# Patient Record
Sex: Female | Born: 1975 | Race: Black or African American | Hispanic: No | Marital: Single | Smoking: Former smoker
Health system: Southern US, Community
[De-identification: ages and names within clinical notes are randomized; demographics above are authoritative.]

## PROBLEM LIST (undated history)

## (undated) DIAGNOSIS — J40 Bronchitis, not specified as acute or chronic: Secondary | ICD-10-CM

## (undated) DIAGNOSIS — E119 Type 2 diabetes mellitus without complications: Secondary | ICD-10-CM

## (undated) DIAGNOSIS — F329 Major depressive disorder, single episode, unspecified: Secondary | ICD-10-CM

## (undated) DIAGNOSIS — L0291 Cutaneous abscess, unspecified: Secondary | ICD-10-CM

## (undated) DIAGNOSIS — R519 Headache, unspecified: Secondary | ICD-10-CM

## (undated) DIAGNOSIS — M549 Dorsalgia, unspecified: Secondary | ICD-10-CM

## (undated) DIAGNOSIS — I10 Essential (primary) hypertension: Secondary | ICD-10-CM

## (undated) DIAGNOSIS — J189 Pneumonia, unspecified organism: Secondary | ICD-10-CM

## (undated) DIAGNOSIS — K219 Gastro-esophageal reflux disease without esophagitis: Secondary | ICD-10-CM

## (undated) DIAGNOSIS — R51 Headache: Secondary | ICD-10-CM

## (undated) DIAGNOSIS — F419 Anxiety disorder, unspecified: Secondary | ICD-10-CM

## (undated) DIAGNOSIS — E785 Hyperlipidemia, unspecified: Secondary | ICD-10-CM

## (undated) DIAGNOSIS — F32A Depression, unspecified: Secondary | ICD-10-CM

## (undated) DIAGNOSIS — E049 Nontoxic goiter, unspecified: Secondary | ICD-10-CM

## (undated) DIAGNOSIS — I739 Peripheral vascular disease, unspecified: Secondary | ICD-10-CM

## (undated) HISTORY — DX: Hyperlipidemia, unspecified: E78.5

---

## 2014-11-04 ENCOUNTER — Inpatient Hospital Stay (HOSPITAL_COMMUNITY)
Admission: EM | Admit: 2014-11-04 | Discharge: 2014-11-07 | DRG: 872 | Disposition: A | Discharge status: Home / Self Care | Payer: Medicaid Other | Attending: Family Medicine | Admitting: Family Medicine

## 2014-11-04 ENCOUNTER — Encounter (HOSPITAL_COMMUNITY): Payer: Self-pay | Admitting: *Deleted

## 2014-11-04 DIAGNOSIS — L03315 Cellulitis of perineum: Secondary | ICD-10-CM | POA: Diagnosis present

## 2014-11-04 DIAGNOSIS — Z8249 Family history of ischemic heart disease and other diseases of the circulatory system: Secondary | ICD-10-CM

## 2014-11-04 DIAGNOSIS — F17210 Nicotine dependence, cigarettes, uncomplicated: Secondary | ICD-10-CM | POA: Diagnosis present

## 2014-11-04 DIAGNOSIS — L0291 Cutaneous abscess, unspecified: Secondary | ICD-10-CM | POA: Diagnosis present

## 2014-11-04 DIAGNOSIS — E1165 Type 2 diabetes mellitus with hyperglycemia: Secondary | ICD-10-CM | POA: Diagnosis present

## 2014-11-04 DIAGNOSIS — A419 Sepsis, unspecified organism: Principal | ICD-10-CM | POA: Diagnosis present

## 2014-11-04 DIAGNOSIS — A599 Trichomoniasis, unspecified: Secondary | ICD-10-CM | POA: Diagnosis present

## 2014-11-04 DIAGNOSIS — E785 Hyperlipidemia, unspecified: Secondary | ICD-10-CM | POA: Diagnosis present

## 2014-11-04 DIAGNOSIS — I10 Essential (primary) hypertension: Secondary | ICD-10-CM | POA: Diagnosis present

## 2014-11-04 DIAGNOSIS — L02215 Cutaneous abscess of perineum: Secondary | ICD-10-CM | POA: Diagnosis present

## 2014-11-04 DIAGNOSIS — Z9114 Patient's other noncompliance with medication regimen: Secondary | ICD-10-CM | POA: Diagnosis present

## 2014-11-04 DIAGNOSIS — L0390 Cellulitis, unspecified: Secondary | ICD-10-CM

## 2014-11-04 DIAGNOSIS — E119 Type 2 diabetes mellitus without complications: Secondary | ICD-10-CM

## 2014-11-04 HISTORY — DX: Type 2 diabetes mellitus without complications: E11.9

## 2014-11-04 HISTORY — DX: Dorsalgia, unspecified: M54.9

## 2014-11-04 HISTORY — DX: Cutaneous abscess, unspecified: L02.91

## 2014-11-04 LAB — CBG MONITORING, ED: GLUCOSE-CAPILLARY: 379 mg/dL — AB (ref 65–99)

## 2014-11-04 MED ORDER — SODIUM CHLORIDE 0.9 % IV SOLN
2000.0000 mg | Freq: Once | INTRAVENOUS | Status: AC
  Administered 2014-11-05: 2000 mg via INTRAVENOUS
  Filled 2014-11-04: qty 2000

## 2014-11-04 MED ORDER — VANCOMYCIN HCL IN DEXTROSE 1-5 GM/200ML-% IV SOLN: 1000.0000 mg | Freq: Once | INTRAVENOUS | Status: DC

## 2014-11-04 MED ORDER — ACETAMINOPHEN 500 MG PO TABS
1000.0000 mg | ORAL_TABLET | Freq: Once | ORAL | Status: AC
  Administered 2014-11-05: 1000 mg via ORAL
  Filled 2014-11-04: qty 2

## 2014-11-04 MED ORDER — SODIUM CHLORIDE 0.9 % IV BOLUS (SEPSIS)
1000.0000 mL | INTRAVENOUS | Status: AC
  Administered 2014-11-05 (×4): 1000 mL via INTRAVENOUS

## 2014-11-04 MED ORDER — PIPERACILLIN-TAZOBACTAM 3.375 G IVPB 30 MIN
3.3750 g | Freq: Once | INTRAVENOUS | Status: AC
Start: 2014-11-04 — End: 2014-11-05
  Administered 2014-11-05: 3.375 g via INTRAVENOUS
  Filled 2014-11-04: qty 50

## 2014-11-04 MED ORDER — FENTANYL CITRATE (PF) 100 MCG/2ML IJ SOLN
50.0000 ug | Freq: Once | INTRAMUSCULAR | Status: AC
  Administered 2014-11-05: 50 ug via INTRAVENOUS
  Filled 2014-11-04: qty 2

## 2014-11-04 NOTE — ED Provider Notes (Signed)
TIME SEEN: 11:36 PM   CHIEF COMPLAINT: Fever, Abscess  HPI: Debra [name] is a 39 y.o. female with a PMHx of abscesses and NIDDM who presents to the Emergency Department complaining of fever and a worsening, painful abscess on the inside of her right buttock onset 1 week ago. She reports associated loss of appetite and a mild headache. Pt is currently on her period. She has had her previous abscess on the left buttock, which was drained at the time. She has just moved to the area and does not have a PCP.    ROS: See HPI Constitutional: fever  Eyes: no drainage  ENT: no runny nose   Cardiovascular:  no chest pain  Resp: no SOB  GI: no vomiting GU: no dysuria Integumentary: no rash  Allergy: no hives  Musculoskeletal: no leg swelling  Neurological: no slurred speech ROS otherwise negative  PAST MEDICAL HISTORY/PAST SURGICAL HISTORY:  Past Medical History  Diagnosis Date  . Diabetes mellitus without complication   . Back pain   . Abscess     MEDICATIONS:  Prior to Admission medications   Medication Sig Start Date End Date Taking? Authorizing Provider  albuterol (PROVENTIL HFA;VENTOLIN HFA) 108 (90 BASE) MCG/ACT inhaler Inhale 1-2 puffs into the lungs every 6 (six) hours as needed for wheezing or shortness of breath.   Yes Historical Provider, MD  lisinopril (PRINIVIL,ZESTRIL) 10 MG tablet Take 10 mg by mouth daily.   Yes Historical Provider, MD  metFORMIN (GLUCOPHAGE) 500 MG tablet Take 1,000 mg by mouth 2 (two) times daily with a meal.   Yes Historical Provider, MD  oxyCODONE-acetaminophen (PERCOCET) 10-325 MG per tablet Take 0.5-1 tablets by mouth every 4 (four) hours as needed for pain.   Yes Historical Provider, MD  rosuvastatin (CRESTOR) 20 MG tablet Take 20 mg by mouth daily.   Yes Historical Provider, MD    ALLERGIES:  No Known Allergies  SOCIAL HISTORY:  Social History  Substance Use Topics  . Smoking status: Current Every Day Smoker  . Smokeless tobacco: Never  Used  . Alcohol Use: No    FAMILY HISTORY: No family history on file.  EXAM: BP 135/64 mmHg  Pulse 99  Temp(Src) 99.7 F (37.6 C) (Oral)  Resp 18  Ht 6' (1.829 m)  Wt 263 lb 3.2 oz (119.387 kg)  BMI 35.69 kg/m2  SpO2 100%  LMP 11/03/2014 CONSTITUTIONAL: Alert and oriented and responds appropriately to questions. Well-appearing; well-nourished, obese HEAD: Normocephalic EYES: Conjunctivae clear, PERRL ENT: normal nose; no rhinorrhea; moist mucous membranes; pharynx without lesions noted NECK: Supple, no meningismus, no LAD  CARD: you are tachycardic; S1 and S2 appreciated; no murmurs, no clicks, no rubs, no gallops RESP: Normal chest excursion without splinting or tachypnea; breath sounds clear and equal bilaterally; no wheezes, no rhonchi, no rales, no hypoxia or respiratory distress, speaking full sentences ABD/GI: Normal bowel sounds; non-distended; soft, non-tender, no rebound, no guarding, no peritoneal signs GU:  Patient has a 7 x 5 cm area of induration, erythema and warmth noted to the right perineum with cellulitis that extends up the right buttock, she has a fluctuant area central to this with foul-smelling purulent drainage, abscess and cellulitis do not appear to extend inside the vagina or rectum, she has small amount of vaginal bleeding, and the cervical os with a small amount of yellow vaginal discharge, no adnexal tenderness or fullness, no cervical motion tenderness BACK:  The back appears normal and is non-tender to palpation, there is no CVA  tenderness EXT: Normal ROM in all joints; non-tender to palpation; no edema; normal capillary refill; no cyanosis, no calf tenderness or swelling    SKIN: Normal color for age and race; warm NEURO: Moves all extremities equally, sensation to light touch intact diffusely, cranial nerves II through XII intact PSYCH: The patient's mood and manner are appropriate. Grooming and personal hygiene are appropriate.  MEDICAL DECISION  MAKING: patient here with large abscess and cellulitis to the perineal area. No subcutaneous air. She is tachycardic and febrile. Rectal temp is 100.1. She is also hyperglycemic. Given patient meets sepsis criteria we'll start broad-spectrum antibiotics and IV fluids. Labs pending.  ED PROGRESS: patient's labs show leukocytosis with left shift. Blood glucose is 392 with normal bicarbonate 9. Lactate elevated at 3.08. Urine is positive for trichomonas. Patient has been given 2 g of Flagyl. Her i-STAT hCG is slightly positive. We'll obtain a serum quantitative hCG. Patient states she was last sexually active in June. She has had normal menstrual cycles over the past several months and feels that her vaginal bleeding is due to a normal menstrual cycle now.    Patient's beta hCG is slightly positive at 10. Because of this we will cancel patient CT scan. Discussed with patient that if she is to a pregnant she is likely miscarrying giving her very low hCG since her last sexual encounter was 3 months ago. At this time I do not feel she needs an emergent transvaginal ultrasound. Have sent swabs for gonorrhea and chlamydia given she is positive for trichomonas.  Doubt that she has necrotizing fasciitis. I have opened the area of drainage of the abscess with a scalpel and explored and opened this area further. Packing has been placed. Her heart rate and lactate have improved. Her pain is well controlled. Blood glucose is also improving with IV hydration and subcutaneous insulin. Discussed with hospitalist who agrees on admission. Given her vital signs have improved will admit to medical bed.     EKG Interpretation  Date/Time:  Sunday November 05 2014 00:05:27 EDT Ventricular Rate:  109 PR Interval:  139 QRS Duration: 82 QT Interval:  311 QTC Calculation: 419 R Axis:   88 Text Interpretation:  Sinus tachycardia Confirmed by WARD,  DO, KRISTEN ([id number]) on 11/05/2014 1:01:20 AM        CRITICAL  CARE Performed by: Raelyn Number   Total critical care time: 45 minutes  Critical care time was exclusive of separately billable procedures and treating other patients.  Critical care was necessary to treat or prevent imminent or life-threatening deterioration.  Critical care was time spent personally by me on the following activities: development of treatment plan with patient and/or surrogate as well as nursing, discussions with consultants, evaluation of patient's response to treatment, examination of patient, obtaining history from patient or surrogate, ordering and performing treatments and interventions, ordering and review of laboratory studies, ordering and review of radiographic studies, pulse oximetry and re-evaluation of patient's condition.     INCISION AND DRAINAGE Performed by: Raelyn Number Consent: Verbal consent obtained. Risks and benefits: risks, benefits and alternatives were discussed Type: abscess  Body area: right perineum  Anesthesia: local infiltration  Incision was made with a scalpel.  Local anesthetic: lidocaine 2% with epinephrine  Anesthetic total: 10 ml  Complexity: complex Blunt dissection to break up loculations  Drainage: purulent  Drainage amount: small  Packing material: 1/4 in iodoform gauze  Patient tolerance: Patient tolerated the procedure well with no immediate complications.  I personally performed the services described in this documentation, which was scribed in my presence. The recorded information has been reviewed and is accurate.   Layla Maw Ward, DO 11/05/14 [phone]

## 2014-11-04 NOTE — ED Notes (Signed)
Patient presents with fever and states she has an abcess on right right buttock.  Patient has a "sick" smell to her as she entered the room

## 2014-11-05 ENCOUNTER — Encounter (HOSPITAL_COMMUNITY): Payer: Self-pay | Admitting: *Deleted

## 2014-11-05 DIAGNOSIS — L0291 Cutaneous abscess, unspecified: Secondary | ICD-10-CM | POA: Diagnosis present

## 2014-11-05 DIAGNOSIS — R509 Fever, unspecified: Secondary | ICD-10-CM | POA: Diagnosis present

## 2014-11-05 DIAGNOSIS — L0390 Cellulitis, unspecified: Secondary | ICD-10-CM | POA: Diagnosis not present

## 2014-11-05 DIAGNOSIS — L02215 Cutaneous abscess of perineum: Secondary | ICD-10-CM | POA: Diagnosis present

## 2014-11-05 DIAGNOSIS — A599 Trichomoniasis, unspecified: Secondary | ICD-10-CM | POA: Diagnosis present

## 2014-11-05 DIAGNOSIS — A419 Sepsis, unspecified organism: Secondary | ICD-10-CM | POA: Diagnosis not present

## 2014-11-05 DIAGNOSIS — Z8249 Family history of ischemic heart disease and other diseases of the circulatory system: Secondary | ICD-10-CM | POA: Diagnosis not present

## 2014-11-05 DIAGNOSIS — L03315 Cellulitis of perineum: Secondary | ICD-10-CM | POA: Diagnosis present

## 2014-11-05 DIAGNOSIS — F17210 Nicotine dependence, cigarettes, uncomplicated: Secondary | ICD-10-CM | POA: Diagnosis present

## 2014-11-05 DIAGNOSIS — Z9114 Patient's other noncompliance with medication regimen: Secondary | ICD-10-CM | POA: Diagnosis present

## 2014-11-05 DIAGNOSIS — E119 Type 2 diabetes mellitus without complications: Secondary | ICD-10-CM

## 2014-11-05 DIAGNOSIS — E1165 Type 2 diabetes mellitus with hyperglycemia: Secondary | ICD-10-CM | POA: Diagnosis present

## 2014-11-05 DIAGNOSIS — I10 Essential (primary) hypertension: Secondary | ICD-10-CM | POA: Diagnosis present

## 2014-11-05 DIAGNOSIS — E785 Hyperlipidemia, unspecified: Secondary | ICD-10-CM | POA: Diagnosis present

## 2014-11-05 LAB — GLUCOSE, CAPILLARY
GLUCOSE-CAPILLARY: 346 mg/dL — AB (ref 65–99)
GLUCOSE-CAPILLARY: 235 mg/dL — AB (ref 65–99)
Glucose-Capillary: 246 mg/dL — ABNORMAL HIGH (ref 65–99)
Glucose-Capillary: 244 mg/dL — ABNORMAL HIGH (ref 65–99)

## 2014-11-05 LAB — CBC WITH DIFFERENTIAL/PLATELET
BASOS ABS: 0 10*3/uL (ref 0.0–0.1)
BASOS PCT: 0 % (ref 0–1)
EOS PCT: 0 % (ref 0–5)
Eosinophils Absolute: 0 10*3/uL (ref 0.0–0.7)
HCT: 36.9 % (ref 36.0–46.0)
Hemoglobin: 13 g/dL (ref 12.0–15.0)
LYMPHS PCT: 10 % — AB (ref 12–46)
Lymphs Abs: 1.7 10*3/uL (ref 0.7–4.0)
MCH: 31.7 pg (ref 26.0–34.0)
MCHC: 35.2 g/dL (ref 30.0–36.0)
MCV: 90 fL (ref 78.0–100.0)
MONO ABS: 0.9 10*3/uL (ref 0.1–1.0)
Monocytes Relative: 5 % (ref 3–12)
NEUTROS ABS: 13.6 10*3/uL — AB (ref 1.7–7.7)
Neutrophils Relative %: 85 % — ABNORMAL HIGH (ref 43–77)
PLATELETS: 350 10*3/uL (ref 150–400)
RBC: 4.1 MIL/uL (ref 3.87–5.11)
RDW: 12.2 % (ref 11.5–15.5)
WBC: 16.3 10*3/uL — AB (ref 4.0–10.5)

## 2014-11-05 LAB — WET PREP, GENITAL
CLUE CELLS WET PREP: NONE SEEN
YEAST WET PREP: NONE SEEN

## 2014-11-05 LAB — URINE MICROSCOPIC-ADD ON

## 2014-11-05 LAB — COMPREHENSIVE METABOLIC PANEL
ALBUMIN: 2.8 g/dL — AB (ref 3.5–5.0)
ALK PHOS: 121 U/L (ref 38–126)
ALT: 16 U/L (ref 14–54)
ANION GAP: 10 (ref 5–15)
AST: 16 U/L (ref 15–41)
BILIRUBIN TOTAL: 0.7 mg/dL (ref 0.3–1.2)
BUN: 8 mg/dL (ref 6–20)
CALCIUM: 9.3 mg/dL (ref 8.9–10.3)
CO2: 25 mmol/L (ref 22–32)
Chloride: 94 mmol/L — ABNORMAL LOW (ref 101–111)
Creatinine, Ser: 0.74 mg/dL (ref 0.44–1.00)
GFR calc Af Amer: >60 mL/min (ref >60)
GLUCOSE: 392 mg/dL — AB (ref 65–99)
POTASSIUM: 3.3 mmol/L — AB (ref 3.5–5.1)
Sodium: 129 mmol/L — ABNORMAL LOW (ref 135–145)
TOTAL PROTEIN: 7.1 g/dL (ref 6.5–8.1)

## 2014-11-05 LAB — I-STAT BETA HCG BLOOD, ED (MC, WL, AP ONLY): HCG, QUANTITATIVE: 25.7 m[IU]/mL — AB (ref <5)

## 2014-11-05 LAB — URINALYSIS, ROUTINE W REFLEX MICROSCOPIC
BILIRUBIN URINE: NEGATIVE
Glucose, UA: >1000 mg/dL — AB
KETONES UR: 40 mg/dL — AB
NITRITE: NEGATIVE
PROTEIN: NEGATIVE mg/dL
Specific Gravity, Urine: 1.041 — ABNORMAL HIGH (ref 1.005–1.030)
UROBILINOGEN UA: 1 mg/dL (ref 0.0–1.0)
pH: 5.5 (ref 5.0–8.0)

## 2014-11-05 LAB — I-STAT CG4 LACTIC ACID, ED
LACTIC ACID, VENOUS: 1.25 mmol/L (ref 0.5–2.0)
LACTIC ACID, VENOUS: 3.08 mmol/L — AB (ref 0.5–2.0)

## 2014-11-05 LAB — HCG, QUANTITATIVE, PREGNANCY: hCG, Beta Chain, Quant, S: 10 m[IU]/mL — ABNORMAL HIGH (ref <5)

## 2014-11-05 LAB — CBG MONITORING, ED: Glucose-Capillary: 269 mg/dL — ABNORMAL HIGH (ref 65–99)

## 2014-11-05 MED ORDER — VANCOMYCIN HCL IN DEXTROSE 1-5 GM/200ML-% IV SOLN
1000.0000 mg | Freq: Once | INTRAVENOUS | Status: AC
  Administered 2014-11-05: 1000 mg via INTRAVENOUS
  Filled 2014-11-05: qty 200

## 2014-11-05 MED ORDER — ENOXAPARIN SODIUM 40 MG/0.4ML ~~LOC~~ SOLN: 40.0000 mg | SUBCUTANEOUS | Status: DC

## 2014-11-05 MED ORDER — POTASSIUM CHLORIDE CRYS ER 20 MEQ PO TBCR
40.0000 meq | EXTENDED_RELEASE_TABLET | Freq: Once | ORAL | Status: AC
  Administered 2014-11-05: 40 meq via ORAL
  Filled 2014-11-05: qty 2

## 2014-11-05 MED ORDER — LIDOCAINE-EPINEPHRINE (PF) 2 %-1:200000 IJ SOLN
10.0000 mL | Freq: Once | INTRAMUSCULAR | Status: AC
  Administered 2014-11-05: 10 mL via INTRADERMAL
  Filled 2014-11-05: qty 20

## 2014-11-05 MED ORDER — ROSUVASTATIN CALCIUM 20 MG PO TABS
20.0000 mg | ORAL_TABLET | Freq: Every day | ORAL | Status: DC
  Administered 2014-11-05 – 2014-11-07 (×3): 20 mg via ORAL
  Filled 2014-11-05 (×4): qty 1

## 2014-11-05 MED ORDER — MORPHINE SULFATE (PF) 2 MG/ML IV SOLN
1.0000 mg | INTRAVENOUS | Status: DC | PRN
  Administered 2014-11-05 – 2014-11-07 (×6): 2 mg via INTRAVENOUS
  Filled 2014-11-05 (×6): qty 1

## 2014-11-05 MED ORDER — SODIUM CHLORIDE 0.9 % IV SOLN
INTRAVENOUS | Status: DC
  Administered 2014-11-05 – 2014-11-07 (×7): via INTRAVENOUS

## 2014-11-05 MED ORDER — ENSURE ENLIVE PO LIQD
237.0000 mL | Freq: Two times a day (BID) | ORAL | Status: DC
  Administered 2014-11-05 – 2014-11-06 (×2): 237 mL via ORAL

## 2014-11-05 MED ORDER — INSULIN ASPART 100 UNIT/ML ~~LOC~~ SOLN
0.0000 [IU] | Freq: Three times a day (TID) | SUBCUTANEOUS | Status: DC
  Administered 2014-11-05 (×2): 3 [IU] via SUBCUTANEOUS
  Administered 2014-11-05 – 2014-11-06 (×2): 9 [IU] via SUBCUTANEOUS
  Administered 2014-11-06: 5 [IU] via SUBCUTANEOUS
  Administered 2014-11-06: 3 [IU] via SUBCUTANEOUS
  Administered 2014-11-07: 5 [IU] via SUBCUTANEOUS
  Administered 2014-11-07: 3 [IU] via SUBCUTANEOUS

## 2014-11-05 MED ORDER — POTASSIUM CHLORIDE CRYS ER 20 MEQ PO TBCR
20.0000 meq | EXTENDED_RELEASE_TABLET | Freq: Once | ORAL | Status: AC
Start: 2014-11-05 — End: 2014-11-05
  Administered 2014-11-05: 20 meq via ORAL
  Filled 2014-11-05: qty 1

## 2014-11-05 MED ORDER — ALBUTEROL SULFATE (2.5 MG/3ML) 0.083% IN NEBU: 2.5000 mg | INHALATION_SOLUTION | Freq: Four times a day (QID) | RESPIRATORY_TRACT | Status: DC | PRN

## 2014-11-05 MED ORDER — ALBUTEROL SULFATE HFA 108 (90 BASE) MCG/ACT IN AERS: 1.0000 | INHALATION_SPRAY | Freq: Four times a day (QID) | RESPIRATORY_TRACT | Status: DC | PRN

## 2014-11-05 MED ORDER — MORPHINE SULFATE (PF) 4 MG/ML IV SOLN
4.0000 mg | Freq: Once | INTRAVENOUS | Status: AC
  Administered 2014-11-05: 4 mg via INTRAVENOUS
  Filled 2014-11-05: qty 1

## 2014-11-05 MED ORDER — METRONIDAZOLE 500 MG PO TABS
2000.0000 mg | ORAL_TABLET | Freq: Once | ORAL | Status: AC
  Administered 2014-11-05: 2000 mg via ORAL
  Filled 2014-11-05: qty 4

## 2014-11-05 MED ORDER — ENOXAPARIN SODIUM 60 MG/0.6ML ~~LOC~~ SOLN
0.5000 mg/kg | SUBCUTANEOUS | Status: DC
  Administered 2014-11-05 – 2014-11-07 (×3): 60 mg via SUBCUTANEOUS
  Filled 2014-11-05 (×3): qty 0.6

## 2014-11-05 MED ORDER — PIPERACILLIN-TAZOBACTAM 3.375 G IVPB
3.3750 g | Freq: Three times a day (TID) | INTRAVENOUS | Status: DC
  Administered 2014-11-05 – 2014-11-06 (×5): 3.375 g via INTRAVENOUS
  Filled 2014-11-05 (×6): qty 50

## 2014-11-05 MED ORDER — VANCOMYCIN HCL IN DEXTROSE 1-5 GM/200ML-% IV SOLN
1000.0000 mg | Freq: Three times a day (TID) | INTRAVENOUS | Status: DC
  Administered 2014-11-05 – 2014-11-06 (×3): 1000 mg via INTRAVENOUS
  Filled 2014-11-05 (×4): qty 200

## 2014-11-05 NOTE — H&P (Signed)
History and Physical  Debra [name] UJW:[id number] DOB: 08-15-1975 DOA: 11/04/2014  PCP: No primary care provider on file.   Chief Complaint:   History of Present Illness:  Patient is a 39 year old female with history of HTN and DM who came with cc of fever and pain/drianage in her abscess. This has been going on for a week but she has been pushing through and tolerating it to be able to take her daughter to school. She said there was pus and/or blood draining from the abscess. She was not sure as it coincided with her period. She has fever and chills. She had constipation but denied diarrhea. She had mild abdominal pain/cramping. She had had the abscess frequently with drainage eery 3=6 months.    Review of Systems:  CONSTITUTIONAL:  No night sweats.  No fatigue, malaise, lethargy.  fever or chills. Eyes:  No visual changes.  No eye pain.  No eye discharge.   ENT:    No epistaxis.  No sinus pain.  No sore throat.  No ear pain.  No congestion. RESPIRATORY:  No cough.  No wheeze.  No hemoptysis.  No shortness of breath. CARDIOVASCULAR:  No chest pains.  No palpitations. GASTROINTESTINAL:  No abdominal pain.  No nausea or vomiting.  No diarrhea . constipation.  No hematemesis.  No hematochezia.  No melena. GENITOURINARY:  No urgency.  No frequency.  No dysuria.  No hematuria.  No obstructive symptoms.  No discharge.  No pain.  No significant abnormal bleeding. MUSCULOSKELETAL:  No musculoskeletal pain.  No joint swelling.  No arthritis. NEUROLOGICAL:  No confusion.  No weakness. No headache. No seizure. PSYCHIATRIC:  No depression. No anxiety. No suicidal ideation. SKIN:  No rashes.  No lesions.  wounds. ENDOCRINE:  No unexplained weight loss.  No polydipsia.  No polyuria.  No polyphagia. HEMATOLOGIC:  No anemia.  No purpura.  No petechiae.  No bleeding.  ALLERGIC AND IMMUNOLOGIC:  No pruritus.  No swelling Other:  Past Medical and Surgical History:   Past Medical History    Diagnosis Date  . Diabetes mellitus without complication   . Back pain   . Abscess    History reviewed. No pertinent past surgical history.  Social History:   reports that she has been smoking.  She has never used smokeless tobacco. She reports that she does not drink alcohol or use illicit drugs.   No Known Allergies  FH: HTN  Prior to Admission medications   Medication Sig Start Date End Date Taking? Authorizing Provider  albuterol (PROVENTIL HFA;VENTOLIN HFA) 108 (90 BASE) MCG/ACT inhaler Inhale 1-2 puffs into the lungs every 6 (six) hours as needed for wheezing or shortness of breath.   Yes Historical Provider, MD  lisinopril (PRINIVIL,ZESTRIL) 10 MG tablet Take 10 mg by mouth daily.   Yes Historical Provider, MD  metFORMIN (GLUCOPHAGE) 500 MG tablet Take 1,000 mg by mouth 2 (two) times daily with a meal.   Yes Historical Provider, MD  oxyCODONE-acetaminophen (PERCOCET) 10-325 MG per tablet Take 0.5-1 tablets by mouth every 4 (four) hours as needed for pain.   Yes Historical Provider, MD  rosuvastatin (CRESTOR) 20 MG tablet Take 20 mg by mouth daily.   Yes Historical Provider, MD    Physical Exam: BP 132/67 mmHg  Pulse 102  Temp(Src) 99.7 F (37.6 C) (Oral)  Resp 25  Ht 6' (1.829 m)  Wt 119.387 kg (263 lb 3.2 oz)  BMI 35.69 kg/m2  SpO2 97%  LMP 11/03/2014  GENERAL : mild acute distress.  HEAD: normocephalic. EYES: PERRL, EOMI. Fundi normal, vision is grossly intact. NOSE: No nasal discharge. THROAT: Oral cavity and pharynx normal. No inflammation, swelling, exudate, or lesions.  NECK: Neck supple. CARDIAC: Normal S1 and S2. No S3, S4 or murmurs. Rhythm is regular. There is no peripheral edema, cyanosis or pallor. LUNGS: Clear to auscultation and percussion without rales, rhonchi, wheezing or diminished breath sounds. ABDOMEN: Positive bowel sounds. Soft, nondistended, nontender. No guarding or rebound. No masses. NEUROLOGICAL: The mental examination revealed the  patient was oriented to person, place, and time.CN II-XII intact. Strength and sensation symmetric and intact throughout. Reflexes 2+ throughout. Cerebellar testing normal. SKIN: right perineal abscess s/p drainage by ED physician  PSYCHIATRIC:  The patient was able to demonstrate good judgement and reason, without hallucinations, abnormal affect or abnormal behaviors during the examination. Patient is not suicidal.          Labs on Admission:  Reviewed.   Radiological Exams on Admission: No results found.    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

## 2014-11-05 NOTE — Progress Notes (Signed)
ANTIBIOTIC CONSULT NOTE - INITIAL  Pharmacy Consult for Vancomycin/Zosyn  Indication: Wound infection  No Known Allergies  Patient Measurements: Height: 6' (182.9 cm) Weight: 263 lb 3.2 oz (119.387 kg) IBW/kg (Calculated) : 73.1  Vital Signs: Temp: 99.7 F (37.6 C) (09/03 2137) Temp Source: Oral (09/03 2137) BP: 111/55 mmHg (09/04 0200) Pulse Rate: 99 (09/04 0200)  Labs:  Recent Labs  11/05/14 0045  WBC 16.3*  HGB 13.0  PLT 350  CREATININE 0.74   Estimated Creatinine Clearance: 137.9 mL/min (by C-G formula based on Cr of 0.74).  Medical History: Past Medical History  Diagnosis Date  . Diabetes mellitus without complication   . Back pain   . Abscess     Assessment: 39 y/o F with right buttock abscess, pt has hx of abscesses, WBC elevated, renal function good, other meds/labs reviewed.   Goal of Therapy:  Vancomycin trough level 15-20 mcg/ml  Plan:  -Vancomycin 1000 mg IV q8h -Zosyn 3.375G IV q8h to be infused over 4 hours -Trend WBC, temp, renal function  -Drug levels as indicated   Abran Duke 11/05/2014,2:12 AM

## 2014-11-05 NOTE — Progress Notes (Signed)
Patient seen and evaluated earlier the same by my associate. Please refer to H&P for details regarding assessment and plan.  Patient seen and evaluated Gen.: Patient in no acute distress, alert and awake Cardiovascular: S1 and S2 within normal limits, no rubs Pulmonary: No increased work of breathing, equal chest rise, no wheezes Peritoneum: Patient has abscess which has been packed. No purulent discharge noted  Will reassess patient next am. Continue broad spectrum antibiotics at this juncture. F/u with blood cultures  Basil Blakesley, Pamala Hurry

## 2014-11-05 NOTE — ED Notes (Signed)
CBG- 379 

## 2014-11-05 NOTE — Progress Notes (Signed)
Called pharmacy to push back the time for her Vancomycin because the zosyn will not be finished until 1918

## 2014-11-05 NOTE — Progress Notes (Signed)
Utilization Review Completed.Jarae Nemmers T9/06/2014  

## 2014-11-06 LAB — URINE CULTURE: Culture: 5000

## 2014-11-06 LAB — GLUCOSE, CAPILLARY
Glucose-Capillary: 266 mg/dL — ABNORMAL HIGH (ref 65–99)
Glucose-Capillary: 239 mg/dL — ABNORMAL HIGH (ref 65–99)
Glucose-Capillary: 371 mg/dL — ABNORMAL HIGH (ref 65–99)
Glucose-Capillary: 227 mg/dL — ABNORMAL HIGH (ref 65–99)

## 2014-11-06 MED ORDER — OXYCODONE HCL ER 15 MG PO T12A
15.0000 mg | EXTENDED_RELEASE_TABLET | Freq: Two times a day (BID) | ORAL | Status: DC
  Administered 2014-11-06 – 2014-11-07 (×2): 15 mg via ORAL
  Filled 2014-11-06 (×2): qty 1

## 2014-11-06 MED ORDER — ENSURE ENLIVE PO LIQD
237.0000 mL | Freq: Every day | ORAL | Status: DC
  Administered 2014-11-07: 237 mL via ORAL

## 2014-11-06 MED ORDER — SULFAMETHOXAZOLE-TRIMETHOPRIM 800-160 MG PO TABS
2.0000 | ORAL_TABLET | Freq: Two times a day (BID) | ORAL | Status: DC
  Administered 2014-11-06 – 2014-11-07 (×2): 2 via ORAL
  Filled 2014-11-06 (×2): qty 2

## 2014-11-06 MED ORDER — GLUCERNA SHAKE PO LIQD
237.0000 mL | Freq: Every day | ORAL | Status: DC
  Administered 2014-11-07: 237 mL via ORAL

## 2014-11-06 MED ORDER — SULFAMETHOXAZOLE-TRIMETHOPRIM 800-160 MG PO TABS: 1.0000 | ORAL_TABLET | Freq: Two times a day (BID) | ORAL | Status: DC

## 2014-11-06 NOTE — Progress Notes (Signed)
Inpatient Diabetes Program Recommendations  AACE/ADA: New Consensus Statement on Inpatient Glycemic Control (2013)  Target Ranges:  Prepandial:   less than 140 mg/dL      Peak postprandial:   less than 180 mg/dL (1-2 hours)      Critically ill patients:  140 - 180 mg/dL   GLYCEMIC CONTROL REVIEW - Consult  Diabetes history: DM2 Outpatient Diabetes medications: metformin 1000 mg bid Current orders for Inpatient glycemic control: Novolog sensitive tidwc  39 year old female with history of HTN and DM who came with cc of fever and pain/drianage in her abscess. CBGs running 200-300s. Awaiting HgbA1C results.  Results for Debra [name], Debra [name] (MRN [id number]) as of 11/06/2014 13:11  Ref. Range 11/05/2014 12:14 11/05/2014 16:58 11/05/2014 23:10 11/06/2014 07:39 11/06/2014 11:26  Glucose-Capillary Latest Ref Range: 65-99 mg/dL 086 (H) 578 (H) 469 (H) 266 (H) 239 (H)    Inpatient Diabetes Program Recommendations Insulin - Basal: Consider addition of Lantus 20 units QHS Correction (SSI): Increase Novolog to resistant tidwc and hs HgbA1C: Pending  Note: May want to reconsider addition of supplements d/t increased po intake and weight hx. No deficiencies noted.  Will continue to follow. Thank you. Ailene Ards, RD, LDN, CDE Inpatient Diabetes Coordinator [phone]

## 2014-11-06 NOTE — Progress Notes (Signed)
[facility] PROGRESS NOTE  Debra [name] NFA:[id number] DOB: May 01, 1975 DOA: 11/04/2014 PCP: No primary care provider on file.  Assessment/Plan: Principal Problem:   Abscess and cellulitis - s/p I and D - Was on Vanc and Zosyn initially - Will transition to Bactrim DS at higher dose given weight  Active Problems:   Diabetes - SSI    Sepsis - Resolving on antibiotics - given improvement in condition and resolution of sepsis physiology will transition to oral antibiotics  Code Status: full Family Communication: No family at bedside Disposition Plan: Pending improvement in condition.   Consultants:  none  Procedures:  I and D  Antibiotics:  Bactrim DS  HPI/Subjective: Pt has no new complaints. Feels better  Objective: Filed Vitals:   11/06/14 1345  BP: 118/62  Pulse: 90  Temp: 98.7 F (37.1 C)  Resp: 18    Intake/Output Summary (Last 24 hours) at 11/06/14 1641 Last data filed at 11/06/14 1500  Gross per 24 hour  Intake 4263.67 ml  Output   1450 ml  Net 2813.67 ml   Filed Weights   11/04/14 2142 11/05/14 0355  Weight: 119.387 kg (263 lb 3.2 oz) 121.11 kg (267 lb)    Exam:   General:  Pt in nad, alert and awake  Cardiovascular: rrr, no mrg  Respiratory: cta bl, no wheezes  Abdomen: soft, nt, nd  Musculoskeletal: no cyanosis or clubbing   Data Reviewed: Basic Metabolic Panel:  Recent Labs Lab 11/05/14 0045  NA 129*  K 3.3*  CL 94*  CO2 25  GLUCOSE 392*  BUN 8  CREATININE 0.74  CALCIUM 9.3   Liver Function Tests:  Recent Labs Lab 11/05/14 0045  AST 16  ALT 16  ALKPHOS 121  BILITOT 0.7  PROT 7.1  ALBUMIN 2.8*   No results for input(s): LIPASE, AMYLASE in the last 168 hours. No results for input(s): AMMONIA in the last 168 hours. CBC:  Recent Labs Lab 11/05/14 0045  WBC 16.3*  NEUTROABS 13.6*  HGB 13.0  HCT 36.9  MCV 90.0  PLT 350   Cardiac Enzymes: No results for input(s): CKTOTAL, CKMB, CKMBINDEX,  TROPONINI in the last 168 hours. BNP (last 3 results) No results for input(s): BNP in the last 8760 hours.  ProBNP (last 3 results) No results for input(s): PROBNP in the last 8760 hours.  CBG:  Recent Labs Lab 11/05/14 1214 11/05/14 1658 11/05/14 2310 11/06/14 0739 11/06/14 1126  GLUCAP 346* 244* 235* 266* 239*    Recent Results (from the past 240 hour(s))  Blood Culture (routine x 2)     Status: None (Preliminary result)   Collection Time: 11/04/14 11:55 PM  Result Value Ref Range Status   Specimen Description BLOOD RIGHT ARM  Final   Special Requests BOTTLES DRAWN AEROBIC AND ANAEROBIC 10CC   Final   Culture NO GROWTH 1 DAY  Final   Report Status PENDING  Incomplete  Blood Culture (routine x 2)     Status: None (Preliminary result)   Collection Time: 11/05/14 12:01 AM  Result Value Ref Range Status   Specimen Description BLOOD LEFT ARM  Final   Special Requests BOTTLES DRAWN AEROBIC AND ANAEROBIC 10CC  Final   Culture NO GROWTH 1 DAY  Final   Report Status PENDING  Incomplete  Urine culture     Status: None   Collection Time: 11/05/14 12:36 AM  Result Value Ref Range Status   Specimen Description URINE, CLEAN CATCH  Final   Special Requests NONE  Final   Culture 5,000 COLONIES/mL INSIGNIFICANT GROWTH  Final   Report Status 11/06/2014 FINAL  Final  Culture, routine-abscess     Status: None (Preliminary result)   Collection Time: 11/05/14 12:39 AM  Result Value Ref Range Status   Specimen Description ABSCESS RIGHT BUTTOCKS  Final   Special Requests NONE  Final   Gram Stain   Final    NO WBC SEEN NO SQUAMOUS EPITHELIAL CELLS SEEN MODERATE GRAM POSITIVE COCCI IN PAIRS FEW GRAM NEGATIVE RODS Performed at [facility]    Culture   Final    NO GROWTH 1 DAY Performed at [facility]    Report Status PENDING  Incomplete  Wet prep, genital     Status: Abnormal   Collection Time: 11/05/14  3:12 AM  Result Value Ref Range Status   Yeast Wet Prep HPF  POC NONE SEEN NONE SEEN Final   Trich, Wet Prep FEW (A) NONE SEEN Final   Clue Cells Wet Prep HPF POC NONE SEEN NONE SEEN Final   WBC, Wet Prep HPF POC MANY (A) NONE SEEN Final     Studies: No results found.  Scheduled Meds: . enoxaparin (LOVENOX) injection  0.5 mg/kg Subcutaneous Q24H  . [START ON 11/07/2014] feeding supplement (ENSURE ENLIVE)  237 mL Oral Q1500  . [START ON 11/07/2014] feeding supplement (GLUCERNA SHAKE)  237 mL Oral Daily  . insulin aspart  0-9 Units Subcutaneous TID WC  . OxyCODONE  15 mg Oral Q12H  . rosuvastatin  20 mg Oral Daily  . sulfamethoxazole-trimethoprim  2 tablet Oral Q12H   Continuous Infusions: . sodium chloride 125 mL/hr at 11/06/14 1617     Time spent: > 35 minutes    Penny Pia  [facility] Pager [phone]. If 7PM-7AM, please contact night-coverage at [email or web address], password [facility] 11/06/2014, 4:41 PM  LOS: 1 day

## 2014-11-06 NOTE — Progress Notes (Signed)
Initial Nutrition Assessment  DOCUMENTATION CODES:   Obesity unspecified  INTERVENTION:   Provide Ensure Enlive po once daily, each supplement provides 350 kcal and 20 grams of protein.  Provide Glucerna Shake po once daily, each supplement provides 220 kcal and 10 grams of protein  Encourage adequate PO intake.   NUTRITION DIAGNOSIS:   Increased nutrient needs related to  (healing) as evidenced by estimated needs.  GOAL:   Patient will meet greater than or equal to 90% of their needs  MONITOR:   PO intake, Supplement acceptance, Weight trends, Labs, I & O's  REASON FOR ASSESSMENT:   Consult  (weight loss)  ASSESSMENT:   39 year old female with history of HTN and DM who came with cc of fever and pain/drianage in her abscess. She said there was pus and/or blood draining from the abscess. She was not sure as it coincided with her period. She has fever and chills. She had constipation but denied diarrhea. She had mild abdominal pain/cramping.  Pt reports appetite has been improving since admission. Meal completion has been varied from 25-100%, with intake of 100% his AM. PTA pt reports having a lack of appetite for 1 week. Pt reports she would try to consume at least 3 meals a day however has only been snacking on foods. Pt reports weight loss, however is associated with healthier diet changes. Pt reports weight used to be ~420 lbs prior to being diagnosed with DM. Pt currently has Ensure ordered and reports she would like to continue with them. RD to modify orders.   Pt with no observed significant fat or muscle mass loss.   Labs and medications reviewed.   Diet Order:   Carb Modifed  Skin:  Reviewed, no issues  Last BM:  9/4  Height:   Ht Readings from Last 1 Encounters:  11/05/14 6' (1.829 m)    Weight:   Wt Readings from Last 1 Encounters:  11/05/14 267 lb (121.11 kg)    Ideal Body Weight:  75 kg  BMI:  Body mass index is 36.2 kg/(m^2).  Estimated  Nutritional Needs:   Kcal:  2000-2300  Protein:  125-140 lbs  Fluid:  2-2.3 L/day  EDUCATION NEEDS:   No education needs identified at this time  Roslyn Smiling, MS, RD, LDN Pager # [phone] After hours/ weekend pager # [phone]

## 2014-11-06 NOTE — Care Management Note (Signed)
Case Management Note  Patient Details  Name: Debra [name] MRN: [id number] Date of Birth: 07-18-1975  Subjective/Objective:                    Action/Plan:  Patient has moved to [facility] from [place] , has Medicaid through [place] . Patient states she does have prescription coverage in [place] , however, she goes to [place] twice a year to see her doctor . Patient is trying to get her Medicaid changed to [employer] . Gave patient information on [facility] and [facility] . Same closed today , unable to call to arrange hospital follow up , will attempt tomorrow , patient has contact information also .  Expected Discharge Date:                  Expected Discharge Plan:  Home/Self Care  In-House Referral:     Discharge planning Services  CM Consult  Post Acute Care Choice:    Choice offered to:     DME Arranged:    DME Agency:     HH Arranged:    HH Agency:     Status of Service:  In process, will continue to follow  Medicare Important Message Given:    Date Medicare IM Given:    Medicare IM give by:    Date Additional Medicare IM Given:    Additional Medicare Important Message give by:     If discussed at Long Length of Stay Meetings, dates discussed:    Additional Comments:  Kingsley Plan, RN 11/06/2014, 2:55 PM

## 2014-11-07 LAB — HEMOGLOBIN A1C
Hgb A1c MFr Bld: 11.2 % — ABNORMAL HIGH (ref 4.8–5.6)
Mean Plasma Glucose: 275 mg/dL

## 2014-11-07 LAB — GLUCOSE, CAPILLARY
GLUCOSE-CAPILLARY: 222 mg/dL — AB (ref 65–99)
Glucose-Capillary: 262 mg/dL — ABNORMAL HIGH (ref 65–99)

## 2014-11-07 LAB — CBC
HCT: 30.3 % — ABNORMAL LOW (ref 36.0–46.0)
HEMOGLOBIN: 10.4 g/dL — AB (ref 12.0–15.0)
MCH: 31.1 pg (ref 26.0–34.0)
MCHC: 34.3 g/dL (ref 30.0–36.0)
MCV: 90.7 fL (ref 78.0–100.0)
Platelets: 315 10*3/uL (ref 150–400)
RBC: 3.34 MIL/uL — ABNORMAL LOW (ref 3.87–5.11)
RDW: 12.5 % (ref 11.5–15.5)
WBC: 5.7 10*3/uL (ref 4.0–10.5)

## 2014-11-07 LAB — BASIC METABOLIC PANEL
Anion gap: 7 (ref 5–15)
BUN: 6 mg/dL (ref 6–20)
CALCIUM: 8.1 mg/dL — AB (ref 8.9–10.3)
CHLORIDE: 101 mmol/L (ref 101–111)
CO2: 25 mmol/L (ref 22–32)
CREATININE: 0.46 mg/dL (ref 0.44–1.00)
GFR calc Af Amer: >60 mL/min (ref >60)
GFR calc non Af Amer: >60 mL/min (ref >60)
Glucose, Bld: 249 mg/dL — ABNORMAL HIGH (ref 65–99)
Potassium: 3.7 mmol/L (ref 3.5–5.1)
SODIUM: 133 mmol/L — AB (ref 135–145)

## 2014-11-07 LAB — GC/CHLAMYDIA PROBE AMP (~~LOC~~) NOT AT ARMC
Chlamydia: NEGATIVE
Neisseria Gonorrhea: NEGATIVE

## 2014-11-07 MED ORDER — INSULIN ASPART 100 UNIT/ML ~~LOC~~ SOLN: 0.0000 [IU] | Freq: Every day | SUBCUTANEOUS | Status: DC

## 2014-11-07 MED ORDER — OXYCODONE HCL 5 MG PO TABS
5.0000 mg | ORAL_TABLET | Freq: Four times a day (QID) | ORAL | Status: DC | PRN
  Administered 2014-11-07: 5 mg via ORAL
  Filled 2014-11-07: qty 1

## 2014-11-07 MED ORDER — GLIPIZIDE 5 MG PO TABS: 5.0000 mg | ORAL_TABLET | Freq: Every day | ORAL | Status: DC

## 2014-11-07 MED ORDER — OXYCODONE-ACETAMINOPHEN 10-325 MG PO TABS: 1.0000 | ORAL_TABLET | ORAL | Status: DC | PRN

## 2014-11-07 MED ORDER — INSULIN ASPART 100 UNIT/ML ~~LOC~~ SOLN: 0.0000 [IU] | Freq: Three times a day (TID) | SUBCUTANEOUS | Status: DC

## 2014-11-07 MED ORDER — INSULIN GLARGINE 100 UNIT/ML ~~LOC~~ SOLN
20.0000 [IU] | Freq: Every day | SUBCUTANEOUS | Status: DC
  Filled 2014-11-07: qty 0.2

## 2014-11-07 MED ORDER — SULFAMETHOXAZOLE-TRIMETHOPRIM 800-160 MG PO TABS: 2.0000 | ORAL_TABLET | Freq: Two times a day (BID) | ORAL | Status: DC

## 2014-11-07 NOTE — Progress Notes (Signed)
Discussed discharge summary with patient. Reviewed all medications with patient. Patient received Rx. Patient ready for discharge. 

## 2014-11-07 NOTE — Discharge Summary (Signed)
Physician Discharge Summary  Debra [name] ZOX:[id number] DOB: 1975-03-21 DOA: 11/04/2014  PCP: No primary care provider on file.  Admit date: 11/04/2014 Discharge date: 11/07/2014  Time spent: > 35  minutes  Recommendations for Outpatient Follow-up:  1. Monitor blood sugars 2. Patient prefers to try oral hypoglycemic agents and diabetic diet. Offered SQ insulin but she refused 3. Treated for Trichomonas this hospital admission. 4. Patient will need routine post I&D care of abscess  Discharge Diagnoses:  Principal Problem:   Abscess and cellulitis Active Problems:   Diabetes   Sepsis   Discharge Condition: stable  Diet recommendation: Diabetic  Filed Weights   11/04/14 2142 11/05/14 0355  Weight: 119.387 kg (263 lb 3.2 oz) 121.11 kg (267 lb)    History of present illness:  From original history of present illness:  39 year old female with history of HTN and DM who came with cc of fever and pain/drianage in her abscess. This has been going on for a week but she has been pushing through and tolerating it to be able to take her daughter to school  Hospital Course:  Abscess -Status post I&D and packing in the ED. Patient should follow-up with her primary care physician for continued care. Patient verbalizes understanding and agreement. And she reports sure to have follow-up appointment. - We'll continue high-dose Bactrim based on her weight for the next 4 days to complete an 8 day treatment regimen - Provide prescription for Percocet on discharge for pain management  Trichomonas -Treated with Flagyl in the ED  Diabetes mellitus -Patient refuses subcutaneous insulin for treatment of blood sugars. Will discharge on metformin and add glipizide. Discussed that she needed to adhere to a diabetic diet. - Again recommend that she follow-up with her primary care physician for further monitoring and adjustment  Procedures:  As listed above  Consultations:  None  Discharge  Exam: Filed Vitals:   11/07/14 0949  BP: 116/63  Pulse: 73  Temp: 98.4 F (36.9 C)  Resp: 21    General: Patient in no acute distress, alert and awake Cardiovascular: Regular rate and rhythm, no murmurs or rubs Respiratory: Clear to auscultation bilaterally, no wheezes Skin: Patient has no purulent discharge from abscess which was I&D. Packing in place  Discharge Instructions   Discharge Instructions    Call MD for:  redness, tenderness, or signs of infection (pain, swelling, redness, odor or green/yellow discharge around incision site)    Complete by:  As directed      Call MD for:  temperature >100.4    Complete by:  As directed      Diet - low sodium heart healthy    Complete by:  As directed      Discharge instructions    Complete by:  As directed   Please follow up with your primary care physician for further evaluation of your recent abscess.     Increase activity slowly    Complete by:  As directed           Current Discharge Medication List    START taking these medications   Details  glipiZIDE (GLUCOTROL) 5 MG tablet Take 1 tablet (5 mg total) by mouth daily before breakfast. Qty: 30 tablet, Refills: 0    sulfamethoxazole-trimethoprim (BACTRIM DS,SEPTRA DS) 800-160 MG per tablet Take 2 tablets by mouth every 12 (twelve) hours. Qty: 16 tablet, Refills: 0      CONTINUE these medications which have CHANGED   Details  oxyCODONE-acetaminophen (PERCOCET) 10-325 MG per  tablet Take 1 tablet by mouth every 4 (four) hours as needed for pain. Qty: 30 tablet, Refills: 0      CONTINUE these medications which have NOT CHANGED   Details  albuterol (PROVENTIL HFA;VENTOLIN HFA) 108 (90 BASE) MCG/ACT inhaler Inhale 1-2 puffs into the lungs every 6 (six) hours as needed for wheezing or shortness of breath.    metFORMIN (GLUCOPHAGE) 500 MG tablet Take 1,000 mg by mouth 2 (two) times daily with a meal.    rosuvastatin (CRESTOR) 20 MG tablet Take 20 mg by mouth daily.       STOP taking these medications     lisinopril (PRINIVIL,ZESTRIL) 10 MG tablet        No Known Allergies Follow-up Information    Go to [facility]    .   Why:  Appointment November 14, 2014 Tuesday at 2 :30 pm    Contact information:   [address] [place] [address] [phone]       The results of significant diagnostics from this hospitalization (including imaging, microbiology, ancillary and laboratory) are listed below for reference.    Significant Diagnostic Studies: No results found.  Microbiology: Recent Results (from the past 240 hour(s))  Blood Culture (routine x 2)     Status: None (Preliminary result)   Collection Time: 11/04/14 11:55 PM  Result Value Ref Range Status   Specimen Description BLOOD RIGHT ARM  Final   Special Requests BOTTLES DRAWN AEROBIC AND ANAEROBIC 10CC   Final   Culture NO GROWTH 2 DAYS  Final   Report Status PENDING  Incomplete  Blood Culture (routine x 2)     Status: None (Preliminary result)   Collection Time: 11/05/14 12:01 AM  Result Value Ref Range Status   Specimen Description BLOOD LEFT ARM  Final   Special Requests BOTTLES DRAWN AEROBIC AND ANAEROBIC 10CC  Final   Culture NO GROWTH 2 DAYS  Final   Report Status PENDING  Incomplete  Urine culture     Status: None   Collection Time: 11/05/14 12:36 AM  Result Value Ref Range Status   Specimen Description URINE, CLEAN CATCH  Final   Special Requests NONE  Final   Culture 5,000 COLONIES/mL INSIGNIFICANT GROWTH  Final   Report Status 11/06/2014 FINAL  Final  Culture, routine-abscess     Status: None (Preliminary result)   Collection Time: 11/05/14 12:39 AM  Result Value Ref Range Status   Specimen Description ABSCESS RIGHT BUTTOCKS  Final   Special Requests NONE  Final   Gram Stain   Final    NO WBC SEEN NO SQUAMOUS EPITHELIAL CELLS SEEN MODERATE GRAM POSITIVE COCCI IN PAIRS FEW GRAM NEGATIVE RODS Performed at [facility]    Culture   Final    Culture reincubated for better growth Performed at [facility]    Report Status PENDING  Incomplete  Wet prep, genital     Status: Abnormal   Collection Time: 11/05/14  3:12 AM  Result Value Ref Range Status   Yeast Wet Prep HPF POC NONE SEEN NONE SEEN Final   Trich, Wet Prep FEW (A) NONE SEEN Final   Clue Cells Wet Prep HPF POC NONE SEEN NONE SEEN Final   WBC, Wet Prep HPF POC MANY (A) NONE SEEN Final     Labs: Basic Metabolic Panel:  Recent Labs Lab 11/05/14 0045 11/07/14 0005  NA 129* 133*  K 3.3* 3.7  CL 94* 101  CO2 25 25  GLUCOSE 392* 249*  BUN 8 6  CREATININE 0.74 0.46  CALCIUM 9.3 8.1*   Liver Function Tests:  Recent Labs Lab 11/05/14 0045  AST 16  ALT 16  ALKPHOS 121  BILITOT 0.7  PROT 7.1  ALBUMIN 2.8*   No results for input(s): LIPASE, AMYLASE in the last 168 hours. No results for input(s): AMMONIA in the last 168 hours. CBC:  Recent Labs Lab 11/05/14 0045 11/07/14 0005  WBC 16.3* 5.7  NEUTROABS 13.6*  --   HGB 13.0 10.4*  HCT 36.9 30.3*  MCV 90.0 90.7  PLT 350 315   Cardiac Enzymes: No results for input(s): CKTOTAL, CKMB, CKMBINDEX, TROPONINI in the last 168 hours. BNP: BNP (last 3 results) No results for input(s): BNP in the last 8760 hours.  ProBNP (last 3 results) No results for input(s): PROBNP in the last 8760 hours.  CBG:  Recent Labs Lab 11/06/14 1126 11/06/14 1830 11/06/14 2144 11/07/14 0742 11/07/14 1142  GLUCAP 239* 371* 227* 222* 262*     Signed:  Penny Pia  [facility] 11/07/2014, 4:30 PM

## 2014-11-07 NOTE — Care Management (Signed)
Patient has follow up appointment at [facility] November 14, 2014 at 2 30 pm . Patient aware and information on discharge instructions.  Ronny Flurry RN BSN [phone]

## 2014-11-09 LAB — CULTURE, ROUTINE-ABSCESS: Gram Stain: NONE SEEN

## 2014-11-10 LAB — CULTURE, BLOOD (ROUTINE X 2)
CULTURE: NO GROWTH
Culture: NO GROWTH

## 2014-11-14 ENCOUNTER — Other Ambulatory Visit: Payer: Self-pay | Admitting: *Deleted

## 2014-11-14 ENCOUNTER — Encounter: Payer: Self-pay | Admitting: Family Medicine

## 2014-11-14 ENCOUNTER — Ambulatory Visit: Payer: Medicaid Other | Attending: Family Medicine | Admitting: Family Medicine

## 2014-11-14 VITALS — BP 130/86 | HR 82 | Temp 99.1°F | Ht 72.0 in | Wt 254.0 lb

## 2014-11-14 DIAGNOSIS — L0390 Cellulitis, unspecified: Secondary | ICD-10-CM

## 2014-11-14 DIAGNOSIS — E1165 Type 2 diabetes mellitus with hyperglycemia: Secondary | ICD-10-CM

## 2014-11-14 DIAGNOSIS — L0291 Cutaneous abscess, unspecified: Secondary | ICD-10-CM | POA: Diagnosis not present

## 2014-11-14 DIAGNOSIS — R739 Hyperglycemia, unspecified: Secondary | ICD-10-CM

## 2014-11-14 LAB — POCT URINALYSIS DIPSTICK
Bilirubin, UA: NEGATIVE
GLUCOSE UA: 500
Ketones, UA: NEGATIVE
LEUKOCYTES UA: NEGATIVE
Nitrite, UA: NEGATIVE
Protein, UA: NEGATIVE
SPEC GRAV UA: 1.015
UROBILINOGEN UA: 0.2
pH, UA: 6.5

## 2014-11-14 LAB — GLUCOSE, POCT (MANUAL RESULT ENTRY): POC Glucose: 350 mg/dl — AB (ref 70–99)

## 2014-11-14 MED ORDER — GLUCOSE BLOOD VI STRP: 1.0000 | ORAL_STRIP | Freq: Three times a day (TID) | Status: DC

## 2014-11-14 MED ORDER — ACETAMINOPHEN-CODEINE #3 300-30 MG PO TABS: 1.0000 | ORAL_TABLET | Freq: Three times a day (TID) | ORAL | Status: DC | PRN

## 2014-11-14 MED ORDER — GLIPIZIDE 10 MG PO TABS: 10.0000 mg | ORAL_TABLET | Freq: Two times a day (BID) | ORAL | Status: DC

## 2014-11-14 MED ORDER — LISINOPRIL 5 MG PO TABS: 5.0000 mg | ORAL_TABLET | Freq: Every day | ORAL | Status: DC

## 2014-11-14 MED ORDER — ACCU-CHEK AVIVA DEVI: Status: DC

## 2014-11-14 MED ORDER — ACCU-CHEK SOFTCLIX LANCET DEV MISC: 1.0000 | Freq: Three times a day (TID) | Status: DC

## 2014-11-14 NOTE — Progress Notes (Signed)
CC: follow up from Hospitalization  HPI: Debra [name] is a 39 y.o. female here today for a follow up visit.  Patient has past medical history of uncontrolled type 2 diabetes mellitus (A1c 11.2 from 11/2014)  and has been out of her medications for the last 3 months ever since she relocated to [place] from [place].  She was recently hospitalized at [facility] from 11/04/14 to 11/07/14 after she had presented with fever and drainage from a buttock abscess. She had an incision and drainage of the abscess, culture was positive for staph aureus and she was placed on Vancomycin and Zosyn and subsequently transitioned to Bactrim for 8 days as well as Percocet for pain. During the course of the hospitalization she was seen by the diabetes coordinator as well as a Museum/gallery exhibitions officer; she was also treated for Trichomonas.  Today she reports she has run out of her Percocet and is in pain. Continues to comply with her metformin and glipizide for her diabetes and still refuses to go on insulin for better control. CBG is 350 in the clinic. Patient has No headache, No chest pain, No abdominal pain - No Nausea, No new weakness tingling or numbness, No Cough - SOB.  Allergies  Allergen Reactions  . Ibuprofen     Upset stomach   Past Medical History  Diagnosis Date  . Diabetes mellitus without complication   . Back pain   . Abscess   . Hyperlipidemia    Current Outpatient Prescriptions on File Prior to Visit  Medication Sig Dispense Refill  . albuterol (PROVENTIL HFA;VENTOLIN HFA) 108 (90 BASE) MCG/ACT inhaler Inhale 1-2 puffs into the lungs every 6 (six) hours as needed for wheezing or shortness of breath.    Marland Kitchen glipiZIDE (GLUCOTROL) 5 MG tablet Take 1 tablet (5 mg total) by mouth daily before breakfast. 30 tablet 0  . metFORMIN (GLUCOPHAGE) 500 MG tablet Take 1,000 mg by mouth 2 (two) times daily with a meal.    . sulfamethoxazole-trimethoprim (BACTRIM DS,SEPTRA DS) 800-160 MG per  tablet Take 2 tablets by mouth every 12 (twelve) hours. 16 tablet 0  . oxyCODONE-acetaminophen (PERCOCET) 10-325 MG per tablet Take 1 tablet by mouth every 4 (four) hours as needed for pain. (Patient not taking: Reported on 11/14/2014) 30 tablet 0  . rosuvastatin (CRESTOR) 20 MG tablet Take 20 mg by mouth daily.     No current facility-administered medications on file prior to visit.   Family History  Problem Relation Age of Onset  . Diabetes Mother   . Hypertension Mother   . Hyperlipidemia Mother   . Diabetes Father   . Heart disease Father    Social History   Social History  . Marital Status: Single    Spouse Name: N/A  . Number of Children: N/A  . Years of Education: N/A   Occupational History  . Not on file.   Social History Main Topics  . Smoking status: Current Every Day Smoker -- 0.25 packs/day for 16 years    Types: Cigarettes  . Smokeless tobacco: Never Used  . Alcohol Use: No  . Drug Use: No  . Sexual Activity: Not Currently    Birth Control/ Protection: None   Other Topics Concern  . Not on file   Social History Narrative    Review of Systems: Constitutional: Negative for fever, chills, diaphoresis, activity change, appetite change and fatigue. HENT: Negative for ear pain, nosebleeds, congestion, facial swelling, rhinorrhea, neck pain, neck stiffness and ear discharge.  Eyes: Negative for pain, discharge, redness, itching and visual disturbance. Respiratory: Negative for cough, choking, chest tightness, shortness of breath, wheezing and stridor.  Cardiovascular: Negative for chest pain, palpitations and leg swelling. Gastrointestinal: Negative for abdominal distention. Genitourinary: Negative for dysuria, urgency, frequency, hematuria, flank pain, decreased urine volume, difficulty urinating and dyspareunia.  Musculoskeletal: Negative for back pain, joint swelling, arthralgias and gait problem. Neurological: Negative for dizziness, tremors, seizures,  syncope, facial asymmetry, speech difficulty, weakness, light-headedness, numbness and headaches.  Hematological: Negative for adenopathy. Does not bruise/bleed easily. Skin: see hpi Psychiatric/Behavioral: Negative for hallucinations, behavioral problems, confusion, dysphoric mood, decreased concentration and agitation.    Objective: Filed Vitals:   11/14/14 1429  BP: 130/86  Pulse: 82  Temp: 99.1 F (37.3 C)  Height: 6' (1.829 m)  Weight: 254 lb (115.214 kg)  SpO2: 98%       Physical Exam: Constitutional: Patient appears well-developed and well-nourished. No distress. Neck: Normal ROM. Neck supple. No JVD. No tracheal deviation. No thyromegaly. CVS: RRR, S1/S2 +, no murmurs, no gallops, no carotid bruit.  Pulmonary: Effort and breath sounds normal, no stridor, rhonchi, wheezes, rales.  Abdominal: Soft. BS +,  no distension, tenderness, rebound or guarding.  Musculoskeletal: Normal range of motion. No edema and no tenderness.  Lymphadenopathy: No lymphadenopathy noted, cervical, inguinal or axillary Genitourinary: Abscess on the inferior aspect of right buttock with empty carvity and no discharge noted.  Neuro: Alert. Normal reflexes, muscle tone coordination. No cranial nerve deficit. Skin: Skin is warm and dry. No rash noted. Not diaphoretic. No erythema. No pallor. Psychiatric: Normal mood and affect. Behavior, judgment, thought content normal.  Lab Results  Component Value Date   WBC 5.7 11/07/2014   HGB 10.4* 11/07/2014   HCT 30.3* 11/07/2014   MCV 90.7 11/07/2014   PLT 315 11/07/2014   Lab Results  Component Value Date   CREATININE 0.46 11/07/2014   BUN 6 11/07/2014   NA 133* 11/07/2014   K 3.7 11/07/2014   CL 101 11/07/2014   CO2 25 11/07/2014    Lab Results  Component Value Date   HGBA1C 11.2* 11/05/2014        Assessment and plan:   Type 2 Dm: Uncontrolled with A1c of 11.2, CBG is 350. Increased Glipizide to 10mg  bid. Placed on ACEI for  reno-protection Refuses initiation of insulin. Prescription written for testing supplies and I will review her blood sugar log with her next office visit. Advised on ADA diet, weight loss and lifestyle changes.  Abscess of right buttock: Packed with iodoform and gauze applied over it Will reassess at next visit  This note has been created with Education officer, environmental. Any transcriptional errors are unintentional.     Jaclyn Shaggy, MD. [facility] [phone] 11/14/2014, 2:44 PM

## 2014-11-14 NOTE — Progress Notes (Signed)
Patient is here for follow up after I&D for abscess on right buttock Patient states she is using sanitary napkins, changing it 4x a day  She states there is copious amounts of drainage on the pad, brownish yellow in color and has noticed an odor Pain today 8/10, last oxycodone taken this morning and is asking for something for pain Patient reports taking all medications as prescribed  CBG 350 today Patient took metformin this morning and ate last meal around 1030AM

## 2014-11-14 NOTE — Patient Instructions (Signed)
Diabetes Mellitus and Food It is important for you to manage your blood sugar (glucose) level. Your blood glucose level can be greatly affected by what you eat. Eating healthier foods in the appropriate amounts throughout the day at about the same time each day will help you control your blood glucose level. It can also help slow or prevent worsening of your diabetes mellitus. Healthy eating may even help you improve the level of your blood pressure and reach or maintain a healthy weight.  HOW CAN FOOD AFFECT ME? Carbohydrates Carbohydrates affect your blood glucose level more than any other type of food. Your dietitian will help you determine how many carbohydrates to eat at each meal and teach you how to count carbohydrates. Counting carbohydrates is important to keep your blood glucose at a healthy level, especially if you are using insulin or taking certain medicines for diabetes mellitus. Alcohol Alcohol can cause sudden decreases in blood glucose (hypoglycemia), especially if you use insulin or take certain medicines for diabetes mellitus. Hypoglycemia can be a life-threatening condition. Symptoms of hypoglycemia (sleepiness, dizziness, and disorientation) are similar to symptoms of having too much alcohol.  If your health care provider has given you approval to drink alcohol, do so in moderation and use the following guidelines:  Women should not have more than one drink per day, and men should not have more than two drinks per day. One drink is equal to:  12 oz of beer.  5 oz of wine.  1 oz of hard liquor.  Do not drink on an empty stomach.  Keep yourself hydrated. Have water, diet soda, or unsweetened iced tea.  Regular soda, juice, and other mixers might contain a lot of carbohydrates and should be counted. WHAT FOODS ARE NOT RECOMMENDED? As you make food choices, it is important to remember that all foods are not the same. Some foods have fewer nutrients per serving than other  foods, even though they might have the same number of calories or carbohydrates. It is difficult to get your body what it needs when you eat foods with fewer nutrients. Examples of foods that you should avoid that are high in calories and carbohydrates but low in nutrients include:  Trans fats (most processed foods list trans fats on the Nutrition Facts label).  Regular soda.  Juice.  Candy.  Sweets, such as cake, pie, doughnuts, and cookies.  Fried foods. WHAT FOODS CAN I EAT? Have nutrient-rich foods, which will nourish your body and keep you healthy. The food you should eat also will depend on several factors, including:  The calories you need.  The medicines you take.  Your weight.  Your blood glucose level.  Your blood pressure level.  Your cholesterol level. You also should eat a variety of foods, including:  Protein, such as meat, poultry, fish, tofu, nuts, and seeds (lean animal proteins are best).  Fruits.  Vegetables.  Dairy products, such as milk, cheese, and yogurt (low fat is best).  Breads, grains, pasta, cereal, rice, and beans.  Fats such as olive oil, trans fat-free margarine, canola oil, avocado, and olives. DOES EVERYONE WITH DIABETES MELLITUS HAVE THE SAME MEAL PLAN? Because every person with diabetes mellitus is different, there is not one meal plan that works for everyone. It is very important that you meet with a dietitian who will help you create a meal plan that is just right for you. Document Released: 11/14/2004 Document Revised: 02/22/2013 Document Reviewed: 01/14/2013 Exit[employer] Patient Information 2015 [employer]. This   information is not intended to replace advice given to you by your health care provider. Make sure you discuss any questions you have with your health care provider.  

## 2014-11-23 ENCOUNTER — Ambulatory Visit: Payer: Medicaid Other | Admitting: Family Medicine

## 2014-11-23 ENCOUNTER — Encounter: Payer: Medicaid Other | Admitting: Pharmacist

## 2015-05-24 ENCOUNTER — Encounter (HOSPITAL_COMMUNITY): Payer: Medicaid Other

## 2015-05-24 ENCOUNTER — Encounter (HOSPITAL_COMMUNITY): Payer: Self-pay | Admitting: Emergency Medicine

## 2015-05-24 ENCOUNTER — Emergency Department (HOSPITAL_COMMUNITY)
Admission: EM | Admit: 2015-05-24 | Discharge: 2015-05-24 | Disposition: A | Discharge status: Home / Self Care | Payer: Medicaid Other | Attending: Emergency Medicine | Admitting: Emergency Medicine

## 2015-05-24 ENCOUNTER — Other Ambulatory Visit: Payer: Self-pay

## 2015-05-24 DIAGNOSIS — M79671 Pain in right foot: Secondary | ICD-10-CM | POA: Diagnosis not present

## 2015-05-24 DIAGNOSIS — F17210 Nicotine dependence, cigarettes, uncomplicated: Secondary | ICD-10-CM | POA: Diagnosis not present

## 2015-05-24 DIAGNOSIS — E785 Hyperlipidemia, unspecified: Secondary | ICD-10-CM | POA: Insufficient documentation

## 2015-05-24 DIAGNOSIS — Z792 Long term (current) use of antibiotics: Secondary | ICD-10-CM | POA: Insufficient documentation

## 2015-05-24 DIAGNOSIS — Z79899 Other long term (current) drug therapy: Secondary | ICD-10-CM | POA: Insufficient documentation

## 2015-05-24 DIAGNOSIS — Z872 Personal history of diseases of the skin and subcutaneous tissue: Secondary | ICD-10-CM | POA: Insufficient documentation

## 2015-05-24 DIAGNOSIS — Z7984 Long term (current) use of oral hypoglycemic drugs: Secondary | ICD-10-CM | POA: Diagnosis not present

## 2015-05-24 DIAGNOSIS — R202 Paresthesia of skin: Secondary | ICD-10-CM | POA: Diagnosis not present

## 2015-05-24 DIAGNOSIS — E119 Type 2 diabetes mellitus without complications: Secondary | ICD-10-CM | POA: Diagnosis not present

## 2015-05-24 DIAGNOSIS — R631 Polydipsia: Secondary | ICD-10-CM | POA: Insufficient documentation

## 2015-05-24 DIAGNOSIS — R358 Other polyuria: Secondary | ICD-10-CM | POA: Insufficient documentation

## 2015-05-24 LAB — I-STAT CHEM 8, ED
BUN: 8 mg/dL (ref 6–20)
CALCIUM ION: 1.16 mmol/L (ref 1.12–1.23)
CHLORIDE: 99 mmol/L — AB (ref 101–111)
CREATININE: 0.6 mg/dL (ref 0.44–1.00)
GLUCOSE: 203 mg/dL — AB (ref 65–99)
HCT: 41 % (ref 36.0–46.0)
Hemoglobin: 13.9 g/dL (ref 12.0–15.0)
POTASSIUM: 3.8 mmol/L (ref 3.5–5.1)
Sodium: 137 mmol/L (ref 135–145)
TCO2: 25 mmol/L (ref 0–100)

## 2015-05-24 LAB — CBC WITH DIFFERENTIAL/PLATELET
Basophils Absolute: 0 10*3/uL (ref 0.0–0.1)
Basophils Relative: 0 %
EOS ABS: 0.1 10*3/uL (ref 0.0–0.7)
EOS PCT: 1 %
HCT: 39.8 % (ref 36.0–46.0)
Hemoglobin: 13.6 g/dL (ref 12.0–15.0)
LYMPHS ABS: 3.4 10*3/uL (ref 0.7–4.0)
LYMPHS PCT: 43 %
MCH: 30.8 pg (ref 26.0–34.0)
MCHC: 34.2 g/dL (ref 30.0–36.0)
MCV: 90 fL (ref 78.0–100.0)
MONOS PCT: 6 %
Monocytes Absolute: 0.5 10*3/uL (ref 0.1–1.0)
Neutro Abs: 4 10*3/uL (ref 1.7–7.7)
Neutrophils Relative %: 50 %
Platelets: 377 10*3/uL (ref 150–400)
RBC: 4.42 MIL/uL (ref 3.87–5.11)
RDW: 12.5 % (ref 11.5–15.5)
WBC: 8 10*3/uL (ref 4.0–10.5)

## 2015-05-24 LAB — CBG MONITORING, ED: GLUCOSE-CAPILLARY: 314 mg/dL — AB (ref 65–99)

## 2015-05-24 MED ORDER — HYDROCODONE-ACETAMINOPHEN 5-325 MG PO TABS
2.0000 | ORAL_TABLET | Freq: Once | ORAL | Status: AC
Start: 2015-05-24 — End: 2015-05-24
  Administered 2015-05-24: 2 via ORAL
  Filled 2015-05-24: qty 2

## 2015-05-24 MED ORDER — SODIUM CHLORIDE 0.9 % IV BOLUS (SEPSIS)
1000.0000 mL | Freq: Once | INTRAVENOUS | Status: AC
  Administered 2015-05-24: 1000 mL via INTRAVENOUS

## 2015-05-24 NOTE — Consult Note (Signed)
[facility]  Patient name: Debra AgarStephanie [name] MRN: [id number] DOB: 02/12/1976 Sex: female  REASON FOR CONSULT: Both feet cold and right foot pain. Consult is from the emergency department.  HPI: Debra [name] is a 40 y.o. female, who has been [place] where it was snowy and cold. She noted that both feet were cold for the last week. Of note, she is somewhat of a poor historian. On further questioning she does describe some pain in her right foot but no symptoms on the left side. I do not get any classic history of claudication although she states that her entire right lower extremity hurt sometimes when she walks but also hurts when she lays on her side. She also describes some pain in her foot at night although this is relieved at times by lying on her side but also at times by hanging her foot down. This could possibly be consistent with rest pain. She denies any history of nonhealing ulcers.  Her risk factors for peripheral vascular disease include diabetes, hypertension, family history of premature cardiovascular disease, and a history of tobacco use. She denies any history of hypercholesterolemia.  Past Medical History  Diagnosis Date  . Diabetes mellitus without complication (HCC)   . Back pain   . Abscess   . Hyperlipidemia     Family History  Problem Relation Age of Onset  . Diabetes Mother   . Hypertension Mother   . Hyperlipidemia Mother   . Diabetes Father   . Heart disease Father   Her father had coronary disease in his 7850s.   SOCIAL HISTORY: She is single. She has 2 children. She smokes a third of a pack per day of cigarettes and has been smoking for 20 years. Social History   Social History  . Marital Status: Single    Spouse Name: N/A  . Number of Children: N/A  . Years of Education: N/A   Occupational History  . Not on file.   Social History Main Topics  . Smoking status: Current Every Day Smoker -- 0.25 packs/day for  16 years    Types: Cigarettes  . Smokeless tobacco: Never Used  . Alcohol Use: No  . Drug Use: No  . Sexual Activity: Not Currently    Birth Control/ Protection: None   Other Topics Concern  . Not on file   Social History Narrative    Allergies  Allergen Reactions  . Ibuprofen     Upset stomach    No current facility-administered medications for this encounter.   Current Outpatient Prescriptions  Medication Sig Dispense Refill  . aspirin 325 MG tablet Take 325 mg by mouth daily.    Marland Kitchen. atorvastatin (LIPITOR) 20 MG tablet Take 20 mg by mouth daily.    . Blood Glucose Monitoring Suppl (ACCU-CHEK AVIVA) device Use as instructed 1 each 0  . glimepiride (AMARYL) 2 MG tablet Take 2 mg by mouth daily with breakfast.    . glipiZIDE (GLUCOTROL) 10 MG tablet Take 1 tablet (10 mg total) by mouth 2 (two) times daily before a meal. (Patient taking differently: Take 5 mg by mouth 2 (two) times daily before a meal. ) 60 tablet 3  . glucose blood (ACCU-CHEK AVIVA) test strip 1 each by Other route 3 (three) times daily. 100 each 5  . Lancet Devices (ACCU-CHEK SOFTCLIX) lancets 1 each by Other route 3 (three) times daily. 1 each 0  . lisinopril (PRINIVIL,ZESTRIL) 5 MG tablet Take 1 tablet (5 mg  total) by mouth daily. (Patient taking differently: Take 10 mg by mouth daily. ) 30 tablet 3  . metFORMIN (GLUCOPHAGE) 500 MG tablet Take 1,000 mg by mouth 2 (two) times daily with a meal.    . acetaminophen-codeine (TYLENOL #3) 300-30 MG per tablet Take 1 tablet by mouth every 8 (eight) hours as needed for moderate pain. (Patient not taking: Reported on 05/24/2015) 30 tablet 0  . oxyCODONE-acetaminophen (PERCOCET) 10-325 MG per tablet Take 1 tablet by mouth every 4 (four) hours as needed for pain. (Patient not taking: Reported on 11/14/2014) 30 tablet 0  . sulfamethoxazole-trimethoprim (BACTRIM DS,SEPTRA DS) 800-160 MG per tablet Take 2 tablets by mouth every 12 (twelve) hours. (Patient not taking: Reported on  05/24/2015) 16 tablet 0    REVIEW OF SYSTEMS:   denotes positive finding,  denotes negative finding Cardiac  Comments:  Chest pain or chest pressure:    Shortness of breath upon exertion:    Short of breath when lying flat:    Irregular heart rhythm:        Vascular    Pain in calf, thigh, or hip brought on by ambulation: X The symptoms she describes are not classic for claudication.  Pain in feet at night that wakes you up from your sleep:     Blood clot in your veins:    Leg swelling:         Pulmonary    Oxygen at home:    Productive cough:     Wheezing:         Neurologic    Sudden weakness in arms or legs:     Sudden numbness in arms or legs:     Sudden onset of difficulty speaking or slurred speech:    Temporary loss of vision in one eye:     Problems with dizziness:  X       Gastrointestinal    Blood in stool:     Vomited blood:         Genitourinary    Burning when urinating:     Blood in urine:        Psychiatric    Major depression:         Hematologic    Bleeding problems:    Problems with blood clotting too easily:        Skin    Rashes or ulcers:        Constitutional    Fever or chills:      PHYSICAL EXAM: Filed Vitals:   05/24/15 1215 05/24/15 1230 05/24/15 1245 05/24/15 1300  BP: 109/73 119/72 102/39 110/59  Pulse: 73 72 70 71  Temp:      Resp:      Height:      Weight:      SpO2: 99% 100% 99% 99%    GENERAL: The patient is a well-nourished female, in no acute distress. The vital signs are documented above. CARDIAC: There is a regular rate and rhythm.  VASCULAR: I do not detect carotid bruits. On the right side, she has a palpable femoral pulse which is normal. I cannot palpate a popliteal pulse or pedal pulses. She does have a monophasic right peroneal and anterior tibial signal with Doppler. I cannot obtain a posterior tibial or dorsalis pedis signal on the right. On the left side, she has a normal femoral pulse. I cannot  palpate a popliteal pulse although she does have a palpable dorsalis pedis pulse. She has biphasic signals in  the left foot and the dorsalis pedis and posterior tibial positions. Both feet appear adequately perfused. PULMONARY: There is good air exchange bilaterally without wheezing or rales. ABDOMEN: Soft and non-tender with normal pitched bowel sounds. I cannot palpate an abdominal aortic aneurysm although her abdomen is difficult to assess because of her obesity. MUSCULOSKELETAL: There are no major deformities or cyanosis. NEUROLOGIC: No focal weakness or paresthesias are detected. SKIN: There are no ulcers or rashes noted. PSYCHIATRIC: The patient has a normal affect.  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 She has diabetes and continues to smoke. Given that she is having some pain in the right foot I have recommended that we schedule an arteriogram early next week to further assess this. I have reviewed with the patient the indications for arteriography. In addition, I have reviewed the potential complications of arteriography including but not limited to: Bleeding, arterial injury, arterial thrombosis, dye action, renal insufficiency, or other unpredictable medical problems. I have explained to the patient that if we find disease amenable to angioplasty we could potentially address this at the same time. I have discussed the potential complications of angioplasty and stenting, including but not limited to: Bleeding, arterial thrombosis, arterial injury, dissection, or the need for surgical intervention. I do not think there is any evidence of an acute arterial occlusion. I have discussed with her the importance of tobacco cessation. She will also begin an aspirin daily.  My office will call to arrange for an arteriogram Monday of next  week. I'll make further recommendations pending these results.  Waverly Ferrari [facility] [place]: [phone]

## 2015-05-24 NOTE — Discharge Instructions (Signed)
Today we saw you for your right foot pain. After evaluation, we believe you may have decreased blood flow to your foot due to your Diabetes and high cholesterol. Please make an appointment with Dr. Cari Carawayhris Dickson who is a vascular doctor and can look at the arteries in your legs.  Please start taking your Aspirin once a day, every day.  Additionally, your blood sugar was in the 300s today. Please talk to your family doctor about adjusting your medicines so your blood sugar is better controlled. Uncontrolled blood sugar will lead to more problems with your legs and can affect your heart as well. Please also talk to your family doctor for recommendations on proper diet for Diabetics.

## 2015-05-24 NOTE — ED Provider Notes (Signed)
The patient is a 40 year old female, she is a known diabetic for the last 5 years, she has had several years of treatment, she takes her medications regularly but endorses not eating a strict diabetic diet. She reports that over the last week she has had increasing pain in her right foot, this seems to be gradually worsening and is associated with a cold feeling of the foot and tingling. Strangely the patient reports that this is better when she ambulates. She denies chest pain shortness of breath or any other symptoms.  On exam the patient does have increased pain with palpation over the dorsum of the foot, pain with manipulating the right great toe, her capillary refill time is slowed however she does have approximately 3 seconds of capillary refill. The right foot is cool to touch compared to the left foot.  The left foot has normal pulses at the dorsalis pedis and posterior tibial arteries. I am unable to obtain pulses by Doppler of the right foot. At either location. Heart and lung exams are normal, the patient does not appear in distress. We'll obtain labs,   Labs reassuring other than CBG - I d/w Dr. Edilia Boickson who will see pt in consultation  Dr. Edilia Boickson has seen pt and has arranged the arteriogram and f/u in office, pt appears stable.  Medical screening examination/treatment/procedure(s) were conducted as a shared visit with non-physician practitioner(s) and myself.  I personally evaluated the patient during the encounter.  Clinical Impression:   Final diagnoses:  Right foot pain         Eber HongBrian Shanequa Whitenight, MD 05/26/15 [phone]

## 2015-05-24 NOTE — ED Provider Notes (Signed)
CSN: [id number]     Arrival date & time 05/24/15  1122 History   First MD Initiated Contact with Patient 05/24/15 1127     Chief Complaint  Patient presents with  . Foot Pain    Right   40 year old female presents with atraumatic right foot pain. PMHx significant for uncontrolled DM (BG normally in the 300 per pt), HTN, HLD. She states she was out of town last week when she suddenly began to experience the feeling of her both feet being cold. She states she soaked them in warm water which warmed them temporarily but the cold feeling came back. 1 week ago her right foot started to become painful. She reports the feeling of a sore tightness with tingling that extends from the anterior ankle and radiates to the great toe. She reports associated decreased ROM. Denies fever, chest pain, SOB, calf pain, difficulty ambulating, left foot pain.  The history is provided by the patient.    Past Medical History  Diagnosis Date  . Diabetes mellitus without complication (HCC)   . Back pain   . Abscess   . Hyperlipidemia    History reviewed. No pertinent past surgical history. Family History  Problem Relation Age of Onset  . Diabetes Mother   . Hypertension Mother   . Hyperlipidemia Mother   . Diabetes Father   . Heart disease Father    Social History  Substance Use Topics  . Smoking status: Current Every Day Smoker -- 0.25 packs/day for 16 years    Types: Cigarettes  . Smokeless tobacco: Never Used  . Alcohol Use: No   OB History    No data available     Review of Systems  Constitutional: Negative for fever.  Respiratory: Negative for shortness of breath.   Cardiovascular: Negative for chest pain.  Gastrointestinal: Negative for abdominal pain.  Endocrine: Positive for polydipsia and polyuria.  Genitourinary: Negative for dysuria.  Musculoskeletal: Positive for arthralgias. Negative for joint swelling and gait problem.  Skin: Negative for rash.  Neurological: Negative for  light-headedness.       Paresthesias      Allergies  Ibuprofen  Home Medications   Prior to Admission medications   Medication Sig Start Date End Date Taking? Authorizing Provider  acetaminophen-codeine (TYLENOL #3) 300-30 MG per tablet Take 1 tablet by mouth every 8 (eight) hours as needed for moderate pain. 11/14/14   Jaclyn Shaggy, MD  albuterol (PROVENTIL HFA;VENTOLIN HFA) 108 (90 BASE) MCG/ACT inhaler Inhale 1-2 puffs into the lungs every 6 (six) hours as needed for wheezing or shortness of breath.    Historical Provider, MD  atorvastatin (LIPITOR) 20 MG tablet Take 20 mg by mouth daily.    Historical Provider, MD  Blood Glucose Monitoring Suppl (ACCU-CHEK AVIVA) device Use as instructed 11/14/14 11/14/15  Jaclyn Shaggy, MD  glipiZIDE (GLUCOTROL) 10 MG tablet Take 1 tablet (10 mg total) by mouth 2 (two) times daily before a meal. 11/14/14   Jaclyn Shaggy, MD  glucose blood (ACCU-CHEK AVIVA) test strip 1 each by Other route 3 (three) times daily. 11/14/14   Jaclyn Shaggy, MD  Lancet Devices [facility]) lancets 1 each by Other route 3 (three) times daily. 11/14/14   Jaclyn Shaggy, MD  lisinopril (PRINIVIL,ZESTRIL) 5 MG tablet Take 1 tablet (5 mg total) by mouth daily. 11/14/14   Jaclyn Shaggy, MD  metFORMIN (GLUCOPHAGE) 500 MG tablet Take 1,000 mg by mouth 2 (two) times daily with a meal.    Historical Provider,  MD  oxyCODONE-acetaminophen (PERCOCET) 10-325 MG per tablet Take 1 tablet by mouth every 4 (four) hours as needed for pain. Patient not taking: Reported on 11/14/2014 11/07/14   Penny Piarlando Vega, MD  rosuvastatin (CRESTOR) 20 MG tablet Take 20 mg by mouth daily.    Historical Provider, MD  sulfamethoxazole-trimethoprim (BACTRIM DS,SEPTRA DS) 800-160 MG per tablet Take 2 tablets by mouth every 12 (twelve) hours. 11/07/14   Penny Piarlando Vega, MD   BP 129/65 mmHg  Pulse 82  Temp(Src) 97.7 F (36.5 C)  Resp 17  Ht 5\' 10"  (1.778 m)  Wt 120.203 kg  BMI 38.02 kg/m2  SpO2 100%  LMP  05/03/2015 Physical Exam  Constitutional: She is oriented to person, place, and time. She appears well-developed and well-nourished. No distress.  HENT:  Head: Normocephalic and atraumatic.  Eyes: Pupils are equal, round, and reactive to light.  Cardiovascular: Normal rate and regular rhythm.  Exam reveals no gallop and no friction rub.   No murmur heard. Pulses:      Dorsalis pedis pulses are 0 on the right side, and 2+ on the left side.       Posterior tibial pulses are 0 on the right side, and 2+ on the left side.  Unable to palpate DP, PT pulses on the right side. Doppler revealed pulses on the left side but unable to locate pulses on the right side  Pulmonary/Chest: Effort normal and breath sounds normal. No respiratory distress. She has no wheezes. She has no rales.  Musculoskeletal:  Right ankle and foot: No rashes, open wounds, erythema. Ankle, dorsum of foot, and 1st metatarsal is tender to palpation. Medial, lateral and 2-5th metatarsals are non-tender. Cap refill is 3 sec. FROM. Pain with dorsiflexion and inversion of ankle. FROM of all toes. Toes feel cool to touch when compared to L foot.   Neurological: She is alert and oriented to person, place, and time.  Skin: Skin is warm and dry.  Psychiatric: She has a normal mood and affect.    ED Course  Procedures (including critical care time) Labs Review Labs Reviewed  CBG MONITORING, ED - Abnormal; Notable for the following:    Glucose-Capillary 314 (*)    All other components within normal limits  I-STAT CHEM 8, ED - Abnormal; Notable for the following:    Chloride 99 (*)    Glucose, Bld 203 (*)    All other components within normal limits  CBC WITH DIFFERENTIAL/PLATELET     MDM   Final diagnoses:  Right foot pain   Pt is a 40 year old female with insidious onset of right foot pain and poikilothermia. Although she has undetectable distal pulses in the right foot and pain, there are no signs of acute arterial  occlusion. There is delayed perfusion with intact ROM and no signs of an ischemic extremity. Due to her risk factors for PAD, Vascular was consulted who recommended outpatient f/u on Monday. Also recommended ASA 325mg  daily, pt informed.  Also of note was her POC BG of 350+. 1L NS given. Pt admittedly does not follow an ADA diet. Advised to f/u with PCP for possible med adjustment. Unlikely her foot pain is due to neuropathy due to it being unilateral and having signs of decreased blood flow to her right foot.  Patient informed of clinical course, understand medical decision-making process, and agrees with plan.  Bethel BornKelly Marie Anaeli Cornwall, PA-C 05/24/15 1435  Eber HongBrian Miller, MD 05/26/15 [phone]

## 2015-05-24 NOTE — ED Notes (Signed)
EMS - Patient coming from home with c/o of ongoing x 1 week with worsening right foot pain.  Patient is tender to the touch on the right foot with swelling and mild bruising.  Patient describes the pain as "razors and bumble bees playing with her toes".  Pain is 10/10.  Patient took Aspirin for the pain with no relief.

## 2015-05-24 NOTE — ED Notes (Signed)
Patient alert and oriented at discharge.  Patient verbalized understanding of discharge instructions.   

## 2015-05-28 ENCOUNTER — Other Ambulatory Visit: Payer: Self-pay | Admitting: *Deleted

## 2015-05-28 ENCOUNTER — Ambulatory Visit (HOSPITAL_COMMUNITY)
Admission: RE | Admit: 2015-05-28 | Discharge: 2015-05-28 | Disposition: A | Discharge status: Home / Self Care | Payer: Medicaid Other | Source: Ambulatory Visit | Attending: Vascular Surgery | Admitting: Vascular Surgery

## 2015-05-28 ENCOUNTER — Encounter (HOSPITAL_COMMUNITY)
Admission: RE | Disposition: A | Discharge status: Home / Self Care | Payer: Self-pay | Source: Ambulatory Visit | Attending: Vascular Surgery

## 2015-05-28 ENCOUNTER — Encounter (HOSPITAL_COMMUNITY): Payer: Self-pay | Admitting: Vascular Surgery

## 2015-05-28 DIAGNOSIS — I70201 Unspecified atherosclerosis of native arteries of extremities, right leg: Secondary | ICD-10-CM | POA: Diagnosis present

## 2015-05-28 DIAGNOSIS — E1151 Type 2 diabetes mellitus with diabetic peripheral angiopathy without gangrene: Secondary | ICD-10-CM | POA: Insufficient documentation

## 2015-05-28 DIAGNOSIS — Z7984 Long term (current) use of oral hypoglycemic drugs: Secondary | ICD-10-CM | POA: Insufficient documentation

## 2015-05-28 DIAGNOSIS — M79671 Pain in right foot: Secondary | ICD-10-CM | POA: Insufficient documentation

## 2015-05-28 DIAGNOSIS — Z9862 Peripheral vascular angioplasty status: Secondary | ICD-10-CM

## 2015-05-28 DIAGNOSIS — I10 Essential (primary) hypertension: Secondary | ICD-10-CM | POA: Insufficient documentation

## 2015-05-28 DIAGNOSIS — F17210 Nicotine dependence, cigarettes, uncomplicated: Secondary | ICD-10-CM | POA: Diagnosis not present

## 2015-05-28 DIAGNOSIS — E785 Hyperlipidemia, unspecified: Secondary | ICD-10-CM | POA: Diagnosis not present

## 2015-05-28 DIAGNOSIS — Z7982 Long term (current) use of aspirin: Secondary | ICD-10-CM | POA: Insufficient documentation

## 2015-05-28 DIAGNOSIS — I739 Peripheral vascular disease, unspecified: Secondary | ICD-10-CM

## 2015-05-28 DIAGNOSIS — I70209 Unspecified atherosclerosis of native arteries of extremities, unspecified extremity: Secondary | ICD-10-CM

## 2015-05-28 DIAGNOSIS — Z8249 Family history of ischemic heart disease and other diseases of the circulatory system: Secondary | ICD-10-CM | POA: Insufficient documentation

## 2015-05-28 HISTORY — PX: PERIPHERAL VASCULAR CATHETERIZATION: SHX172C

## 2015-05-28 LAB — POCT I-STAT, CHEM 8
BUN: 9 mg/dL (ref 6–20)
Calcium, Ion: 1.15 mmol/L (ref 1.12–1.23)
Chloride: 101 mmol/L (ref 101–111)
Creatinine, Ser: 0.5 mg/dL (ref 0.44–1.00)
Glucose, Bld: 263 mg/dL — ABNORMAL HIGH (ref 65–99)
HEMATOCRIT: 44 % (ref 36.0–46.0)
HEMOGLOBIN: 15 g/dL (ref 12.0–15.0)
POTASSIUM: 4 mmol/L (ref 3.5–5.1)
SODIUM: 137 mmol/L (ref 135–145)
TCO2: 25 mmol/L (ref 0–100)

## 2015-05-28 LAB — GLUCOSE, CAPILLARY: GLUCOSE-CAPILLARY: 197 mg/dL — AB (ref 65–99)

## 2015-05-28 LAB — PREGNANCY, URINE: Preg Test, Ur: NEGATIVE

## 2015-05-28 SURGERY — ABDOMINAL AORTOGRAM
Anesthesia: LOCAL

## 2015-05-28 MED ORDER — FENTANYL CITRATE (PF) 100 MCG/2ML IJ SOLN
INTRAMUSCULAR | Status: AC
  Filled 2015-05-28: qty 2

## 2015-05-28 MED ORDER — LIDOCAINE HCL (PF) 1 % IJ SOLN
INTRAMUSCULAR | Status: DC | PRN
  Administered 2015-05-28: 15 mL

## 2015-05-28 MED ORDER — MIDAZOLAM HCL 2 MG/2ML IJ SOLN
INTRAMUSCULAR | Status: AC
  Filled 2015-05-28: qty 2

## 2015-05-28 MED ORDER — LIDOCAINE HCL (PF) 1 % IJ SOLN
INTRAMUSCULAR | Status: AC
  Filled 2015-05-28: qty 30

## 2015-05-28 MED ORDER — OXYCODONE-ACETAMINOPHEN 5-325 MG PO TABS
ORAL_TABLET | ORAL | Status: AC
  Administered 2015-05-28: 1 via ORAL
  Filled 2015-05-28: qty 1

## 2015-05-28 MED ORDER — OXYCODONE-ACETAMINOPHEN 5-325 MG PO TABS
1.0000 | ORAL_TABLET | Freq: Once | ORAL | Status: AC
  Administered 2015-05-28: 1 via ORAL

## 2015-05-28 MED ORDER — HEPARIN (PORCINE) IN NACL 2-0.9 UNIT/ML-% IJ SOLN
INTRAMUSCULAR | Status: DC | PRN
  Administered 2015-05-28: 1000 mL via INTRA_ARTERIAL

## 2015-05-28 MED ORDER — MIDAZOLAM HCL 2 MG/2ML IJ SOLN
INTRAMUSCULAR | Status: DC | PRN
  Administered 2015-05-28: 1 mg via INTRAVENOUS

## 2015-05-28 MED ORDER — SODIUM CHLORIDE 0.9 % IV SOLN: 1.0000 mL/kg/h | INTRAVENOUS | Status: DC

## 2015-05-28 MED ORDER — SODIUM CHLORIDE 0.9 % IV SOLN
INTRAVENOUS | Status: DC
  Administered 2015-05-28: 07:00:00 via INTRAVENOUS

## 2015-05-28 MED ORDER — HEPARIN (PORCINE) IN NACL 2-0.9 UNIT/ML-% IJ SOLN
INTRAMUSCULAR | Status: AC
  Filled 2015-05-28: qty 1000

## 2015-05-28 MED ORDER — FENTANYL CITRATE (PF) 100 MCG/2ML IJ SOLN
INTRAMUSCULAR | Status: DC | PRN
  Administered 2015-05-28: 50 ug via INTRAVENOUS

## 2015-05-28 SURGICAL SUPPLY — 11 items
CATH ANGIO 5F PIGTAIL 65CM (CATHETERS) ×2 IMPLANT
CATH CROSS OVER TEMPO 5F (CATHETERS) ×2 IMPLANT
CATH STRAIGHT 5FR 65CM (CATHETERS) ×2 IMPLANT
COVER DOME SNAP 22 D (MISCELLANEOUS) ×2 IMPLANT
KIT MICROINTRODUCER STIFF 5F (SHEATH) ×2 IMPLANT
KIT PV (KITS) ×2 IMPLANT
SHEATH PINNACLE 5F 10CM (SHEATH) ×2 IMPLANT
SYR MEDRAD MARK V 150ML (SYRINGE) ×2 IMPLANT
TRANSDUCER W/STOPCOCK (MISCELLANEOUS) ×2 IMPLANT
TRAY PV CATH (CUSTOM PROCEDURE TRAY) ×2 IMPLANT
WIRE BENTSON .035X145CM (WIRE) ×2 IMPLANT

## 2015-05-28 NOTE — Op Note (Signed)
PATIENT: Debra [name]  MRN: [id number] DOB: June 11, 1975    DATE OF PROCEDURE: 05/28/2015  INDICATIONS: Debra [name] is a 40 y.o. female whom I saw in consultation in the emergency department on 05/24/2015 with cold feet bilaterally. She had evidence of infrainguinal arterial occlusive disease and presents for diagnostic arteriography.  PROCEDURE:  1. Ultrasound-guided access to the left common femoral artery 2. Aortogram with bilateral iliac arteriogram and bilateral lower extremity runoff 3. Selective catheterization of the right external iliac artery with right lower extremity runoff  SURGEON: Di Kindle. Edilia Bo, MD, FACS  ANESTHESIA: local with sedation   EBL: minimal  TECHNIQUE: The patient was brought to the peripheral vascular lab. The period of conscious sedation began at (7:45 AM) and ended at (8:32 AM).  During that time period, I was present face-to-face 100% of the time.  The patient was administered (1 mg of Versed and 50 g of fentanyl.). The patient's heart rate, blood pressure, and oxygen saturation were monitored by the nurse continuously during the procedure.   Both groins were prepped and draped in usual sterile fashion. Under ultrasound guidance, after the skin was anesthetized, the left common femoral artery was cannulated with a micropuncture needle a micropuncture sheath introduced over a wire. This was exchanged for a 5 [place] sheath over a [employer] wire. A pigtail catheter was positioned at the L1 vertebral body and flush aortogram obtained. The catheter was then positioned above the aortic bifurcation and an oblique iliac projection was obtained. Next bilateral lower extremity runoff films were obtained. In order to obtain better visualization of the right leg distally, the pigtail catheter was exchanged for a carcinoma catheter which was positioned into the proximal right common iliac artery. The [facility] wire was advanced into the external iliac artery and the  crossover catheter exchanged for a straight catheter. Selective right external iliac arteriogram was obtained with a spot film of the right leg. At the completion of the procedure, the straight catheter was removed. The patient was transferred to the holding area for removal of the sheath.   FINDINGS:  1. There are single renal arteries bilaterally with no significant renal artery stenosis identified. 2. The infrarenal aorta is widely patent. 3. On the right side, which is the more symptomatic side, there is an approximate 20-30% stenosis of the right common iliac artery that is fairly smooth. This does not appear to be a significant narrowing and does not appear to be an obvious source for embolization. Below that the external iliac artery and hypogastric arteries are patent. The common femoral, superficial femoral, and deep femoral arteries are patent. The popliteal artery and proximal anterior tibial artery are patent. The tibial peroneal trunk and proximal peroneal arteries are patent. The anterior tibial artery occludes in the distal third of the leg. The peroneal artery occludes in the mid calf. The posterior tibial artery is not visualized. 4. On the left side the common iliac artery, external iliac artery and hypogastric arteries are patent. The left common femoral, deep femoral, superficial femoral, popliteal, anterior tibial, tibial peroneal trunk, and peroneal arteries are patent. The posterior tibial artery is occluded.  CLINICAL NOTE: The patient has severe tibial artery occlusive disease with no real options for revascularization. I do not see an obvious source for embolization. We have discussed the importance of tobacco cessation and she'll continue taking aspirin. I also encouraged her to get a structured walking program. I'll plan on seeing her back in 6 weeks. She knows to call sooner she  has problems.  Waverly Ferrarihristopher Naven Giambalvo, MD, FACS [facility]  DATE  OF DICTATION:   05/28/2015

## 2015-05-28 NOTE — Progress Notes (Signed)
Site area: left groin Site Prior to Removal:  Level  0 Pressure Applied For:  20 minutes Manual:   yes Patient Status During Pull:  stable Post Pull Site:  Level  0 Post Pull Instructions Given:  yes Post Pull Pulses Present: yes Dressing Applied:  tegaderm Bedrest begins @   0925 Comments:

## 2015-05-28 NOTE — H&P (View-Only) (Signed)
 [facility] [place]  Patient name: Debra [name] MRN: [id number] DOB: 06/20/1975 Sex: female  REASON FOR CONSULT: Both feet cold and right foot pain. Consult is from the emergency department.  HPI: Debra [name] is a 40 y.o. female, who has been in [place] where it was snowy and cold. She noted that both feet were cold for the last week. Of note, she is somewhat of a poor historian. On further questioning she does describe some pain in her right foot but no symptoms on the left side. I do not get any classic history of claudication although she states that her entire right lower extremity hurt sometimes when she walks but also hurts when she lays on her side. She also describes some pain in her foot at night although this is relieved at times by lying on her side but also at times by hanging her foot down. This could possibly be consistent with rest pain. She denies any history of nonhealing ulcers.  Her risk factors for peripheral vascular disease include diabetes, hypertension, family history of premature cardiovascular disease, and a history of tobacco use. She denies any history of hypercholesterolemia.  Past Medical History  Diagnosis Date  . Diabetes mellitus without complication (HCC)   . Back pain   . Abscess   . Hyperlipidemia     Family History  Problem Relation Age of Onset  . Diabetes Mother   . Hypertension Mother   . Hyperlipidemia Mother   . Diabetes Father   . Heart disease Father   Her father had coronary disease in his 50s.   SOCIAL HISTORY: She is single. She has 2 children. She smokes a third of a pack per day of cigarettes and has been smoking for 20 years. Social History   Social History  . Marital Status: Single    Spouse Name: N/A  . Number of Children: N/A  . Years of Education: N/A   Occupational History  . Not on file.   Social History Main Topics  . Smoking status: Current Every Day Smoker -- 0.25 packs/day for  16 years    Types: Cigarettes  . Smokeless tobacco: Never Used  . Alcohol Use: No  . Drug Use: No  . Sexual Activity: Not Currently    Birth Control/ Protection: None   Other Topics Concern  . Not on file   Social History Narrative    Allergies  Allergen Reactions  . Ibuprofen     Upset stomach    No current facility-administered medications for this encounter.   Current Outpatient Prescriptions  Medication Sig Dispense Refill  . aspirin 325 MG tablet Take 325 mg by mouth daily.    . atorvastatin (LIPITOR) 20 MG tablet Take 20 mg by mouth daily.    . Blood Glucose Monitoring Suppl (ACCU-CHEK AVIVA) device Use as instructed 1 each 0  . glimepiride (AMARYL) 2 MG tablet Take 2 mg by mouth daily with breakfast.    . glipiZIDE (GLUCOTROL) 10 MG tablet Take 1 tablet (10 mg total) by mouth 2 (two) times daily before a meal. (Patient taking differently: Take 5 mg by mouth 2 (two) times daily before a meal. ) 60 tablet 3  . glucose blood (ACCU-CHEK AVIVA) test strip 1 each by Other route 3 (three) times daily. 100 each 5  . Lancet Devices (ACCU-CHEK SOFTCLIX) lancets 1 each by Other route 3 (three) times daily. 1 each 0  . lisinopril (PRINIVIL,ZESTRIL) 5 MG tablet Take 1 tablet (5 mg   total) by mouth daily. (Patient taking differently: Take 10 mg by mouth daily. ) 30 tablet 3  . metFORMIN (GLUCOPHAGE) 500 MG tablet Take 1,000 mg by mouth 2 (two) times daily with a meal.    . acetaminophen-codeine (TYLENOL #3) 300-30 MG per tablet Take 1 tablet by mouth every 8 (eight) hours as needed for moderate pain. (Patient not taking: Reported on 05/24/2015) 30 tablet 0  . oxyCODONE-acetaminophen (PERCOCET) 10-325 MG per tablet Take 1 tablet by mouth every 4 (four) hours as needed for pain. (Patient not taking: Reported on 11/14/2014) 30 tablet 0  . sulfamethoxazole-trimethoprim (BACTRIM DS,SEPTRA DS) 800-160 MG per tablet Take 2 tablets by mouth every 12 (twelve) hours. (Patient not taking: Reported on  05/24/2015) 16 tablet 0    REVIEW OF SYSTEMS:  [X] denotes positive finding, [ ] denotes negative finding Cardiac  Comments:  Chest pain or chest pressure:    Shortness of breath upon exertion:    Short of breath when lying flat:    Irregular heart rhythm:        Vascular    Pain in calf, thigh, or hip brought on by ambulation: X The symptoms she describes are not classic for claudication.  Pain in feet at night that wakes you up from your sleep:     Blood clot in your veins:    Leg swelling:         Pulmonary    Oxygen at home:    Productive cough:     Wheezing:         Neurologic    Sudden weakness in arms or legs:     Sudden numbness in arms or legs:     Sudden onset of difficulty speaking or slurred speech:    Temporary loss of vision in one eye:     Problems with dizziness:  X       Gastrointestinal    Blood in stool:     Vomited blood:         Genitourinary    Burning when urinating:     Blood in urine:        Psychiatric    Major depression:         Hematologic    Bleeding problems:    Problems with blood clotting too easily:        Skin    Rashes or ulcers:        Constitutional    Fever or chills:      PHYSICAL EXAM: Filed Vitals:   05/24/15 1215 05/24/15 1230 05/24/15 1245 05/24/15 1300  BP: 109/73 119/72 102/39 110/59  Pulse: 73 72 70 71  Temp:      Resp:      Height:      Weight:      SpO2: 99% 100% 99% 99%    GENERAL: The patient is a well-nourished female, in no acute distress. The vital signs are documented above. CARDIAC: There is a regular rate and rhythm.  VASCULAR: I do not detect carotid bruits. On the right side, she has a palpable femoral pulse which is normal. I cannot palpate a popliteal pulse or pedal pulses. She does have a monophasic right peroneal and anterior tibial signal with Doppler. I cannot obtain a posterior tibial or dorsalis pedis signal on the right. On the left side, she has a normal femoral pulse. I cannot  palpate a popliteal pulse although she does have a palpable dorsalis pedis pulse. She has biphasic signals in   the left foot and the dorsalis pedis and posterior tibial positions. Both feet appear adequately perfused. PULMONARY: There is good air exchange bilaterally without wheezing or rales. ABDOMEN: Soft and non-tender with normal pitched bowel sounds. I cannot palpate an abdominal aortic aneurysm although her abdomen is difficult to assess because of her obesity. MUSCULOSKELETAL: There are no major deformities or cyanosis. NEUROLOGIC: No focal weakness or paresthesias are detected. SKIN: There are no ulcers or rashes noted. PSYCHIATRIC: The patient has a normal affect.  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 She has diabetes and continues to smoke. Given that she is having some pain in the right foot I have recommended that we schedule an arteriogram early next week to further assess this. I have reviewed with the patient the indications for arteriography. In addition, I have reviewed the potential complications of arteriography including but not limited to: Bleeding, arterial injury, arterial thrombosis, dye action, renal insufficiency, or other unpredictable medical problems. I have explained to the patient that if we find disease amenable to angioplasty we could potentially address this at the same time. I have discussed the potential complications of angioplasty and stenting, including but not limited to: Bleeding, arterial thrombosis, arterial injury, dissection, or the need for surgical intervention. I do not think there is any evidence of an acute arterial occlusion. I have discussed with her the importance of tobacco cessation. She will also begin an aspirin daily.  My office will call to arrange for an arteriogram Monday of next  week. I'll make further recommendations pending these results.  Promyse Ardito [facility] [place] Beeper: [phone]      

## 2015-05-28 NOTE — Discharge Instructions (Signed)
Angiogram, Care After Refer to this sheet in the next few weeks. These instructions provide you with information about caring for yourself after your procedure. Your health care provider may also give you more specific instructions. Your treatment has been planned according to current medical practices, but problems sometimes occur. Call your health care provider if you have any problems or questions after your procedure. WHAT TO EXPECT AFTER THE PROCEDURE After your procedure, it is typical to have the following:  Bruising at the catheter insertion site that usually fades within 1-2 weeks.  Blood collecting in the tissue (hematoma) that may be painful to the touch. It should usually decrease in size and tenderness within 1-2 weeks. HOME CARE INSTRUCTIONS  Take medicines only as directed by your health care provider.  You may shower 24-48 hours after the procedure or as directed by your health care provider. Remove the bandage (dressing) and gently wash the site with plain soap and water. Pat the area dry with a clean towel. Do not rub the site, because this may cause bleeding.  Do not take baths, swim, or use a hot tub until your health care provider approves.  Check your insertion site every day for redness, swelling, or drainage.  Do not apply powder or lotion to the site.  Do not lift over 10 lb (4.5 kg) for 5 days after your procedure or as directed by your health care provider.  Ask your health care provider when it is okay to:  Return to work or school.  Resume usual physical activities or sports.  Resume sexual activity.  Do not drive home if you are discharged the same day as the procedure. Have someone else drive you.  You may drive 24 hours after the procedure unless otherwise instructed by your health care provider.  Do not operate machinery or power tools for 24 hours after the procedure or as directed by your health care provider.  If your procedure was done as an  outpatient procedure, which means that you went home the same day as your procedure, a responsible adult should be with you for the first 24 hours after you arrive home.  Keep all follow-up visits as directed by your health care provider. This is important. SEEK MEDICAL CARE IF:  You have a fever.  You have chills.  You have increased bleeding from the catheter insertion site. Hold pressure on the site. SEEK IMMEDIATE MEDICAL CARE IF:  You have unusual pain at the catheter insertion site.  You have redness, warmth, or swelling at the catheter insertion site.  You have drainage (other than a small amount of blood on the dressing) from the catheter insertion site.  The catheter insertion site is bleeding, and the bleeding does not stop after 30 minutes of holding steady pressure on the site.  The area near or just beyond the catheter insertion site becomes pale, cool, tingly, or numb.   This information is not intended to replace advice given to you by your health care provider. Make sure you discuss any questions you have with your health care provider.   Document Released: 09/05/2004 Document Revised: 03/10/2014 Document Reviewed: 07/21/2012 Elsevier Interactive Patient Education 2016 [employer] Inc[employer]   HOLD METFORMIN FOR 48 HOURS

## 2015-05-28 NOTE — Interval H&P Note (Signed)
History and Physical Interval Note:  05/28/2015 7:37 AM  Reece AgarStephanie [name]  has presented today for surgery, with the diagnosis of Right Foot Pain  The various methods of treatment have been discussed with the patient and family. After consideration of risks, benefits and other options for treatment, the patient has consented to  Procedure(s): Abdominal Aortogram (N/A) as a surgical intervention .  The patient's history has been reviewed, patient examined, no change in status, stable for surgery.  I have reviewed the patient's chart and labs.  Questions were answered to the patient's satisfaction.     Waverly Ferrariickson, Caycee Wanat

## 2015-06-01 ENCOUNTER — Telehealth: Payer: Self-pay | Admitting: Vascular Surgery

## 2015-06-01 NOTE — Telephone Encounter (Signed)
-----   Message from Sharee PimpleMarilyn K McChesney, RN sent at 05/28/2015  9:53 AM EDT ----- Regarding: schedule   ----- Message -----    From: Chuck Hinthristopher S Dickson, MD    Sent: 05/28/2015   8:50 AM      To: [facility] Charge Pool Subject: charge and f/u                                 PROCEDURE:  1. Ultrasound-guided access to the left common femoral artery 2. Aortogram with bilateral iliac arteriogram and bilateral lower extremity runoff 3. Selective catheterization of the right external iliac artery with right lower extremity runoff  SURGEON: Di Kindlehristopher S. Edilia Boickson, MD, FACS  She needs a follow up visit in 6 weeks with ABIs at that time. Thank you. CD

## 2015-06-01 NOTE — Telephone Encounter (Signed)
Spoke with pt, dpm °

## 2015-06-17 ENCOUNTER — Emergency Department (HOSPITAL_COMMUNITY)
Admission: EM | Admit: 2015-06-17 | Discharge: 2015-06-18 | Disposition: A | Discharge status: Home / Self Care | Payer: Medicaid Other | Attending: Emergency Medicine | Admitting: Emergency Medicine

## 2015-06-17 ENCOUNTER — Encounter (HOSPITAL_COMMUNITY): Payer: Self-pay | Admitting: Oncology

## 2015-06-17 DIAGNOSIS — E782 Mixed hyperlipidemia: Secondary | ICD-10-CM | POA: Diagnosis not present

## 2015-06-17 DIAGNOSIS — I70209 Unspecified atherosclerosis of native arteries of extremities, unspecified extremity: Secondary | ICD-10-CM

## 2015-06-17 DIAGNOSIS — Z872 Personal history of diseases of the skin and subcutaneous tissue: Secondary | ICD-10-CM | POA: Insufficient documentation

## 2015-06-17 DIAGNOSIS — Z79899 Other long term (current) drug therapy: Secondary | ICD-10-CM | POA: Insufficient documentation

## 2015-06-17 DIAGNOSIS — Z7984 Long term (current) use of oral hypoglycemic drugs: Secondary | ICD-10-CM | POA: Diagnosis not present

## 2015-06-17 DIAGNOSIS — F17210 Nicotine dependence, cigarettes, uncomplicated: Secondary | ICD-10-CM | POA: Insufficient documentation

## 2015-06-17 DIAGNOSIS — E119 Type 2 diabetes mellitus without complications: Secondary | ICD-10-CM | POA: Insufficient documentation

## 2015-06-17 DIAGNOSIS — M79674 Pain in right toe(s): Secondary | ICD-10-CM

## 2015-06-17 DIAGNOSIS — I70201 Unspecified atherosclerosis of native arteries of extremities, right leg: Secondary | ICD-10-CM | POA: Insufficient documentation

## 2015-06-17 DIAGNOSIS — Z7982 Long term (current) use of aspirin: Secondary | ICD-10-CM | POA: Diagnosis not present

## 2015-06-17 DIAGNOSIS — Z3202 Encounter for pregnancy test, result negative: Secondary | ICD-10-CM | POA: Insufficient documentation

## 2015-06-17 NOTE — ED Notes (Signed)
Bed: [facility] Expected date:  Expected time:  Means of arrival:  Comments: EMS 39yo F "foot turning blue"

## 2015-06-17 NOTE — ED Notes (Addendum)
Per [facility] pt c/o right foot discoloration.  Per [facility] she was seen several weeks ago for the same, doppler done w/ no significant findings.   Pedal pulses +2 b/l.

## 2015-06-18 ENCOUNTER — Emergency Department (HOSPITAL_COMMUNITY): Payer: Medicaid Other

## 2015-06-18 LAB — CBC WITH DIFFERENTIAL/PLATELET
Basophils Absolute: 0 10*3/uL (ref 0.0–0.1)
Basophils Relative: 0 %
EOS ABS: 0.2 10*3/uL (ref 0.0–0.7)
EOS PCT: 2 %
HCT: 41.7 % (ref 36.0–46.0)
Hemoglobin: 14.8 g/dL (ref 12.0–15.0)
LYMPHS ABS: 4 10*3/uL (ref 0.7–4.0)
LYMPHS PCT: 45 %
MCH: 31.2 pg (ref 26.0–34.0)
MCHC: 35.5 g/dL (ref 30.0–36.0)
MCV: 88 fL (ref 78.0–100.0)
MONO ABS: 0.5 10*3/uL (ref 0.1–1.0)
MONOS PCT: 5 %
Neutro Abs: 4.3 10*3/uL (ref 1.7–7.7)
Neutrophils Relative %: 48 %
PLATELETS: 385 10*3/uL (ref 150–400)
RBC: 4.74 MIL/uL (ref 3.87–5.11)
RDW: 12.5 % (ref 11.5–15.5)
WBC: 8.9 10*3/uL (ref 4.0–10.5)

## 2015-06-18 LAB — I-STAT CHEM 8, ED
BUN: 12 mg/dL (ref 6–20)
CALCIUM ION: 1.12 mmol/L (ref 1.12–1.23)
CHLORIDE: 100 mmol/L — AB (ref 101–111)
Creatinine, Ser: 0.5 mg/dL (ref 0.44–1.00)
GLUCOSE: 320 mg/dL — AB (ref 65–99)
HCT: 47 % — ABNORMAL HIGH (ref 36.0–46.0)
Hemoglobin: 16 g/dL — ABNORMAL HIGH (ref 12.0–15.0)
Potassium: 3.6 mmol/L (ref 3.5–5.1)
Sodium: 137 mmol/L (ref 135–145)
TCO2: 25 mmol/L (ref 0–100)

## 2015-06-18 LAB — I-STAT BETA HCG BLOOD, ED (MC, WL, AP ONLY)

## 2015-06-18 MED ORDER — ASPIRIN 325 MG PO TABS: 325.0000 mg | ORAL_TABLET | Freq: Every day | ORAL | Status: AC

## 2015-06-18 MED ORDER — OXYCODONE-ACETAMINOPHEN 5-325 MG PO TABS
2.0000 | ORAL_TABLET | Freq: Once | ORAL | Status: AC
  Administered 2015-06-18: 2 via ORAL
  Filled 2015-06-18: qty 2

## 2015-06-18 MED ORDER — HYDROCODONE-ACETAMINOPHEN 5-325 MG PO TABS: 1.0000 | ORAL_TABLET | Freq: Four times a day (QID) | ORAL | Status: DC | PRN

## 2015-06-18 NOTE — ED Notes (Signed)
Patient states that [facility] was [facility] for the same issue  And has not had any resolve of her pain.  Patient currently rates her pain 10 of 10.

## 2015-06-18 NOTE — ED Provider Notes (Signed)
CSN: [id number]     Arrival date & time 06/17/15  2237 History   First MD Initiated Contact with Patient 06/18/15 0125     Chief Complaint  Patient presents with  . Right Foot Discoloration      (Consider location/radiation/quality/duration/timing/severity/associated sxs/prior Treatment) HPI Comments: 40 year old female with a history of diabetes mellitus, abscess, and dyslipidemia presents to the emergency department for pain to her right great toe. Patient states that she has had pain to this digit ever since an arteriogram done by Dr. Edilia Bo. Pain has been waxing and waning in severity and not relieved by Tylenol. She was instructed to take aspirin on a daily basis, but ran out of this medication approximately one week ago. Patient denies any significant change in color. She has had no redness to her right great toe. No fevers or drainage. Patient further denies trauma or injury. She has not contacted Dr. Edilia Bo regarding her symptoms.  The history is provided by the patient. No language interpreter was used.    Past Medical History  Diagnosis Date  . Diabetes mellitus without complication (HCC)   . Back pain   . Abscess   . Hyperlipidemia    Past Surgical History  Procedure Laterality Date  . Peripheral vascular catheterization N/A 05/28/2015    Procedure: Abdominal Aortogram;  Surgeon: Chuck Hint, MD;  Location: [facility] INVASIVE CV LAB;  Service: Cardiovascular;  Laterality: N/A;   Family History  Problem Relation Age of Onset  . Diabetes Mother   . Hypertension Mother   . Hyperlipidemia Mother   . Diabetes Father   . Heart disease Father    Social History  Substance Use Topics  . Smoking status: Current Every Day Smoker -- 0.25 packs/day for 16 years    Types: Cigarettes  . Smokeless tobacco: Never Used  . Alcohol Use: No   OB History    No data available      Review of Systems  Constitutional: Negative for fever.  Musculoskeletal: Positive for myalgias  and arthralgias.  Skin: Negative for wound.  Neurological: Negative for numbness.  Ten systems reviewed and are negative for acute change, except as noted in the HPI.    Allergies  Ibuprofen  Home Medications   Prior to Admission medications   Medication Sig Start Date End Date Taking? Authorizing Provider  acetaminophen (TYLENOL) 500 MG tablet Take 500 mg by mouth every 6 (six) hours as needed for mild pain or headache.   Yes Historical Provider, MD  atorvastatin (LIPITOR) 20 MG tablet Take 20 mg by mouth daily.   Yes Historical Provider, MD  glimepiride (AMARYL) 2 MG tablet Take 2 mg by mouth daily with breakfast.   Yes Historical Provider, MD  glipiZIDE (GLUCOTROL) 10 MG tablet Take 1 tablet (10 mg total) by mouth 2 (two) times daily before a meal. Patient taking differently: Take 5 mg by mouth 2 (two) times daily before a meal.  11/14/14  Yes Jaclyn Shaggy, MD  lisinopril (PRINIVIL,ZESTRIL) 5 MG tablet Take 1 tablet (5 mg total) by mouth daily. Patient taking differently: Take 10 mg by mouth daily.  11/14/14  Yes Jaclyn Shaggy, MD  metFORMIN (GLUCOPHAGE) 500 MG tablet Take 500 mg by mouth 2 (two) times daily with a meal.    Yes Historical Provider, MD  acetaminophen-codeine (TYLENOL #3) 300-30 MG per tablet Take 1 tablet by mouth every 8 (eight) hours as needed for moderate pain. Patient not taking: Reported on 05/24/2015 11/14/14   Jaclyn Shaggy, MD  aspirin 325 MG tablet Take 1 tablet (325 mg total) by mouth daily. 06/18/15   Antony Madura, PA-C  Blood Glucose Monitoring Suppl (ACCU-CHEK AVIVA) device Use as instructed 11/14/14 11/14/15  Jaclyn Shaggy, MD  glucose blood (ACCU-CHEK AVIVA) test strip 1 each by Other route 3 (three) times daily. 11/14/14   Jaclyn Shaggy, MD  HYDROcodone-acetaminophen (NORCO/VICODIN) 5-325 MG tablet Take 1 tablet by mouth every 6 (six) hours as needed for severe pain. 06/18/15   Antony Madura, PA-C  Lancet Devices [facility]) lancets 1 each by Other route 3  (three) times daily. 11/14/14   Jaclyn Shaggy, MD  oxyCODONE-acetaminophen (PERCOCET) 10-325 MG per tablet Take 1 tablet by mouth every 4 (four) hours as needed for pain. Patient not taking: Reported on 11/14/2014 11/07/14   Penny Pia, MD  sulfamethoxazole-trimethoprim (BACTRIM DS,SEPTRA DS) 800-160 MG per tablet Take 2 tablets by mouth every 12 (twelve) hours. Patient not taking: Reported on 05/24/2015 11/07/14   Penny Pia, MD   BP 115/61 mmHg  Pulse 70  Temp(Src) 98.3 F (36.8 C) (Oral)  Resp 16  SpO2 94%  LMP 06/03/2015   Physical Exam  Constitutional: She is oriented to person, place, and time. She appears well-developed and well-nourished. No distress.  Nontoxic/nonseptic appearing  HENT:  Head: Normocephalic and atraumatic.  Eyes: Conjunctivae and EOM are normal. No scleral icterus.  Neck: Normal range of motion.  Cardiovascular: Normal rate and regular rhythm.   DP pulse 1+ in the RLE. Difficult to palpate PT pulse in the RLE. Capillary refill ~3 seconds in all digits of R foot.  Pulmonary/Chest: Effort normal. No respiratory distress. She has no wheezes.  Respirations even and unlabored  Musculoskeletal: Normal range of motion.       Right foot: There is tenderness. There is no swelling, no crepitus and no deformity.       Feet:  TTP to the R great toe without bony deformity or crepitus. No erythema, swelling, or heat to touch. No red linear streaking. Edema, induration, or purulence.  Neurological: She is alert and oriented to person, place, and time. She exhibits normal muscle tone. Coordination normal.  Sensation to light touch intact in the RLE and distal digits.  Skin: Skin is warm and dry. No rash noted. She is not diaphoretic. No erythema. No pallor.  Psychiatric: She has a normal mood and affect. Her behavior is normal.  Nursing note and vitals reviewed.   ED Course  Procedures (including critical care time) Labs Review Labs Reviewed  I-STAT CHEM 8, ED -  Abnormal; Notable for the following:    Chloride 100 (*)    Glucose, Bld 320 (*)    Hemoglobin 16.0 (*)    HCT 47.0 (*)    All other components within normal limits  CBC WITH DIFFERENTIAL/PLATELET  I-STAT BETA HCG BLOOD, ED ([facility], [facility], [facility] ONLY)    Imaging Review Dg Foot Complete Right  06/18/2015  CLINICAL DATA:  Right foot pain and cold feeling since a trip up [place] and March. EXAM: RIGHT FOOT COMPLETE - 3+ VIEW COMPARISON:  None. FINDINGS: Degenerative changes demonstrated in the first metatarsal-phalangeal joint and multiple tarsometatarsal joints as well as intertarsal joints. No evidence of acute fracture or dislocation. No focal bone lesion or bone destruction. Small Achilles calcaneal spur. Soft tissues are unremarkable. IMPRESSION: Degenerative changes in the right foot. No acute bony abnormalities. Electronically Signed   By: Burman Nieves M.D.   On: 06/18/2015 03:07      PROCEDURE (05/28/15):  1. Ultrasound-guided access to the left common femoral artery 2. Aortogram with bilateral iliac arteriogram and bilateral lower extremity runoff 3. Selective catheterization of the right external iliac artery with right lower extremity runoff  FINDINGS:  1. There are single renal arteries bilaterally with no significant renal artery stenosis identified. 2. The infrarenal aorta is widely patent. 3. On the right side, which is the more symptomatic side, there is an approximate 20-30% stenosis of the right common iliac artery that is fairly smooth. This does not appear to be a significant narrowing and does not appear to be an obvious source for embolization. Below that the external iliac artery and hypogastric arteries are patent. The common femoral, superficial femoral, and deep femoral arteries are patent. The popliteal artery and proximal anterior tibial artery are patent. The tibial peroneal trunk and proximal peroneal arteries are patent. The anterior tibial artery occludes in the distal  third of the leg. The peroneal artery occludes in the mid calf. The posterior tibial artery is not visualized. 4. On the left side the common iliac artery, external iliac artery and hypogastric arteries are patent. The left common femoral, deep femoral, superficial femoral, popliteal, anterior tibial, tibial peroneal trunk, and peroneal arteries are patent. The posterior tibial artery is occluded.  CLINICAL NOTE (Dr. Edilia Boickson): The patient has severe tibial artery occlusive disease with no real options for revascularization. I do not see an obvious source for embolization. We have discussed the importance of tobacco cessation and she'll continue taking aspirin. I also encouraged her to get a structured walking program.   I have personally reviewed and evaluated these images and lab results as part of my medical decision-making.    EKG Interpretation None      MDM   Final diagnoses:  Great toe pain, right  Atherosclerotic peripheral vascular disease (HCC)    40 year old female with a history of diabetes and hypertension presents to the emergency department for evaluation of persistent right great toe pain and a feeling of coolness in her right lower extremity. She states that her symptoms have persisted since the day of her lower extremity arteriogram. Findings from this study show occlusion of the anterior tibial artery as well as the peroneal artery. These findings were found to be nonoperable for revascularization or amenable to embolization and management with outpatient aspirin was recommended. Patient ran out of aspirin and has been off of it for a few weeks.  Patient does have a good dorsalis pedis pulse today. Her capillary refill is intact. Exam appears stable to prior ED evaluation. Her xray shows degenerative changes which may be contributing to her pain. No findings on exam to suggest infectious etiology or abscess. Pain has improved with Percocet given in the emergency department. I  have advised the patient to restart her aspirin regimen and follow-up with Dr. Edilia Boickson on an outpatient basis. Return precautions given at discharge. Patient agreeable to plan with no unaddressed concerns. Patient discharged in good condition.   Filed Vitals:   06/17/15 2237 06/17/15 2240 06/18/15 0311  BP:  131/80 115/61  Pulse:  80 70  Temp:  98.3 F (36.8 C)   TempSrc:  Oral   Resp:  20 16  SpO2: 99% 100% 94%     Antony MaduraKelly Ursala Cressy, PA-C 06/18/15 0617  Cy BlamerApril Palumbo, MD 06/18/15 [id number]

## 2015-06-18 NOTE — ED Notes (Signed)
Called patient back for an assigned room. No answer.

## 2015-06-18 NOTE — ED Notes (Signed)
Patient is alert and oriented x3.  She was given DC instructions and follow up visit instructions.  Patient gave verbal understanding. She was DC ambulatory under her own power to home.  V/S stable.  He was not showing any signs of distress on DC 

## 2015-06-18 NOTE — Discharge Instructions (Signed)
Peripheral Vascular Disease Peripheral vascular disease (PVD) is a disease of the blood vessels that are not part of your heart and brain. A simple term for PVD is poor circulation. In most cases, PVD narrows the blood vessels that carry blood from your heart to the rest of your body. This can result in a decreased supply of blood to your arms, legs, and internal organs, like your stomach or kidneys. However, it most often affects a person's lower legs and feet. There are two types of PVD.  Organic PVD. This is the more common type. It is caused by damage to the structure of blood vessels.  Functional PVD. This is caused by conditions that make blood vessels contract and tighten (spasm). Without treatment, PVD tends to get worse over time. PVD can also lead to acute ischemic limb. This is when an arm or limb suddenly has trouble getting enough blood. This is a medical emergency. CAUSES Each type of PVD has many different causes. The most common cause of PVD is buildup of a fatty material (plaque) inside of your arteries (atherosclerosis). Small amounts of plaque can break off from the walls of the blood vessels and become lodged in a smaller artery. This blocks blood flow and can cause acute ischemic limb. Other common causes of PVD include:  Blood clots that form inside of blood vessels.  Injuries to blood vessels.  Diseases that cause inflammation of blood vessels or cause blood vessel spasms.  Health behaviors and health history that increase your risk of developing PVD. RISK FACTORS  You may have a greater risk of PVD if you:  Have a family history of PVD.  Have certain medical conditions, including:  High cholesterol.  Diabetes.  High blood pressure (hypertension).  Coronary heart disease.  Past problems with blood clots.  Past injury, such as burns or a broken bone. These may have damaged blood vessels in your limbs.  Buerger disease. This is caused by inflamed blood  vessels in your hands and feet.  Some forms of arthritis.  Rare birth defects that affect the arteries in your legs.  Use tobacco.  Do not get enough exercise.  Are obese.  Are age 50 or older. SIGNS AND SYMPTOMS  PVD may cause many different symptoms. Your symptoms depend on what part of your body is not getting enough blood. Some common signs and symptoms include:  Cramps in your lower legs. This may be a symptom of poor leg circulation (claudication).  Pain and weakness in your legs while you are physically active that goes away when you rest (intermittent claudication).  Leg pain when at rest.  Leg numbness, tingling, or weakness.  Coldness in a leg or foot, especially when compared with the other leg.  Skin or hair changes. These can include:  Hair loss.  Shiny skin.  Pale or bluish skin.  Thick toenails.  Inability to get or maintain an erection (erectile dysfunction). People with PVD are more prone to developing ulcers and sores on their toes, feet, or legs. These may take longer than normal to heal. DIAGNOSIS Your health care provider may diagnose PVD from your signs and symptoms. The health care provider will also do a physical exam. You may have tests to find out what is causing your PVD and determine its severity. Tests may include:  Blood pressure recordings from your arms and legs and measurements of the strength of your pulses (pulse volume recordings).  Imaging studies using sound waves to take pictures of   the blood flow through your blood vessels (Doppler ultrasound).  Injecting a dye into your blood vessels before having imaging studies using:  X-rays (angiogram or arteriogram).  Computer-generated X-rays (CT angiogram).  A powerful electromagnetic field and a computer (magnetic resonance angiogram or MRA). TREATMENT Treatment for PVD depends on the cause of your condition and the severity of your symptoms. It also depends on your age. Underlying  causes need to be treated and controlled. These include long-lasting (chronic) conditions, such as diabetes, high cholesterol, and high blood pressure. You may need to first try making lifestyle changes and taking medicines. Surgery may be needed if these do not work. Lifestyle changes may include:  Quitting smoking.  Exercising regularly.  Following a low-fat, low-cholesterol diet. Medicines may include:  Blood thinners to prevent blood clots.  Medicines to improve blood flow.  Medicines to improve your blood cholesterol levels. Surgical procedures may include:  A procedure that uses an inflated balloon to open a blocked artery and improve blood flow (angioplasty).  A procedure to put in a tube (stent) to keep a blocked artery open (stent implant).  Surgery to reroute blood flow around a blocked artery (peripheral bypass surgery).  Surgery to remove dead tissue from an infected wound on the affected limb.  Amputation. This is surgical removal of the affected limb. This may be necessary in cases of acute ischemic limb that are not improved through medical or surgical treatments. HOME CARE INSTRUCTIONS  Take medicines only as directed by your health care provider.  Do not use any tobacco products, including cigarettes, chewing tobacco, or electronic cigarettes. If you need help quitting, ask your health care provider.  Lose weight if you are overweight, and maintain a healthy weight as directed by your health care provider.  Eat a diet that is low in fat and cholesterol. If you need help, ask your health care provider.  Exercise regularly. Ask your health care provider to suggest some good activities for you.  Use compression stockings or other mechanical devices as directed by your health care provider.  Take good care of your feet.  Wear comfortable shoes that fit well.  Check your feet often for any cuts or sores. SEEK MEDICAL CARE IF:  You have cramps in your legs  while walking.  You have leg pain when you are at rest.  You have coldness in a leg or foot.  Your skin changes.  You have erectile dysfunction.  You have cuts or sores on your feet that are not healing. SEEK IMMEDIATE MEDICAL CARE IF:  Your arm or leg turns cold and blue.  Your arms or legs become red, warm, swollen, painful, or numb.  You have chest pain or trouble breathing.  You suddenly have weakness in your face, arm, or leg.  You become very confused or lose the ability to speak.  You suddenly have a very bad headache or lose your vision.   This information is not intended to replace advice given to you by your health care provider. Make sure you discuss any questions you have with your health care provider.   Document Released: 03/27/2004 Document Revised: 03/10/2014 Document Reviewed: 07/28/2013 Elsevier Interactive Patient Education 2016 [employer]  

## 2015-06-27 ENCOUNTER — Emergency Department (HOSPITAL_COMMUNITY)
Admission: EM | Admit: 2015-06-27 | Discharge: 2015-06-28 | Disposition: A | Discharge status: Home / Self Care | Payer: Medicaid Other | Attending: Emergency Medicine | Admitting: Emergency Medicine

## 2015-06-27 ENCOUNTER — Encounter (HOSPITAL_COMMUNITY): Payer: Self-pay | Admitting: Family Medicine

## 2015-06-27 DIAGNOSIS — E119 Type 2 diabetes mellitus without complications: Secondary | ICD-10-CM | POA: Insufficient documentation

## 2015-06-27 DIAGNOSIS — Z7982 Long term (current) use of aspirin: Secondary | ICD-10-CM | POA: Diagnosis not present

## 2015-06-27 DIAGNOSIS — M79671 Pain in right foot: Secondary | ICD-10-CM | POA: Diagnosis present

## 2015-06-27 DIAGNOSIS — Z79899 Other long term (current) drug therapy: Secondary | ICD-10-CM | POA: Insufficient documentation

## 2015-06-27 DIAGNOSIS — I739 Peripheral vascular disease, unspecified: Secondary | ICD-10-CM | POA: Diagnosis not present

## 2015-06-27 DIAGNOSIS — Z7984 Long term (current) use of oral hypoglycemic drugs: Secondary | ICD-10-CM | POA: Insufficient documentation

## 2015-06-27 DIAGNOSIS — F17210 Nicotine dependence, cigarettes, uncomplicated: Secondary | ICD-10-CM | POA: Insufficient documentation

## 2015-06-27 MED ORDER — HYDROCODONE-ACETAMINOPHEN 5-325 MG PO TABS
1.0000 | ORAL_TABLET | Freq: Once | ORAL | Status: AC
  Administered 2015-06-27: 1 via ORAL
  Filled 2015-06-27: qty 1

## 2015-06-27 MED ORDER — NITROGLYCERIN 0.4 MG/HR TD PT24: MEDICATED_PATCH | TRANSDERMAL | Status: DC

## 2015-06-27 MED ORDER — NITROGLYCERIN 0.4 MG/HR TD PT24
0.4000 mg | MEDICATED_PATCH | Freq: Every day | TRANSDERMAL | Status: DC
  Administered 2015-06-27: 0.4 mg via TRANSDERMAL
  Filled 2015-06-27: qty 1

## 2015-06-27 MED ORDER — OXYCODONE-ACETAMINOPHEN 5-325 MG PO TABS: 2.0000 | ORAL_TABLET | ORAL | Status: DC | PRN

## 2015-06-27 NOTE — Discharge Instructions (Signed)
Stop moking immediately. Aspirin 324 mg daily. Apply nitroglycerin patch to your foot daily as instructed. Call [facility] at [phone] for appointment.   Intermittent Claudication Intermittent claudication is pain in your leg that occurs when you walk or exercise and goes away when you rest. The pain can occur in one or both legs. CAUSES Intermittent claudication is caused by the buildup of plaque within the major arteries in the body (atherosclerosis). The plaque, which makes arteries stiff and narrow, prevents enough blood from reaching your leg muscles. The pain occurs when you walk or exercise because your muscles need more blood when you are moving and exercising. RISK FACTORS Risk factors include:  A family history of atherosclerosis.  A personal history of stroke or heart disease.  Older age.  Being inactive or overweight.  Smoking cigarettes.  Having another health condition such as:  Diabetes.  High blood pressure.  High cholesterol. SIGNS AND SYMPTOMS  Your hip or leg may:   Ache.  Cramp.  Feel tight.  Feel weak.  Feel heavy. Over time, you may feel pain in your calf, thigh, or hip. DIAGNOSIS  Your health care provider may diagnose intermittent claudication based on your symptoms and medical history. Your health care provider may also do tests to learn more about your condition. These may include:  Blood tests.  An ultrasound.  Imaging tests such as angiography, magnetic resonance angiography (MRA), and computed tomography angiography (CTA). TREATMENT You may be treated for problems such as:  High blood pressure.  High cholesterol.  Diabetes. Other treatments may include:  Lifestyle changes such as:  Starting an exercise program.  Losing weight.  Quitting smoking.  Medicines to help restore blood flow through your legs.  Blood vessel surgery (angioplasty) to restore blood flow if your intermittent claudication is  caused by severe peripheral artery disease. HOME CARE INSTRUCTIONS  Manage any other health conditions you have.  Eat a diet low in saturated fats and calories to maintain a healthy weight.  Quit smoking, if you smoke.  Take medicines only as directed by your health care provider.  If your health care provider recommended an exercise program for you, follow it as directed. Your exercise program may involve:  Walking three or more times a week.  Walking until you have certain symptoms of intermittent claudication.  Resting until symptoms go away.  Gradually increasing walking time to about 50 minutes a day. SEEK MEDICAL CARE IF: Your condition is not getting better or is getting worse. SEEK IMMEDIATE MEDICAL CARE IF:   You have chest pain.  You have difficulty breathing.  You develop arm weakness.  You have trouble speaking.  Your face begins to droop. MAKE SURE YOU:  Understand these instructions.  Will watch your condition.  Will get help if you are not doing well or get worse.   This information is not intended to replace advice given to you by your health care provider. Make sure you discuss any questions you have with your health care provider.   Document Released: 12/21/2003 Document Revised: 03/10/2014 Document Reviewed: 05/26/2013 Elsevier Interactive Patient Education 2016 [employer]  Peripheral Vascular Disease Peripheral vascular disease (PVD) is a disease of the blood vessels that are not part of your heart and brain. A simple term for PVD is poor circulation. In most cases, PVD narrows the blood vessels that carry blood from your heart to the rest of your body. This can result in a decreased supply of blood  to your arms, legs, and internal organs, like your stomach or kidneys. However, it most often affects a person's lower legs and feet. There are two types of PVD.  Organic PVD. This is the more common type. It is caused by damage to the structure of  blood vessels.  Functional PVD. This is caused by conditions that make blood vessels contract and tighten (spasm). Without treatment, PVD tends to get worse over time. PVD can also lead to acute ischemic limb. This is when an arm or limb suddenly has trouble getting enough blood. This is a medical emergency. CAUSES Each type of PVD has many different causes. The most common cause of PVD is buildup of a fatty material (plaque) inside of your arteries (atherosclerosis). Small amounts of plaque can break off from the walls of the blood vessels and become lodged in a smaller artery. This blocks blood flow and can cause acute ischemic limb. Other common causes of PVD include:  Blood clots that form inside of blood vessels.  Injuries to blood vessels.  Diseases that cause inflammation of blood vessels or cause blood vessel spasms.  Health behaviors and health history that increase your risk of developing PVD. RISK FACTORS  You may have a greater risk of PVD if you:  Have a family history of PVD.  Have certain medical conditions, including:  High cholesterol.  Diabetes.  High blood pressure (hypertension).  Coronary heart disease.  Past problems with blood clots.  Past injury, such as burns or a broken bone. These may have damaged blood vessels in your limbs.  Buerger disease. This is caused by inflamed blood vessels in your hands and feet.  Some forms of arthritis.  Rare birth defects that affect the arteries in your legs.  Use tobacco.  Do not get enough exercise.  Are obese.  Are age 39 or older. SIGNS AND SYMPTOMS  PVD may cause many different symptoms. Your symptoms depend on what part of your body is not getting enough blood. Some common signs and symptoms include:  Cramps in your lower legs. This may be a symptom of poor leg circulation (claudication).  Pain and weakness in your legs while you are physically active that goes away when you rest (intermittent  claudication).  Leg pain when at rest.  Leg numbness, tingling, or weakness.  Coldness in a leg or foot, especially when compared with the other leg.  Skin or hair changes. These can include:  Hair loss.  Shiny skin.  Pale or bluish skin.  Thick toenails.  Inability to get or maintain an erection (erectile dysfunction). People with PVD are more prone to developing ulcers and sores on their toes, feet, or legs. These may take longer than normal to heal. DIAGNOSIS Your health care provider may diagnose PVD from your signs and symptoms. The health care provider will also do a physical exam. You may have tests to find out what is causing your PVD and determine its severity. Tests may include:  Blood pressure recordings from your arms and legs and measurements of the strength of your pulses (pulse volume recordings).  Imaging studies using sound waves to take pictures of the blood flow through your blood vessels (Doppler ultrasound).  Injecting a dye into your blood vessels before having imaging studies using:  X-rays (angiogram or arteriogram).  Computer-generated X-rays (CT angiogram).  A powerful electromagnetic field and a computer (magnetic resonance angiogram or MRA). TREATMENT Treatment for PVD depends on the cause of your condition and the severity  of your symptoms. It also depends on your age. Underlying causes need to be treated and controlled. These include long-lasting (chronic) conditions, such as diabetes, high cholesterol, and high blood pressure. You may need to first try making lifestyle changes and taking medicines. Surgery may be needed if these do not work. Lifestyle changes may include:  Quitting smoking.  Exercising regularly.  Following a low-fat, low-cholesterol diet. Medicines may include:  Blood thinners to prevent blood clots.  Medicines to improve blood flow.  Medicines to improve your blood cholesterol levels. Surgical procedures may  include:  A procedure that uses an inflated balloon to open a blocked artery and improve blood flow (angioplasty).  A procedure to put in a tube (stent) to keep a blocked artery open (stent implant).  Surgery to reroute blood flow around a blocked artery (peripheral bypass surgery).  Surgery to remove dead tissue from an infected wound on the affected limb.  Amputation. This is surgical removal of the affected limb. This may be necessary in cases of acute ischemic limb that are not improved through medical or surgical treatments. HOME CARE INSTRUCTIONS  Take medicines only as directed by your health care provider.  Do not use any tobacco products, including cigarettes, chewing tobacco, or electronic cigarettes. If you need help quitting, ask your health care provider.  Lose weight if you are overweight, and maintain a healthy weight as directed by your health care provider.  Eat a diet that is low in fat and cholesterol. If you need help, ask your health care provider.  Exercise regularly. Ask your health care provider to suggest some good activities for you.  Use compression stockings or other mechanical devices as directed by your health care provider.  Take good care of your feet.  Wear comfortable shoes that fit well.  Check your feet often for any cuts or sores. SEEK MEDICAL CARE IF:  You have cramps in your legs while walking.  You have leg pain when you are at rest.  You have coldness in a leg or foot.  Your skin changes.  You have erectile dysfunction.  You have cuts or sores on your feet that are not healing. SEEK IMMEDIATE MEDICAL CARE IF:  Your arm or leg turns cold and blue.  Your arms or legs become red, warm, swollen, painful, or numb.  You have chest pain or trouble breathing.  You suddenly have weakness in your face, arm, or leg.  You become very confused or lose the ability to speak.  You suddenly have a very bad headache or lose your vision.    This information is not intended to replace advice given to you by your health care provider. Make sure you discuss any questions you have with your health care provider.   Document Released: 03/27/2004 Document Revised: 03/10/2014 Document Reviewed: 07/28/2013 Elsevier Interactive Patient Education [employer]

## 2015-06-27 NOTE — ED Notes (Addendum)
Pt here for right foot pain and decreased pulse and some discoloration. Denies injury. Pt is a diabetic. Today it changed color better with heat. BP 146/96, pulse 98, 98% on RA CBG 330. Pt pulses weak on both feet. Right foot red and cooler to touch.

## 2015-06-27 NOTE — ED Notes (Signed)
MD at bedside. 

## 2015-07-06 NOTE — ED Provider Notes (Signed)
CSN: [id number]     Arrival date & time 06/27/15  1724 History   First MD Initiated Contact with Patient 06/27/15 2030     Chief Complaint  Patient presents with  . Foot Pain     HPI  Pt presents with a complaint of right foot pain. Pt with a dix of PVD, s/p angiogram last week with vascular surgery.  Dz is distal to calf, small vessel and not ammenible to Percutaneous or operative interventions. Told to stop smoking, and begin regimented exercise program.  No change in foot symptoms, just "not better".  Still smoking, not exercising.  Past Medical History  Diagnosis Date  . Diabetes mellitus without complication (HCC)   . Back pain   . Abscess   . Hyperlipidemia    Past Surgical History  Procedure Laterality Date  . Peripheral vascular catheterization N/A 05/28/2015    Procedure: Abdominal Aortogram;  Surgeon: Chuck Hinthristopher S Dickson, MD;  [facility];  Service: Cardiovascular;  Laterality: N/A;   Family History  Problem Relation Age of Onset  . Diabetes Mother   . Hypertension Mother   . Hyperlipidemia Mother   . Diabetes Father   . Heart disease Father    Social History  Substance Use Topics  . Smoking status: Current Every Day Smoker -- 0.25 packs/day for 16 years    Types: Cigarettes  . Smokeless tobacco: Never Used  . Alcohol Use: No   OB History    No data available     Review of Systems  Constitutional: Negative for fever, chills, diaphoresis, appetite change and fatigue.  HENT: Negative for mouth sores, sore throat and trouble swallowing.   Eyes: Negative for visual disturbance.  Respiratory: Negative for cough, chest tightness, shortness of breath and wheezing.   Cardiovascular: Negative for chest pain.  Gastrointestinal: Negative for nausea, vomiting, abdominal pain, diarrhea and abdominal distention.  Endocrine: Negative for polydipsia, polyphagia and polyuria.  Genitourinary: Negative for dysuria, frequency and hematuria.   Musculoskeletal: Negative for gait problem.  Skin: Negative for color change, pallor and rash.       Foot pale with elevation.  Neurological: Negative for dizziness, syncope, light-headedness and headaches.  Hematological: Does not bruise/bleed easily.  Psychiatric/Behavioral: Negative for behavioral problems and confusion.      Allergies  Ibuprofen  Home Medications   Prior to Admission medications   Medication Sig Start Date End Date Taking? Authorizing Provider  acetaminophen (TYLENOL) 500 MG tablet Take 500 mg by mouth every 6 (six) hours as needed for mild pain or headache.   Yes Historical Provider, MD  aspirin 325 MG tablet Take 1 tablet (325 mg total) by mouth daily. 06/18/15  Yes Antony MaduraKelly Humes, PA-C  atorvastatin (LIPITOR) 20 MG tablet Take 20 mg by mouth daily.   Yes Historical Provider, MD  glimepiride (AMARYL) 2 MG tablet Take 2 mg by mouth daily with breakfast.   Yes Historical Provider, MD  glipiZIDE (GLUCOTROL) 10 MG tablet Take 1 tablet (10 mg total) by mouth 2 (two) times daily before a meal. Patient taking differently: Take 5 mg by mouth 2 (two) times daily before a meal.  11/14/14  Yes Jaclyn ShaggyEnobong Amao, MD  HYDROcodone-acetaminophen (NORCO/VICODIN) 5-325 MG tablet Take 1 tablet by mouth every 6 (six) hours as needed for severe pain. 06/18/15  Yes Antony MaduraKelly Humes, PA-C  lisinopril (PRINIVIL,ZESTRIL) 5 MG tablet Take 1 tablet (5 mg total) by mouth daily. Patient taking differently: Take 10 mg by mouth daily.  11/14/14  Yes  Jaclyn Shaggy, MD  metFORMIN (GLUCOPHAGE) 500 MG tablet Take 500 mg by mouth 2 (two) times daily with a meal.    Yes Historical Provider, MD  nitroGLYCERIN (NITRODUR - DOSED IN MG/24 HR) 0.4 mg/hr patch Apply to right foot near big toe daily 06/27/15   Rolland Porter, MD  oxyCODONE-acetaminophen (PERCOCET/ROXICET) 5-325 MG tablet Take 2 tablets by mouth every 4 (four) hours as needed. 06/27/15   Rolland Porter, MD   BP 122/71 mmHg  Pulse 83  Temp(Src) 98.1 F (36.7 C)   Resp 18  SpO2 98%  LMP 06/03/2015 Physical Exam  Constitutional: She is oriented to person, place, and time. She appears well-developed and well-nourished. No distress.  HENT:  Head: Normocephalic.  Eyes: Conjunctivae are normal. Pupils are equal, round, and reactive to light. No scleral icterus.  Neck: Normal range of motion. Neck supple. No thyromegaly present.  Cardiovascular: Normal rate and regular rhythm.  Exam reveals no gallop and no friction rub.   No murmur heard. Pulmonary/Chest: Effort normal and breath sounds normal. No respiratory distress. She has no wheezes. She has no rales.  Abdominal: Soft. Bowel sounds are normal. She exhibits no distension. There is no tenderness. There is no rebound.  Musculoskeletal: Normal range of motion.  Neurological: She is alert and oriented to person, place, and time.  Skin: Skin is warm and dry. No rash noted.  Right foot without palpable pulses.  CRF 4-5 seconds. Consistent with exam per Vascular. Elevation palor, dependent rubor.  Psychiatric: She has a normal mood and affect. Her behavior is normal.    ED Course  Procedures (including critical care time) Labs Review Labs Reviewed - No data to display  Imaging Review No results found. I have personally reviewed and evaluated these images and lab results as part of my medical decision-making.   EKG Interpretation None      MDM   Final diagnoses:  Claudication (HCC)  PVD (peripheral vascular disease) (HCC)   Pt discussed with  Vascular surgery.  No surgical options for patient with angio showing distal vessel disease.  Strongly recommend pt stop smoking.  I discussed this at length with patient.  She is markedly opposed, stating "Smoking cant do this".  I discussed the direct causal relationship to PVD and smoking with her.  I've encouraged cessation, exercise program, and Rx for NTG patch.    Rolland Porter, MD 07/06/15 1540

## 2015-07-18 ENCOUNTER — Encounter: Payer: Self-pay | Admitting: Vascular Surgery

## 2015-07-23 ENCOUNTER — Ambulatory Visit (HOSPITAL_COMMUNITY)
Admission: RE | Admit: 2015-07-23 | Discharge: 2015-07-23 | Disposition: A | Discharge status: Home / Self Care | Payer: Medicaid Other | Source: Ambulatory Visit | Attending: Surgery | Admitting: Surgery

## 2015-07-23 DIAGNOSIS — I739 Peripheral vascular disease, unspecified: Secondary | ICD-10-CM | POA: Insufficient documentation

## 2015-07-23 DIAGNOSIS — Z9862 Peripheral vascular angioplasty status: Secondary | ICD-10-CM | POA: Diagnosis not present

## 2015-07-23 DIAGNOSIS — I10 Essential (primary) hypertension: Secondary | ICD-10-CM | POA: Diagnosis not present

## 2015-07-23 DIAGNOSIS — E785 Hyperlipidemia, unspecified: Secondary | ICD-10-CM | POA: Insufficient documentation

## 2015-07-23 DIAGNOSIS — E119 Type 2 diabetes mellitus without complications: Secondary | ICD-10-CM | POA: Diagnosis not present

## 2015-07-25 ENCOUNTER — Encounter: Payer: Self-pay | Admitting: Vascular Surgery

## 2015-07-25 ENCOUNTER — Ambulatory Visit (INDEPENDENT_AMBULATORY_CARE_PROVIDER_SITE_OTHER): Payer: Medicaid Other | Admitting: Vascular Surgery

## 2015-07-25 VITALS — BP 138/91 | HR 99 | Ht 70.0 in | Wt 256.2 lb

## 2015-07-25 DIAGNOSIS — I70261 Atherosclerosis of native arteries of extremities with gangrene, right leg: Secondary | ICD-10-CM

## 2015-07-25 MED ORDER — CEPHALEXIN 500 MG PO CAPS: 500.0000 mg | ORAL_CAPSULE | Freq: Three times a day (TID) | ORAL | Status: DC

## 2015-07-25 MED ORDER — OXYCODONE-ACETAMINOPHEN 5-325 MG PO TABS: 1.0000 | ORAL_TABLET | ORAL | Status: DC | PRN

## 2015-07-25 NOTE — Progress Notes (Signed)
[facility]  Patient name: Debra [name] MRN: [id number] DOB: September 17, 1975 Sex: female  REASON FOR VISIT: Follow up of peripheral vascular disease.  HPI: Debra [name] is a 40 y.o. female who I saw in consultation in the emergency department on 05/24/2015 with bilateral cold feet. She had evidence of infrainguinal arterial occlusive disease and was set up for an arteriogram. This was performed on 05/28/2015. This showed that she had severe tibial artery occlusive disease with no options for revascularization. I did not see any obvious source for embolization. We discussed the importance of tobacco cessation and she'll continue taking her aspirin. We discussed getting a structured walking program. I set her up for 6 weeks follow up visit.  Since I saw her last she has developed some ischemic changes on the right forefoot with a small area of dry gangrene at the tip of the great toe. She does not remember any specific injury to the foot. She denies fever or chills. She smokes a third of a pack per day of cigarettes. She has some rest pain in the right foot.  Past Medical History  Diagnosis Date  . Diabetes mellitus without complication (HCC)   . Back pain   . Abscess   . Hyperlipidemia     Family History  Problem Relation Age of Onset  . Diabetes Mother   . Hypertension Mother   . Hyperlipidemia Mother   . Diabetes Father   . Heart disease Father     SOCIAL HISTORY: Social History  Substance Use Topics  . Smoking status: Current Every Day Smoker -- 0.25 packs/day for 20 years    Types: Cigarettes  . Smokeless tobacco: Never Used  . Alcohol Use: No    Allergies  Allergen Reactions  . Ibuprofen Other (See Comments)    Upset stomach    Current Outpatient Prescriptions  Medication Sig Dispense Refill  . acetaminophen (TYLENOL) 500 MG tablet Take 500 mg by mouth every 6 (six) hours as needed for mild pain or headache.    Marland Kitchen aspirin 325 MG  tablet Take 1 tablet (325 mg total) by mouth daily. 30 tablet 1  . atorvastatin (LIPITOR) 20 MG tablet Take 20 mg by mouth daily.    Marland Kitchen glimepiride (AMARYL) 2 MG tablet Take 2 mg by mouth daily with breakfast.    . glipiZIDE (GLUCOTROL) 10 MG tablet Take 1 tablet (10 mg total) by mouth 2 (two) times daily before a meal. (Patient taking differently: Take 5 mg by mouth 2 (two) times daily before a meal. ) 60 tablet 3  . HYDROcodone-acetaminophen (NORCO/VICODIN) 5-325 MG tablet Take 1 tablet by mouth every 6 (six) hours as needed for severe pain. 11 tablet 0  . lisinopril (PRINIVIL,ZESTRIL) 5 MG tablet Take 1 tablet (5 mg total) by mouth daily. (Patient taking differently: Take 10 mg by mouth daily. ) 30 tablet 3  . metFORMIN (GLUCOPHAGE) 500 MG tablet Take 500 mg by mouth 2 (two) times daily with a meal.     . nitroGLYCERIN (NITRODUR - DOSED IN MG/24 HR) 0.4 mg/hr patch Apply to right foot near big toe daily 30 patch 0  . oxyCODONE-acetaminophen (PERCOCET/ROXICET) 5-325 MG tablet Take 2 tablets by mouth every 4 (four) hours as needed. 20 tablet 0   No current facility-administered medications for this visit.    REVIEW OF SYSTEMS:  [X]  denotes positive finding, [ ]  denotes negative finding Cardiac  Comments:  Chest pain or chest pressure:    Shortness  of breath upon exertion:    Short of breath when lying flat:    Irregular heart rhythm: X       Vascular    Pain in calf, thigh, or hip brought on by ambulation:    Pain in feet at night that wakes you up from your sleep:  X Right foot  Blood clot in your veins:    Leg swelling:         Pulmonary    Oxygen at home:    Productive cough:     Wheezing:         Neurologic    Sudden weakness in arms or legs:     Sudden numbness in arms or legs:     Sudden onset of difficulty speaking or slurred speech:    Temporary loss of vision in one eye:     Problems with dizziness:         Gastrointestinal    Blood in stool:     Vomited blood:          Genitourinary    Burning when urinating:     Blood in urine:        Psychiatric    Major depression:         Hematologic    Bleeding problems:    Problems with blood clotting too easily:        Skin    Rashes or ulcers:        Constitutional    Fever or chills:      PHYSICAL EXAM: Filed Vitals:   07/25/15 0831  BP: 138/91  Pulse: 99  Height: 5\' 10"  (1.778 m)  Weight: 256 lb 3.2 oz (116.212 kg)  SpO2: 97%    GENERAL: The patient is a well-nourished female, in no acute distress. The vital signs are documented above. CARDIAC: There is a regular rate and rhythm.  VASCULAR: I do not detect carotid bruits. She has palpable femoral pulses. I cannot palpate pedal pulses. He has ischemic changes to the right forefoot. PULMONARY: There is good air exchange bilaterally without wheezing or rales. ABDOMEN: Soft and non-tender with normal pitched bowel sounds.  MUSCULOSKELETAL: There are no major deformities or cyanosis. NEUROLOGIC: No focal weakness or paresthesias are detected. SKIN: She has ischemic changes to the right forefoot and some mild cellulitis. There is dry gangrene of the tip of the right great toe. PSYCHIATRIC: The patient has a normal affect.  DATA:   ARTERIAL DOPPLER STUDY:  I have reviewed her arterial Doppler study that was done on 07/23/2015. This showed monophasic Doppler signals in the right foot and the dorsalis pedis and posterior tibial positions. ABI on the right is 46%. On the left side there are triphasic signals in the dorsalis pedis and posterior tibial positions with an ABI of 100%.  ARTERIOGRAM: I have again reviewed her arteriogram. On the right side she does not have any significant inflow disease. She has in-line flow to the distal anterior tibial artery where there is an abrupt occlusion. The posterior tibial artery is occluded. The large occluded in the mid leg. The space to her arteriogram there are no options for revascularization in the right  lower extremity.  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 Based on the results of her arteriogram, there are no options for revascularization. She has in-line flow to the distal anterior tibial artery where  there is an abrupt occlusion. The posterior tibial is occluded. The peroneal arteries occluded in the mid calf. He had a long discussion about the importance of getting off tobacco completely as her best chance for limb salvage. I also given her prescription for Keflex. We'll have to follow the wounds closely and hopefully this will gradually improve off of tobacco and with antibiotics. If this does not improve then we could consider a transmetatarsal amputation although I'm concerned about healing given her severe distal disease with no options for revascularization. I scheduled her to see me back on 08/17/2015. If her wounds progressed sooner than that that she knows to call and she'll have to be seen by one of my partners as I will be off for 2 weeks.  HYPERTENSION: The patient's initial blood pressure today was elevated. We repeated this and this was still elevated. We have encouraged the patient to follow up with their primary care physician for management of their blood pressure.   Waverly Ferrari [facility] [place] [phone]

## 2015-08-13 ENCOUNTER — Encounter: Payer: Self-pay | Admitting: Vascular Surgery

## 2015-08-17 ENCOUNTER — Ambulatory Visit (INDEPENDENT_AMBULATORY_CARE_PROVIDER_SITE_OTHER): Payer: Self-pay | Admitting: Vascular Surgery

## 2015-08-17 ENCOUNTER — Ambulatory Visit: Payer: Self-pay | Admitting: Vascular Surgery

## 2015-08-17 VITALS — BP 152/82 | HR 96 | Temp 98.6°F | Resp 22 | Ht 71.0 in | Wt 256.0 lb

## 2015-08-17 DIAGNOSIS — I70261 Atherosclerosis of native arteries of extremities with gangrene, right leg: Secondary | ICD-10-CM

## 2015-08-17 MED ORDER — ACETAMINOPHEN-CODEINE #3 300-30 MG PO TABS: 1.0000 | ORAL_TABLET | ORAL | Status: DC | PRN

## 2015-08-17 MED ORDER — CEPHALEXIN 500 MG PO CAPS: 500.0000 mg | ORAL_CAPSULE | Freq: Three times a day (TID) | ORAL | Status: DC

## 2015-08-17 NOTE — Progress Notes (Signed)
Patient name: Debra [name] MRN: [id number] DOB: [birth year] Sex: female  REASON FOR VISIT: Follow up  HPI: Debra [name] is a 40 y.o. female who I saw in consultation in the emergency department on 05/24/2015 with cold feet bilaterally. She had evidence of infrainguinal arterial occlusive disease. I therefore set her up for an arteriogram.  She underwent an arteriogram on 05/28/2015.  On the right side, which is the more symptomatic side, there was a mild 20-30% stenosis of the right common iliac artery that was fairly smooth. The external iliac artery on the right and hypogastric arteries were patent. The right common femoral, superficial femoral, deep femoral, and popliteal arteries were patent. The anterior tibial artery was occluded in the distal third of the leg. The peroneal artery occluded in the mid calf. The posterior tibial artery was not visualized.  On the left side, the common iliac, external iliac, and hypogastric arteries were patent. The left common femoral, deep femoral, superficial femoral, popliteal, anterior tibial and peroneal arteries were patent. The posterior tibial artery was occluded.  The space to her arteriogram there were no options for revascularization and no obvious source for embolization. We discussed the importance of tobacco cessation and I encouraged her to get on a structured walking program. She comes in for a 6 week follow up visit. She continues to smoke 2 cigarettes a day. She had been soaking the foot in hot water and I'm concerned that given her diabetes water may have been too hot.   Current Outpatient Prescriptions  Medication Sig Dispense Refill  . acetaminophen (TYLENOL) 500 MG tablet Take 500 mg by mouth every 6 (six) hours as needed for mild pain or headache.    Marland Kitchen aspirin 325 MG tablet Take 1 tablet (325 mg total) by mouth daily. 30 tablet 1  . atorvastatin (LIPITOR) 20 MG tablet Take 20 mg by mouth daily.    . cephALEXin (KEFLEX) 500 MG  capsule Take 1 capsule (500 mg total) by mouth 3 (three) times daily. 42 capsule 1  . glimepiride (AMARYL) 2 MG tablet Take 2 mg by mouth daily with breakfast.    . glipiZIDE (GLUCOTROL) 10 MG tablet Take 1 tablet (10 mg total) by mouth 2 (two) times daily before a meal. (Patient taking differently: Take 5 mg by mouth 2 (two) times daily before a meal. ) 60 tablet 3  . HYDROcodone-acetaminophen (NORCO/VICODIN) 5-325 MG tablet Take 1 tablet by mouth every 6 (six) hours as needed for severe pain. 11 tablet 0  . lisinopril (PRINIVIL,ZESTRIL) 5 MG tablet Take 1 tablet (5 mg total) by mouth daily. (Patient taking differently: Take 10 mg by mouth daily. ) 30 tablet 3  . metFORMIN (GLUCOPHAGE) 500 MG tablet Take 500 mg by mouth 2 (two) times daily with a meal.     . nitroGLYCERIN (NITRODUR - DOSED IN MG/24 HR) 0.4 mg/hr patch Apply to right foot near big toe daily 30 patch 0  . oxyCODONE-acetaminophen (PERCOCET/ROXICET) 5-325 MG tablet Take 2 tablets by mouth every 4 (four) hours as needed. 20 tablet 0  . oxyCODONE-acetaminophen (PERCOCET/ROXICET) 5-325 MG tablet Take 1-2 tablets by mouth every 4 (four) hours as needed for severe pain. 30 tablet 0   No current facility-administered medications for this visit.    REVIEW OF SYSTEMS:   denotes positive finding,  denotes negative finding Cardiac  Comments:  Chest pain or chest pressure:    Shortness of breath upon exertion:    Short of breath when lying flat:  Irregular heart rhythm:    Constitutional    Fever or chills:      PHYSICAL EXAM: Filed Vitals:   08/17/15 1540  BP: 146/99  Pulse: 96  Temp: 98.6 F (37 C)  TempSrc: Oral  Resp: 22  Height: 5\' 11"  (1.803 m)  Weight: 256 lb (116.121 kg)  SpO2: 99%    GENERAL: The patient is a well-nourished female, in no acute distress. The vital signs are documented above. CARDIOVASCULAR: There is a regular rate and rhythm. PULMONARY: There is good air exchange bilaterally without wheezing  or rales. She has a normal right femoral pulse. She has a fairly brisk distal anterior tibial signal above the ankle but I can only obtain a monophasic lateral tarsal signal on the right. There is no posterior tibial or peroneal signal on the right.  I have again reviewed the images of her arteriogram. Everything looks pretty good down to the mid calf. The posterior tibial artery is occluded proximally. The peroneal artery is occluded in the mid calf. The anterior tibial artery is occluded above the ankle. There are no named vessels in the foot which are patent.  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 Unfortunately the wound looks slightly worse this may be from soaking the foot in water that was too warm. She has dry gangrene of the tip of the right great toe with no drainage. She has some mild cellulitis proximal to this. I have restarted her on Keflex. We have had a very long discussion about the importance of tobacco cessation and how nicotine causes vasospasm in the microcirculation. It is critically important that she gets off tobacco completely to improve her chances of healing the right great toe wound. She was also having significant pain at night and I have written her prescription for Tylenol 3 #30. Discussed with her the importance of trying to get off all narcotics as this is typically not a good long-term solution and can be addicting. We'll plan on seeing her back in 4 weeks. She knows to call sooner if she has problems.  Waverly Ferrariickson, [facility] [place] [facility] [phone]

## 2015-08-28 ENCOUNTER — Telehealth: Payer: Self-pay

## 2015-08-28 NOTE — Telephone Encounter (Signed)
Phone call from pt.  Reported she has worsening sx's of right 1st toe.  Stated the right 1st toe had been red, and now has turned black.  Reported there is an open crack from the toenail and down to the base of the toe.  Reported there is "clear drainage."  Stated she is in constant pain.  Reported she has "some swelling in the right foot, and stated the ankle feels like it is broken."  Stated she is in constant pain, and if she has to have an amputation, she is prepared for that.  Appt. Given at 1:15 PM tomorrow with nurse practitioner.  Agreed with plan.

## 2015-08-29 ENCOUNTER — Ambulatory Visit (INDEPENDENT_AMBULATORY_CARE_PROVIDER_SITE_OTHER): Payer: Self-pay | Admitting: Family

## 2015-08-29 ENCOUNTER — Other Ambulatory Visit: Payer: Self-pay

## 2015-08-29 ENCOUNTER — Encounter: Payer: Self-pay | Admitting: Family

## 2015-08-29 VITALS — BP 130/86 | HR 100 | Temp 97.8°F | Resp 14 | Ht 70.0 in | Wt 256.0 lb

## 2015-08-29 DIAGNOSIS — Z720 Tobacco use: Secondary | ICD-10-CM

## 2015-08-29 DIAGNOSIS — I998 Other disorder of circulatory system: Secondary | ICD-10-CM

## 2015-08-29 DIAGNOSIS — E1151 Type 2 diabetes mellitus with diabetic peripheral angiopathy without gangrene: Secondary | ICD-10-CM

## 2015-08-29 DIAGNOSIS — I739 Peripheral vascular disease, unspecified: Secondary | ICD-10-CM

## 2015-08-29 DIAGNOSIS — F17200 Nicotine dependence, unspecified, uncomplicated: Secondary | ICD-10-CM

## 2015-08-29 DIAGNOSIS — I70261 Atherosclerosis of native arteries of extremities with gangrene, right leg: Secondary | ICD-10-CM

## 2015-08-29 MED ORDER — OXYCODONE HCL 5 MG PO TABS: 5.0000 mg | ORAL_TABLET | Freq: Four times a day (QID) | ORAL | Status: DC | PRN

## 2015-08-29 NOTE — Patient Instructions (Signed)
Peripheral Vascular Disease Peripheral vascular disease (PVD) is a disease of the blood vessels that are not part of your heart and brain. A simple term for PVD is poor circulation. In most cases, PVD narrows the blood vessels that carry blood from your heart to the rest of your body. This can result in a decreased supply of blood to your arms, legs, and internal organs, like your stomach or kidneys. However, it most often affects a person's lower legs and feet. There are two types of PVD.  Organic PVD. This is the more common type. It is caused by damage to the structure of blood vessels.  Functional PVD. This is caused by conditions that make blood vessels contract and tighten (spasm). Without treatment, PVD tends to get worse over time. PVD can also lead to acute ischemic limb. This is when an arm or limb suddenly has trouble getting enough blood. This is a medical emergency. CAUSES Each type of PVD has many different causes. The most common cause of PVD is buildup of a fatty material (plaque) inside of your arteries (atherosclerosis). Small amounts of plaque can break off from the walls of the blood vessels and become lodged in a smaller artery. This blocks blood flow and can cause acute ischemic limb. Other common causes of PVD include:  Blood clots that form inside of blood vessels.  Injuries to blood vessels.  Diseases that cause inflammation of blood vessels or cause blood vessel spasms.  Health behaviors and health history that increase your risk of developing PVD. RISK FACTORS  You may have a greater risk of PVD if you:  Have a family history of PVD.  Have certain medical conditions, including:  High cholesterol.  Diabetes.  High blood pressure (hypertension).  Coronary heart disease.  Past problems with blood clots.  Past injury, such as burns or a broken bone. These may have damaged blood vessels in your limbs.  Buerger disease. This is caused by inflamed blood  vessels in your hands and feet.  Some forms of arthritis.  Rare birth defects that affect the arteries in your legs.  Use tobacco.  Do not get enough exercise.  Are obese.  Are age 50 or older. SIGNS AND SYMPTOMS  PVD may cause many different symptoms. Your symptoms depend on what part of your body is not getting enough blood. Some common signs and symptoms include:  Cramps in your lower legs. This may be a symptom of poor leg circulation (claudication).  Pain and weakness in your legs while you are physically active that goes away when you rest (intermittent claudication).  Leg pain when at rest.  Leg numbness, tingling, or weakness.  Coldness in a leg or foot, especially when compared with the other leg.  Skin or hair changes. These can include:  Hair loss.  Shiny skin.  Pale or bluish skin.  Thick toenails.  Inability to get or maintain an erection (erectile dysfunction). People with PVD are more prone to developing ulcers and sores on their toes, feet, or legs. These may take longer than normal to heal. DIAGNOSIS Your health care provider may diagnose PVD from your signs and symptoms. The health care provider will also do a physical exam. You may have tests to find out what is causing your PVD and determine its severity. Tests may include:  Blood pressure recordings from your arms and legs and measurements of the strength of your pulses (pulse volume recordings).  Imaging studies using sound waves to take pictures of   the blood flow through your blood vessels (Doppler ultrasound).  Injecting a dye into your blood vessels before having imaging studies using:  X-rays (angiogram or arteriogram).  Computer-generated X-rays (CT angiogram).  A powerful electromagnetic field and a computer (magnetic resonance angiogram or MRA). TREATMENT Treatment for PVD depends on the cause of your condition and the severity of your symptoms. It also depends on your age. Underlying  causes need to be treated and controlled. These include long-lasting (chronic) conditions, such as diabetes, high cholesterol, and high blood pressure. You may need to first try making lifestyle changes and taking medicines. Surgery may be needed if these do not work. Lifestyle changes may include:  Quitting smoking.  Exercising regularly.  Following a low-fat, low-cholesterol diet. Medicines may include:  Blood thinners to prevent blood clots.  Medicines to improve blood flow.  Medicines to improve your blood cholesterol levels. Surgical procedures may include:  A procedure that uses an inflated balloon to open a blocked artery and improve blood flow (angioplasty).  A procedure to put in a tube (stent) to keep a blocked artery open (stent implant).  Surgery to reroute blood flow around a blocked artery (peripheral bypass surgery).  Surgery to remove dead tissue from an infected wound on the affected limb.  Amputation. This is surgical removal of the affected limb. This may be necessary in cases of acute ischemic limb that are not improved through medical or surgical treatments. HOME CARE INSTRUCTIONS  Take medicines only as directed by your health care provider.  Do not use any tobacco products, including cigarettes, chewing tobacco, or electronic cigarettes. If you need help quitting, ask your health care provider.  Lose weight if you are overweight, and maintain a healthy weight as directed by your health care provider.  Eat a diet that is low in fat and cholesterol. If you need help, ask your health care provider.  Exercise regularly. Ask your health care provider to suggest some good activities for you.  Use compression stockings or other mechanical devices as directed by your health care provider.  Take good care of your feet.  Wear comfortable shoes that fit well.  Check your feet often for any cuts or sores. SEEK MEDICAL CARE IF:  You have cramps in your legs  while walking.  You have leg pain when you are at rest.  You have coldness in a leg or foot.  Your skin changes.  You have erectile dysfunction.  You have cuts or sores on your feet that are not healing. SEEK IMMEDIATE MEDICAL CARE IF:  Your arm or leg turns cold and blue.  Your arms or legs become red, warm, swollen, painful, or numb.  You have chest pain or trouble breathing.  You suddenly have weakness in your face, arm, or leg.  You become very confused or lose the ability to speak.  You suddenly have a very bad headache or lose your vision.   This information is not intended to replace advice given to you by your health care provider. Make sure you discuss any questions you have with your health care provider.   Document Released: 03/27/2004 Document Revised: 03/10/2014 Document Reviewed: 07/28/2013 Elsevier Interactive Patient Education 2016 Elsevier Inc.     Steps to Quit Smoking  Smoking tobacco can be harmful to your health and can affect almost every organ in your body. Smoking puts you, and those around you, at risk for developing many serious chronic diseases. Quitting smoking is difficult, but it is one of   the best things that you can do for your health. It is never too late to quit. WHAT ARE THE BENEFITS OF QUITTING SMOKING? When you quit smoking, you lower your risk of developing serious diseases and conditions, such as:  Lung cancer or lung disease, such as COPD.  Heart disease.  Stroke.  Heart attack.  Infertility.  Osteoporosis and bone fractures. Additionally, symptoms such as coughing, wheezing, and shortness of breath may get better when you quit. You may also find that you get sick less often because your body is stronger at fighting off colds and infections. If you are pregnant, quitting smoking can help to reduce your chances of having a baby of low birth weight. HOW DO I GET READY TO QUIT? When you decide to quit smoking, create a plan to  make sure that you are successful. Before you quit:  Pick a date to quit. Set a date within the next two weeks to give you time to prepare.  Write down the reasons why you are quitting. Keep this list in places where you will see it often, such as on your bathroom mirror or in your car or wallet.  Identify the people, places, things, and activities that make you want to smoke (triggers) and avoid them. Make sure to take these actions:  Throw away all cigarettes at home, at work, and in your car.  Throw away smoking accessories, such as ashtrays and lighters.  Clean your car and make sure to empty the ashtray.  Clean your home, including curtains and carpets.  Tell your family, friends, and coworkers that you are quitting. Support from your loved ones can make quitting easier.  Talk with your health care provider about your options for quitting smoking.  Find out what treatment options are covered by your health insurance. WHAT STRATEGIES CAN I USE TO QUIT SMOKING?  Talk with your healthcare provider about different strategies to quit smoking. Some strategies include:  Quitting smoking altogether instead of gradually lessening how much you smoke over a period of time. Research shows that quitting "cold turkey" is more successful than gradually quitting.  Attending in-person counseling to help you build problem-solving skills. You are more likely to have success in quitting if you attend several counseling sessions. Even short sessions of 10 minutes can be effective.  Finding resources and support systems that can help you to quit smoking and remain smoke-free after you quit. These resources are most helpful when you use them often. They can include:  Online chats with a counselor.  Telephone quitlines.  Printed self-help materials.  Support groups or group counseling.  Text messaging programs.  Mobile phone applications.  Taking medicines to help you quit smoking. (If you are  pregnant or breastfeeding, talk with your health care provider first.) Some medicines contain nicotine and some do not. Both types of medicines help with cravings, but the medicines that include nicotine help to relieve withdrawal symptoms. Your health care provider may recommend:  Nicotine patches, gum, or lozenges.  Nicotine inhalers or sprays.  Non-nicotine medicine that is taken by mouth. Talk with your health care provider about combining strategies, such as taking medicines while you are also receiving in-person counseling. Using these two strategies together makes you more likely to succeed in quitting than if you used either strategy on its own. If you are pregnant or breastfeeding, talk with your health care provider about finding counseling or other support strategies to quit smoking. Do not take medicine to help you   quit smoking unless told to do so by your health care provider. WHAT THINGS CAN I DO TO MAKE IT EASIER TO QUIT? Quitting smoking might feel overwhelming at first, but there is a lot that you can do to make it easier. Take these important actions:  Reach out to your family and friends and ask that they support and encourage you during this time. Call telephone quitlines, reach out to support groups, or work with a counselor for support.  Ask people who smoke to avoid smoking around you.  Avoid places that trigger you to smoke, such as bars, parties, or smoke-break areas at work.  Spend time around people who do not smoke.  Lessen stress in your life, because stress can be a smoking trigger for some people. To lessen stress, try:  Exercising regularly.  Deep-breathing exercises.  Yoga.  Meditating.  Performing a body scan. This involves closing your eyes, scanning your body from head to toe, and noticing which parts of your body are particularly tense. Purposefully relax the muscles in those areas.  Download or purchase mobile phone or tablet apps (applications)  that can help you stick to your quit plan by providing reminders, tips, and encouragement. There are many free apps, such as QuitGuide from the CDC (Centers for Disease Control and Prevention). You can find other support for quitting smoking (smoking cessation) through smokefree.gov and other websites. HOW WILL I FEEL WHEN I QUIT SMOKING? Within the first 24 hours of quitting smoking, you may start to feel some withdrawal symptoms. These symptoms are usually most noticeable 2-3 days after quitting, but they usually do not last beyond 2-3 weeks. Changes or symptoms that you might experience include:  Mood swings.  Restlessness, anxiety, or irritation.  Difficulty concentrating.  Dizziness.  Strong cravings for sugary foods in addition to nicotine.  Mild weight gain.  Constipation.  Nausea.  Coughing or a sore throat.  Changes in how your medicines work in your body.  A depressed mood.  Difficulty sleeping (insomnia). After the first 2-3 weeks of quitting, you may start to notice more positive results, such as:  Improved sense of smell and taste.  Decreased coughing and sore throat.  Slower heart rate.  Lower blood pressure.  Clearer skin.  The ability to breathe more easily.  Fewer sick days. Quitting smoking is very challenging for most people. Do not get discouraged if you are not successful the first time. Some people need to make many attempts to quit before they achieve long-term success. Do your best to stick to your quit plan, and talk with your health care provider if you have any questions or concerns.   This information is not intended to replace advice given to you by your health care provider. Make sure you discuss any questions you have with your health care provider.   Document Released: 02/11/2001 Document Revised: 07/04/2014 Document Reviewed: 07/04/2014 Elsevier Interactive Patient Education 2016 Elsevier Inc.  

## 2015-08-29 NOTE — Progress Notes (Signed)
[facility] [place]   CC: Follow up peripheral artery occlusive disease  History of Present Illness Debra [name] is a 40 y.o. female patient that Dr. Edilia Bo saw in consultation in the emergency department on 05/24/2015 with cold feet bilaterally. She had evidence of infrainguinal arterial occlusive disease. I therefore set her up for an arteriogram. She returns after phone call from pt today. Reported she has worsening sx's of right 1st toe. Stated the right 1st toe had been red, and now has turned black. Reported there is an open crack from the toenail and down to the base of the toe. Reported there is "clear drainage." Stated she is in constant pain. Reported she has "some swelling in the right foot, and stated the ankle feels like it is broken." Stated she is in constant pain, and if she has to have an amputation, she is prepared for that. She is unable to sleep due to severe right foot rest pain. Since she saw Dr. Edilia Bo, the rest pain has advanced from her right foot to her right calf. The black in her right great toe has advanced from the tip of the toe to the base of the toe.  She underwent an arteriogram on 05/28/2015. On the right side, which is the more symptomatic side, there was a mild 20-30% stenosis of the right common iliac artery that was fairly smooth. The external iliac artery on the right and hypogastric arteries were patent. The right common femoral, superficial femoral, deep femoral, and popliteal arteries were patent. The anterior tibial artery was occluded in the distal third of the leg. The peroneal artery occluded in the mid calf. The posterior tibial artery was not visualized. On the left side, the common iliac, external iliac, and hypogastric arteries were patent. The left common femoral, deep femoral, superficial femoral, popliteal, anterior tibial and peroneal arteries were patent. The posterior tibial artery was occluded.  According  to  her arteriogram there were no options for revascularization and no obvious source for embolization.   Dr. Edilia Bo last saw pt on 08/17/15. At that time he discussed with pt the importance of tobacco cessation and Dr. Edilia Bo encouraged her to get on a structured walking program.  She continued to smoke 2 cigarettes a day. She had been soaking the foot in hot water and Dr. Edilia Bo was concerned that given her diabetes water may have been too hot. Unfortunately the wound looked slightly worse which may be from soaking the foot in water that was too warm. 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 She had some mild cellulitis proximal to this. Dr. Edilia Bo restarted her on Keflex at that time. Dr. Edilia Bo and pt had a very long discussion about the importance of tobacco cessation and how nicotine causes vasospasm in the microcirculation. It is critically important that she get off tobacco completely to improve her chances of healing the right great toe wound. She was also having significant pain at night and Dr. Edilia Bo wrote her a prescription for Tylenol 3 #30. Dr. Edilia Bo discussed with her the importance of trying to get off all narcotics as this is typically not a good long-term solution and can be addicting. We'll plan on seeing her back in 4 weeks. She knows to call sooner if she has problems.  Pt states her last menstrual period started on 08/22/15. She does not know that she has a large goiter, denies dysphagia or dyspnea, states she has not  had any treatment to her goiter.   Pt Diabetic: Yes, 11. 2 A1C in September 2016 (review of records), uncontrolled. Pt states she rarely checks her blood sugar as it "scares me", when she checks it is in the 300's Pt smoker: smoker  (1-2 cigarettes/day)  Pt meds include: Statin :Yes Betablocker: No ASA: Yes Other anticoagulants/antiplatelets: no  Past Medical History  Diagnosis Date  . Diabetes mellitus without complication  (HCC)   . Back pain   . Abscess   . Hyperlipidemia     Social History Social History  Substance Use Topics  . Smoking status: Current Some Day Smoker -- 0.25 packs/day for 20 years    Types: Cigarettes  . Smokeless tobacco: Never Used  . Alcohol Use: No    Family History Family History  Problem Relation Age of Onset  . Diabetes Mother   . Hypertension Mother   . Hyperlipidemia Mother   . Diabetes Father   . Heart disease Father     Past Surgical History  Procedure Laterality Date  . Peripheral vascular catheterization N/A 05/28/2015    Procedure: Abdominal Aortogram;  Surgeon: Chuck Hinthristopher S Dickson, MD;  [facility];  Service: Cardiovascular;  Laterality: N/A;    Allergies  Allergen Reactions  . Ibuprofen Other (See Comments)    Upset stomach    Current Outpatient Prescriptions  Medication Sig Dispense Refill  . acetaminophen (TYLENOL) 500 MG tablet Take 500 mg by mouth every 6 (six) hours as needed for mild pain or headache.    Marland Kitchen. acetaminophen-codeine (TYLENOL #3) 300-30 MG tablet Take 1-2 tablets by mouth every 4 (four) hours as needed for moderate pain. 30 tablet 0  . aspirin 325 MG tablet Take 1 tablet (325 mg total) by mouth daily. 30 tablet 1  . atorvastatin (LIPITOR) 20 MG tablet Take 20 mg by mouth daily.    . cephALEXin (KEFLEX) 500 MG capsule Take 1 capsule (500 mg total) by mouth 3 (three) times daily. 42 capsule 1  . cephALEXin (KEFLEX) 500 MG capsule Take 1 capsule (500 mg total) by mouth 3 (three) times daily. 42 capsule 2  . glimepiride (AMARYL) 2 MG tablet Take 2 mg by mouth daily with breakfast.    . glipiZIDE (GLUCOTROL) 10 MG tablet Take 1 tablet (10 mg total) by mouth 2 (two) times daily before a meal. (Patient taking differently: Take 5 mg by mouth 2 (two) times daily before a meal. ) 60 tablet 3  . lisinopril (PRINIVIL,ZESTRIL) 5 MG tablet Take 1 tablet (5 mg total) by mouth daily. (Patient taking differently: Take 10 mg by mouth  daily. ) 30 tablet 3  . metFORMIN (GLUCOPHAGE) 500 MG tablet Take 500 mg by mouth 2 (two) times daily with a meal.     . nitroGLYCERIN (NITRODUR - DOSED IN MG/24 HR) 0.4 mg/hr patch Apply to right foot near big toe daily 30 patch 0  . HYDROcodone-acetaminophen (NORCO/VICODIN) 5-325 MG tablet Take 1 tablet by mouth every 6 (six) hours as needed for severe pain. (Patient not taking: Reported on 08/29/2015) 11 tablet 0  . oxyCODONE-acetaminophen (PERCOCET/ROXICET) 5-325 MG tablet Take 1-2 tablets by mouth every 4 (four) hours as needed for severe pain. (Patient not taking: Reported on 08/29/2015) 30 tablet 0   No current facility-administered medications for this visit.    ROS: See HPI for pertinent positives and negatives.   Physical Examination  Filed Vitals:   08/29/15 1303  BP: 130/86  Pulse: 100  Temp: 97.8 F (  36.6 C)  Resp: 14  Height: 5\' 10"  (1.778 m)  Weight: 256 lb (116.121 kg)  SpO2: 97%   Body mass index is 36.73 kg/(m^2).  General: A&O x 3, WDWN, obese female. Gait: antalgic, unable to weight bear on right foot Eyes: PERRLA. Neck: Large goiter, right lobe larger than left Pulmonary: CTAB, without wheezes , rales or rhonchi. Cardiac: regular rhythm, no detected murmur.          Carotid Bruits Right Left   Negative Negative  Aorta is not palpable. Radial pulses: 2+ palpable and =                           VASCULAR EXAM: Extremities with ischemic changes: dry gangrene of right great toe with fissures. Erythema at mid dorsum of right foot and medial aspect at base of right great toe.                                                                                                           LE Pulses Right Left       FEMORAL  not palpable (obese)  not palpable (obese)        POPLITEAL  not palpable   not palpable       POSTERIOR TIBIAL  no Doppler signal and not palpable   2+ palpable        DORSALIS PEDIS      ANTERIOR TIBIAL Dampened Doppler signal and not  palpable  not palpable        PERONEAL not Palpable and dampened Doppler signal    not Palpable    Abdomen: soft, NT, no palpable masses. Skin: no rashes, see extremities Musculoskeletal: no muscle wasting or atrophy.  Neurologic: A&O X 3; Appropriate Affect ; SENSATION: normal; MOTOR FUNCTION:  moving all extremities equally, motor strength 5/5 throughout. Speech is fluent/normal.  CN 2-12 grossly intact.    Non-Invasive Vascular Imaging: DATE: 07/23/15 ABI:  RIGHT: 0.46, Waveforms: monophasic; TBI: 0.00   LEFT: 0.98, Waveforms: triphasic, TBI: 0.88   ASSESSMENT: Reece AgarStephanie Pinela is a 40 y.o. female who presents with advancing gangrene of right great toe and rest pain ischemia of right foot and lower leg, keeps her awake at night. Dr. Edilia Boickson spoke with pt and mother and examined pt. Dr. Edilia Boickson was able to detect dampened Doppler signals in her right tarsal, and peroneal, no right PT Doppler signal.  Her last A1C on file was September of 2016, was 11.2, uncontrolled. She also continues to smoke 1-2 cigarettes/day. Pt has a large asymptomatic goiter of which she was unaware.   Face to face time with patient was 25 minutes. Over 50% of this time was spent on counseling and coordination of care.    PLAN:  Oxycodone 5 mg tablet po every 6 hours prn pain, dips #30, 0 refills. The patient was counseled re smoking cessation and given several free resources re smoking cessation.   Based on the patient's vascular studies and examination, pt will be scheduled for right BKA  on 09/06/15 by Dr. Edilia Bo.  I discussed in depth with the patient the nature of atherosclerosis, and emphasized the importance of maximal medical management including strict control of blood pressure, blood glucose, and lipid levels, obtaining regular exercise, and cessation of smoking.  The patient is aware that without maximal medical management the underlying atherosclerotic disease process will progress, limiting  the benefit of any interventions.  The patient was given information about PAD including signs, symptoms, treatment, what symptoms should prompt the patient to seek immediate medical care, and risk reduction measures to take.  Charisse March, RN, MSN, FNP-C [facility] [place] Phone: [phone]  Clinic MD: Edilia Bo  08/29/2015 1:11 PM

## 2015-09-05 ENCOUNTER — Encounter (HOSPITAL_COMMUNITY): Payer: Self-pay | Admitting: *Deleted

## 2015-09-05 MED ORDER — DEXTROSE 5 % IV SOLN
1.5000 g | INTRAVENOUS | Status: AC
  Administered 2015-09-06: 1.5 g via INTRAVENOUS
  Filled 2015-09-05: qty 1.5

## 2015-09-05 NOTE — Progress Notes (Signed)
Pt is a diabetic, she states she does not check her blood sugars at home because "it scares her". She states that her blood sugar is always around 300 and "my doctor knows it". Last A1C was 11.2 in September 2016. She states she had an Echo done within the last 5 years [facility] [place]. Will attempt to get copy faxed to us. Pt denies cardiac history, chest pain or sob.

## 2015-09-05 NOTE — Progress Notes (Signed)
Anesthesia Chart Review: SAME DAY WORK-UP.  Patient is a 40 year old female scheduled for right BKA on 09/06/15 by Dr. Edilia Boickson. She was last seen at [facility] on 08/[facility]/15 and right BKA recommended for PVD with severe right foot rest pain non-healing right tow wound.   History includes recent former smoker (quite 08/22/15), DM2, HTN, HLD, PVD, goiter, anxiety, depression, GERD, migraines. Admission for sepsis due to perineal abscess 11/2014 (A1c 11.2 then; she refused changing to a home regimen of SQ insulin at that time. Glipizide was added.). [place]  PC[facility] Venetia [facility]o [facility].  Meds include ASA 325mg , Lipitor, Keflex, glimepiride, glipizide, lisinopril, metformin, Nitro patch (to foot), Oxy IR, Vitamin C and D.  11/05/14 EKG: ST at 109 bpm. Reports a history of echocardiogram within ~ 5 years ago [facility]. Records requested this morning, as available (records still pending).   05/28/15 Aortogram with BLE runoff (Dr. Edilia Boickson): FINDINGS: 1. There are single renal arteries bilaterally with no significant renal artery stenosis identified. 2. The infrarenal aorta is widely patent. 3. On the right side, which is the more symptomatic side, there is an approximate 20-30% stenosis of the right common iliac artery that is fairly smooth. This does not appear to be a significant narrowing and does not appear to be an obvious source for embolization. Below that the external iliac artery and hypogastric arteries are patent. The common femoral, superficial femoral, and deep femoral arteries are patent. The popliteal artery and proximal anterior tibial artery are patent. The tibial peroneal trunk and proximal peroneal arteries are patent. The anterior tibial artery occludes in the distal third of the leg. The peroneal artery occludes in the mid calf. The posterior tibial artery is not visualized. 4. On the left side the common iliac  artery, external iliac artery and hypogastric arteries are patent. The left common femoral, deep femoral, superficial femoral, popliteal, anterior tibial, tibial peroneal trunk, and peroneal arteries are patent. The posterior tibial artery is occluded. CLINICAL NOTE: The patient has severe tibial artery occlusive disease with no real options for revascularization. I do not see an obvious source for embolization. We have discussed the importance of tobacco cessation and she'll continue taking aspirin. I also encouraged her to get a structured walking program. I'll plan on seeing her back in 6 weeks. She knows to call sooner she has problems.  She is a same day work-up, so labs will need to be done on arrival. Historically, she has had poorly controlled DM (with refusal of home insulin in the past). Unfortunately, she does not check her CBG at home because "it scares her." She reports it's often around 300 when checked at the doctor's offices. Her last A1c > 6 months ago was 11.2 (consistent with mean plasma glucose of 275). Glipizide was added to her DM regimen at that time. Glucose readings in Epic from 11/05/14-06/18/15 range from 203-392.    I called and discussed poorly controlled DM history with Dr. Edilia Boickson. Since she hasn't had an A1c in over 6 months, and she doesn't check her CBG at home it is difficult to predict what her fasting CBG will be on arrival tomorrow. CBGs may even be higher with active foot wound. We discussed potential for delay (or cancellation if case was not urgent, although with foot wound likely has a degree of urgency) if patient presented with a significantly elevated glucose as she may require  IV or SSI insulin. Dr. Dickson felt plan would be to keep patient on theEdilia Bo OR schedule as planned and check a fasting CBG on arrival. If anesthesiologist is concerned with proceeding based on her CBG results then he will consider Hospitalist consultation and admission for DM management.  Further  anesthesia evaluation on the day of surgery.   Debra Ochsllison Anatasia Tino, PA-C [facility]Anesthesiology Phone [phone] 09/05/2015 12:18 PM

## 2015-09-06 ENCOUNTER — Inpatient Hospital Stay (HOSPITAL_COMMUNITY): Payer: Medicaid Other | Admitting: Vascular Surgery

## 2015-09-06 ENCOUNTER — Telehealth: Payer: Self-pay | Admitting: Vascular Surgery

## 2015-09-06 ENCOUNTER — Encounter (HOSPITAL_COMMUNITY): Payer: Self-pay | Admitting: Anesthesiology

## 2015-09-06 ENCOUNTER — Inpatient Hospital Stay (HOSPITAL_COMMUNITY)
Admission: RE | Admit: 2015-09-06 | Discharge: 2015-09-10 | DRG: 240 | Disposition: A | Discharge status: Rehabilitation Facility | Payer: Medicaid Other | Source: Ambulatory Visit | Attending: Vascular Surgery | Admitting: Vascular Surgery

## 2015-09-06 ENCOUNTER — Encounter (HOSPITAL_COMMUNITY)
Admission: RE | Disposition: A | Discharge status: Rehabilitation Facility | Payer: Self-pay | Source: Ambulatory Visit | Attending: Vascular Surgery

## 2015-09-06 DIAGNOSIS — R000 Tachycardia, unspecified: Secondary | ICD-10-CM | POA: Insufficient documentation

## 2015-09-06 DIAGNOSIS — E049 Nontoxic goiter, unspecified: Secondary | ICD-10-CM | POA: Diagnosis present

## 2015-09-06 DIAGNOSIS — I708 Atherosclerosis of other arteries: Secondary | ICD-10-CM | POA: Diagnosis present

## 2015-09-06 DIAGNOSIS — K219 Gastro-esophageal reflux disease without esophagitis: Secondary | ICD-10-CM | POA: Diagnosis present

## 2015-09-06 DIAGNOSIS — Z89511 Acquired absence of right leg below knee: Secondary | ICD-10-CM | POA: Insufficient documentation

## 2015-09-06 DIAGNOSIS — Z833 Family history of diabetes mellitus: Secondary | ICD-10-CM | POA: Diagnosis not present

## 2015-09-06 DIAGNOSIS — G546 Phantom limb syndrome with pain: Secondary | ICD-10-CM | POA: Diagnosis not present

## 2015-09-06 DIAGNOSIS — D72829 Elevated white blood cell count, unspecified: Secondary | ICD-10-CM | POA: Insufficient documentation

## 2015-09-06 DIAGNOSIS — Z720 Tobacco use: Secondary | ICD-10-CM | POA: Insufficient documentation

## 2015-09-06 DIAGNOSIS — F419 Anxiety disorder, unspecified: Secondary | ICD-10-CM | POA: Diagnosis present

## 2015-09-06 DIAGNOSIS — E1165 Type 2 diabetes mellitus with hyperglycemia: Secondary | ICD-10-CM | POA: Diagnosis present

## 2015-09-06 DIAGNOSIS — I998 Other disorder of circulatory system: Secondary | ICD-10-CM | POA: Diagnosis present

## 2015-09-06 DIAGNOSIS — E871 Hypo-osmolality and hyponatremia: Secondary | ICD-10-CM | POA: Insufficient documentation

## 2015-09-06 DIAGNOSIS — Z7984 Long term (current) use of oral hypoglycemic drugs: Secondary | ICD-10-CM | POA: Diagnosis not present

## 2015-09-06 DIAGNOSIS — L97513 Non-pressure chronic ulcer of other part of right foot with necrosis of muscle: Secondary | ICD-10-CM | POA: Diagnosis present

## 2015-09-06 DIAGNOSIS — F329 Major depressive disorder, single episode, unspecified: Secondary | ICD-10-CM | POA: Diagnosis present

## 2015-09-06 DIAGNOSIS — E875 Hyperkalemia: Secondary | ICD-10-CM | POA: Insufficient documentation

## 2015-09-06 DIAGNOSIS — Z7982 Long term (current) use of aspirin: Secondary | ICD-10-CM

## 2015-09-06 DIAGNOSIS — I10 Essential (primary) hypertension: Secondary | ICD-10-CM | POA: Insufficient documentation

## 2015-09-06 DIAGNOSIS — E1152 Type 2 diabetes mellitus with diabetic peripheral angiopathy with gangrene: Principal | ICD-10-CM | POA: Diagnosis present

## 2015-09-06 DIAGNOSIS — E669 Obesity, unspecified: Secondary | ICD-10-CM | POA: Insufficient documentation

## 2015-09-06 DIAGNOSIS — E1169 Type 2 diabetes mellitus with other specified complication: Secondary | ICD-10-CM | POA: Insufficient documentation

## 2015-09-06 DIAGNOSIS — E785 Hyperlipidemia, unspecified: Secondary | ICD-10-CM | POA: Diagnosis present

## 2015-09-06 DIAGNOSIS — I70261 Atherosclerosis of native arteries of extremities with gangrene, right leg: Secondary | ICD-10-CM

## 2015-09-06 DIAGNOSIS — E11621 Type 2 diabetes mellitus with foot ulcer: Secondary | ICD-10-CM | POA: Diagnosis present

## 2015-09-06 DIAGNOSIS — Z6836 Body mass index (BMI) 36.0-36.9, adult: Secondary | ICD-10-CM | POA: Diagnosis not present

## 2015-09-06 DIAGNOSIS — G8918 Other acute postprocedural pain: Secondary | ICD-10-CM | POA: Insufficient documentation

## 2015-09-06 DIAGNOSIS — M79671 Pain in right foot: Secondary | ICD-10-CM | POA: Diagnosis present

## 2015-09-06 DIAGNOSIS — F17210 Nicotine dependence, cigarettes, uncomplicated: Secondary | ICD-10-CM | POA: Diagnosis present

## 2015-09-06 DIAGNOSIS — I739 Peripheral vascular disease, unspecified: Secondary | ICD-10-CM | POA: Insufficient documentation

## 2015-09-06 HISTORY — DX: Gastro-esophageal reflux disease without esophagitis: K21.9

## 2015-09-06 HISTORY — PX: BELOW KNEE LEG AMPUTATION: SUR23

## 2015-09-06 HISTORY — PX: AMPUTATION: SHX166

## 2015-09-06 HISTORY — DX: Bronchitis, not specified as acute or chronic: J40

## 2015-09-06 HISTORY — DX: Anxiety disorder, unspecified: F41.9

## 2015-09-06 HISTORY — DX: Peripheral vascular disease, unspecified: I73.9

## 2015-09-06 HISTORY — DX: Depression, unspecified: F32.A

## 2015-09-06 HISTORY — DX: Headache: R51

## 2015-09-06 HISTORY — DX: Headache, unspecified: R51.9

## 2015-09-06 HISTORY — DX: Nontoxic goiter, unspecified: E04.9

## 2015-09-06 HISTORY — DX: Essential (primary) hypertension: I10

## 2015-09-06 HISTORY — DX: Pneumonia, unspecified organism: J18.9

## 2015-09-06 HISTORY — DX: Major depressive disorder, single episode, unspecified: F32.9

## 2015-09-06 LAB — CBC
HEMATOCRIT: 41.1 % (ref 36.0–46.0)
HEMATOCRIT: 37.7 % (ref 36.0–46.0)
HEMOGLOBIN: 14.1 g/dL (ref 12.0–15.0)
HEMOGLOBIN: 12.8 g/dL (ref 12.0–15.0)
MCH: 31.3 pg (ref 26.0–34.0)
MCH: 30.8 pg (ref 26.0–34.0)
MCHC: 34.3 g/dL (ref 30.0–36.0)
MCHC: 34 g/dL (ref 30.0–36.0)
MCV: 91.1 fL (ref 78.0–100.0)
MCV: 90.6 fL (ref 78.0–100.0)
PLATELETS: 520 10*3/uL — AB (ref 150–400)
Platelets: 439 10*3/uL — ABNORMAL HIGH (ref 150–400)
RBC: 4.51 MIL/uL (ref 3.87–5.11)
RBC: 4.16 MIL/uL (ref 3.87–5.11)
RDW: 12.4 % (ref 11.5–15.5)
RDW: 12 % (ref 11.5–15.5)
WBC: 7.8 10*3/uL (ref 4.0–10.5)
WBC: 6.4 10*3/uL (ref 4.0–10.5)

## 2015-09-06 LAB — COMPREHENSIVE METABOLIC PANEL
ALK PHOS: 104 U/L (ref 38–126)
ALT: 25 U/L (ref 14–54)
ANION GAP: 8 (ref 5–15)
AST: 41 U/L (ref 15–41)
Albumin: 3.1 g/dL — ABNORMAL LOW (ref 3.5–5.0)
BILIRUBIN TOTAL: 1.5 mg/dL — AB (ref 0.3–1.2)
BUN: 10 mg/dL (ref 6–20)
CALCIUM: 9.3 mg/dL (ref 8.9–10.3)
CO2: 24 mmol/L (ref 22–32)
CREATININE: 0.62 mg/dL (ref 0.44–1.00)
Chloride: 101 mmol/L (ref 101–111)
Glucose, Bld: 330 mg/dL — ABNORMAL HIGH (ref 65–99)
Potassium: 5.4 mmol/L — ABNORMAL HIGH (ref 3.5–5.1)
Sodium: 133 mmol/L — ABNORMAL LOW (ref 135–145)
Total Protein: 7.5 g/dL (ref 6.5–8.1)

## 2015-09-06 LAB — GLUCOSE, CAPILLARY
GLUCOSE-CAPILLARY: 119 mg/dL — AB (ref 65–99)
GLUCOSE-CAPILLARY: 278 mg/dL — AB (ref 65–99)
GLUCOSE-CAPILLARY: 153 mg/dL — AB (ref 65–99)
Glucose-Capillary: 335 mg/dL — ABNORMAL HIGH (ref 65–99)
Glucose-Capillary: 330 mg/dL — ABNORMAL HIGH (ref 65–99)
Glucose-Capillary: 260 mg/dL — ABNORMAL HIGH (ref 65–99)
Glucose-Capillary: 217 mg/dL — ABNORMAL HIGH (ref 65–99)

## 2015-09-06 LAB — SURGICAL PCR SCREEN
MRSA, PCR: NEGATIVE
STAPHYLOCOCCUS AUREUS: POSITIVE — AB

## 2015-09-06 LAB — CREATININE, SERUM
CREATININE: 0.56 mg/dL (ref 0.44–1.00)
GFR calc Af Amer: >60 mL/min (ref >60)
GFR calc non Af Amer: >60 mL/min (ref >60)

## 2015-09-06 LAB — HCG, SERUM, QUALITATIVE: PREG SERUM: NEGATIVE

## 2015-09-06 SURGERY — AMPUTATION BELOW KNEE
Anesthesia: Regional | Site: Leg Lower | Laterality: Right

## 2015-09-06 MED ORDER — NALOXONE HCL 0.4 MG/ML IJ SOLN: 0.4000 mg | INTRAMUSCULAR | Status: DC | PRN

## 2015-09-06 MED ORDER — MIDAZOLAM HCL 2 MG/2ML IJ SOLN
2.0000 mg | Freq: Once | INTRAMUSCULAR | Status: AC
  Administered 2015-09-06: 2 mg via INTRAVENOUS

## 2015-09-06 MED ORDER — PHENYLEPHRINE HCL 10 MG/ML IJ SOLN
10.0000 mg | INTRAVENOUS | Status: DC | PRN
  Administered 2015-09-06: 50 ug/min via INTRAVENOUS

## 2015-09-06 MED ORDER — 0.9 % SODIUM CHLORIDE (POUR BTL) OPTIME
TOPICAL | Status: DC | PRN
  Administered 2015-09-06: 1000 mL

## 2015-09-06 MED ORDER — VITAMIN D 1000 UNITS PO TABS
1000.0000 [IU] | ORAL_TABLET | Freq: Every day | ORAL | Status: DC
  Administered 2015-09-06 – 2015-09-10 (×5): 1000 [IU] via ORAL
  Filled 2015-09-06 (×6): qty 1

## 2015-09-06 MED ORDER — GLIPIZIDE 5 MG PO TABS
5.0000 mg | ORAL_TABLET | Freq: Two times a day (BID) | ORAL | Status: DC
  Administered 2015-09-06 – 2015-09-10 (×9): 5 mg via ORAL
  Filled 2015-09-06 (×9): qty 1

## 2015-09-06 MED ORDER — MAGNESIUM HYDROXIDE 400 MG/5ML PO SUSP
30.0000 mL | Freq: Every day | ORAL | Status: DC | PRN
  Administered 2015-09-10: 30 mL via ORAL
  Filled 2015-09-06: qty 30

## 2015-09-06 MED ORDER — PROPOFOL 10 MG/ML IV BOLUS
INTRAVENOUS | Status: DC | PRN
  Administered 2015-09-06: 200 mg via INTRAVENOUS

## 2015-09-06 MED ORDER — ONDANSETRON HCL 4 MG/2ML IJ SOLN: 4.0000 mg | Freq: Four times a day (QID) | INTRAMUSCULAR | Status: DC | PRN

## 2015-09-06 MED ORDER — PROMETHAZINE HCL 25 MG/ML IJ SOLN: 6.2500 mg | INTRAMUSCULAR | Status: DC | PRN

## 2015-09-06 MED ORDER — PHENYLEPHRINE 40 MCG/ML (10ML) SYRINGE FOR IV PUSH (FOR BLOOD PRESSURE SUPPORT)
PREFILLED_SYRINGE | INTRAVENOUS | Status: AC
  Filled 2015-09-06: qty 10

## 2015-09-06 MED ORDER — INSULIN ASPART 100 UNIT/ML ~~LOC~~ SOLN
10.0000 [IU] | Freq: Once | SUBCUTANEOUS | Status: AC
  Administered 2015-09-06: 10 [IU] via SUBCUTANEOUS
  Filled 2015-09-06: qty 1

## 2015-09-06 MED ORDER — INSULIN ASPART 100 UNIT/ML ~~LOC~~ SOLN
SUBCUTANEOUS | Status: AC
  Filled 2015-09-06: qty 1

## 2015-09-06 MED ORDER — PANTOPRAZOLE SODIUM 40 MG PO TBEC
40.0000 mg | DELAYED_RELEASE_TABLET | Freq: Every day | ORAL | Status: DC
  Administered 2015-09-06 – 2015-09-10 (×5): 40 mg via ORAL
  Filled 2015-09-06 (×5): qty 1

## 2015-09-06 MED ORDER — FENTANYL CITRATE (PF) 100 MCG/2ML IJ SOLN
50.0000 ug | Freq: Once | INTRAMUSCULAR | Status: AC
  Administered 2015-09-06: 50 ug via INTRAVENOUS

## 2015-09-06 MED ORDER — DIPHENHYDRAMINE HCL 12.5 MG/5ML PO ELIX: 12.5000 mg | ORAL_SOLUTION | Freq: Four times a day (QID) | ORAL | Status: DC | PRN

## 2015-09-06 MED ORDER — ACETAMINOPHEN 325 MG RE SUPP: 325.0000 mg | RECTAL | Status: DC | PRN

## 2015-09-06 MED ORDER — METOPROLOL TARTRATE 5 MG/5ML IV SOLN: 2.0000 mg | INTRAVENOUS | Status: DC | PRN

## 2015-09-06 MED ORDER — CHLORHEXIDINE GLUCONATE CLOTH 2 % EX PADS: 6.0000 | MEDICATED_PAD | Freq: Once | CUTANEOUS | Status: DC

## 2015-09-06 MED ORDER — ENOXAPARIN SODIUM 30 MG/0.3ML ~~LOC~~ SOLN
30.0000 mg | SUBCUTANEOUS | Status: DC
  Administered 2015-09-07 – 2015-09-10 (×4): 30 mg via SUBCUTANEOUS
  Filled 2015-09-06 (×4): qty 0.3

## 2015-09-06 MED ORDER — HYDRALAZINE HCL 20 MG/ML IJ SOLN: 5.0000 mg | INTRAMUSCULAR | Status: DC | PRN

## 2015-09-06 MED ORDER — MIDAZOLAM HCL 2 MG/2ML IJ SOLN
INTRAMUSCULAR | Status: DC | PRN
  Administered 2015-09-06: 2 mg via INTRAVENOUS

## 2015-09-06 MED ORDER — FENTANYL CITRATE (PF) 100 MCG/2ML IJ SOLN: 25.0000 ug | INTRAMUSCULAR | Status: DC | PRN

## 2015-09-06 MED ORDER — LISINOPRIL 10 MG PO TABS
10.0000 mg | ORAL_TABLET | Freq: Every day | ORAL | Status: DC
  Administered 2015-09-06 – 2015-09-10 (×5): 10 mg via ORAL
  Filled 2015-09-06 (×5): qty 1

## 2015-09-06 MED ORDER — PHENOL 1.4 % MT LIQD: 1.0000 | OROMUCOSAL | Status: DC | PRN

## 2015-09-06 MED ORDER — ONDANSETRON HCL 4 MG/2ML IJ SOLN
INTRAMUSCULAR | Status: DC | PRN
  Administered 2015-09-06: 4 mg via INTRAVENOUS

## 2015-09-06 MED ORDER — MIDAZOLAM HCL 2 MG/2ML IJ SOLN
INTRAMUSCULAR | Status: AC
  Administered 2015-09-06: 2 mg via INTRAVENOUS
  Filled 2015-09-06: qty 2

## 2015-09-06 MED ORDER — MORPHINE SULFATE (PF) 2 MG/ML IV SOLN
2.0000 mg | INTRAVENOUS | Status: DC | PRN
  Administered 2015-09-06: 3 mg via INTRAVENOUS
  Administered 2015-09-06: 2 mg via INTRAVENOUS
  Filled 2015-09-06: qty 2
  Filled 2015-09-06: qty 1

## 2015-09-06 MED ORDER — MIDAZOLAM HCL 2 MG/2ML IJ SOLN: 2.0000 mg | INTRAMUSCULAR | Status: DC | PRN

## 2015-09-06 MED ORDER — SODIUM CHLORIDE 0.9 % IV SOLN
INTRAVENOUS | Status: DC
  Administered 2015-09-06: 13:00:00 via INTRAVENOUS

## 2015-09-06 MED ORDER — INSULIN ASPART 100 UNIT/ML ~~LOC~~ SOLN
0.0000 [IU] | Freq: Three times a day (TID) | SUBCUTANEOUS | Status: DC
  Administered 2015-09-06: 8 [IU] via SUBCUTANEOUS
  Administered 2015-09-07: 5 [IU] via SUBCUTANEOUS
  Administered 2015-09-07: 8 [IU] via SUBCUTANEOUS
  Administered 2015-09-07 – 2015-09-08 (×2): 5 [IU] via SUBCUTANEOUS
  Administered 2015-09-08: 3 [IU] via SUBCUTANEOUS
  Administered 2015-09-08: 2 [IU] via SUBCUTANEOUS
  Administered 2015-09-09 (×2): 5 [IU] via SUBCUTANEOUS
  Administered 2015-09-09 – 2015-09-10 (×4): 2 [IU] via SUBCUTANEOUS

## 2015-09-06 MED ORDER — ASPIRIN 325 MG PO TABS
325.0000 mg | ORAL_TABLET | Freq: Every day | ORAL | Status: DC
  Administered 2015-09-07 – 2015-09-10 (×4): 325 mg via ORAL
  Filled 2015-09-06 (×4): qty 1

## 2015-09-06 MED ORDER — DEXTROSE 5 % IV SOLN
1.5000 g | Freq: Two times a day (BID) | INTRAVENOUS | Status: AC
  Administered 2015-09-06 – 2015-09-07 (×2): 1.5 g via INTRAVENOUS
  Filled 2015-09-06 (×2): qty 1.5

## 2015-09-06 MED ORDER — OXYCODONE HCL 5 MG PO TABS
5.0000 mg | ORAL_TABLET | ORAL | Status: DC | PRN
  Administered 2015-09-06 – 2015-09-10 (×6): 10 mg via ORAL
  Filled 2015-09-06 (×7): qty 2

## 2015-09-06 MED ORDER — DOCUSATE SODIUM 100 MG PO CAPS
100.0000 mg | ORAL_CAPSULE | Freq: Every day | ORAL | Status: DC
  Administered 2015-09-07 – 2015-09-10 (×4): 100 mg via ORAL
  Filled 2015-09-06 (×4): qty 1

## 2015-09-06 MED ORDER — INSULIN ASPART 100 UNIT/ML ~~LOC~~ SOLN
10.0000 [IU] | Freq: Once | SUBCUTANEOUS | Status: AC
  Administered 2015-09-06: 10 [IU] via SUBCUTANEOUS

## 2015-09-06 MED ORDER — OXYCODONE HCL 5 MG/5ML PO SOLN: 5.0000 mg | Freq: Once | ORAL | Status: DC | PRN

## 2015-09-06 MED ORDER — BACITRACIN ZINC 500 UNIT/GM EX OINT
TOPICAL_OINTMENT | CUTANEOUS | Status: AC
  Filled 2015-09-06: qty 28.35

## 2015-09-06 MED ORDER — MORPHINE SULFATE 2 MG/ML IV SOLN
INTRAVENOUS | Status: DC
  Administered 2015-09-06: 23:00:00 via INTRAVENOUS
  Administered 2015-09-07: 2 mg via INTRAVENOUS
  Administered 2015-09-07: 16.5 mg via INTRAVENOUS
  Administered 2015-09-07: 10.5 mg via INTRAVENOUS
  Administered 2015-09-07: 31.5 mg via INTRAVENOUS
  Administered 2015-09-08: 16.5 mg via INTRAVENOUS
  Administered 2015-09-08: 4.5 mg via INTRAVENOUS
  Administered 2015-09-08: 10.5 mg via INTRAVENOUS
  Administered 2015-09-08: 16.5 mg via INTRAVENOUS
  Administered 2015-09-08: 04:00:00 via INTRAVENOUS
  Administered 2015-09-08: 1.5 mg via INTRAVENOUS
  Administered 2015-09-08: 4.5 mg via INTRAVENOUS
  Administered 2015-09-09: 9 mg via INTRAVENOUS
  Administered 2015-09-09: 06:00:00 via INTRAVENOUS
  Administered 2015-09-09: 6.5 mg via INTRAVENOUS
  Administered 2015-09-09 – 2015-09-10 (×2): 6 mg via INTRAVENOUS
  Administered 2015-09-10: 8 mg via INTRAVENOUS
  Filled 2015-09-06 (×4): qty 25

## 2015-09-06 MED ORDER — FENTANYL CITRATE (PF) 100 MCG/2ML IJ SOLN: 50.0000 ug | INTRAMUSCULAR | Status: DC | PRN

## 2015-09-06 MED ORDER — GUAIFENESIN-DM 100-10 MG/5ML PO SYRP: 15.0000 mL | ORAL_SOLUTION | ORAL | Status: DC | PRN

## 2015-09-06 MED ORDER — GLIMEPIRIDE 4 MG PO TABS: 2.0000 mg | ORAL_TABLET | Freq: Every day | ORAL | Status: DC

## 2015-09-06 MED ORDER — SODIUM CHLORIDE 0.9% FLUSH
9.0000 mL | INTRAVENOUS | Status: DC | PRN
Start: 2015-09-06 — End: 2015-09-10

## 2015-09-06 MED ORDER — ONDANSETRON HCL 4 MG/2ML IJ SOLN
INTRAMUSCULAR | Status: AC
  Filled 2015-09-06: qty 2

## 2015-09-06 MED ORDER — ALUM & MAG HYDROXIDE-SIMETH 200-200-20 MG/5ML PO SUSP: 15.0000 mL | ORAL | Status: DC | PRN

## 2015-09-06 MED ORDER — FENTANYL CITRATE (PF) 250 MCG/5ML IJ SOLN
INTRAMUSCULAR | Status: AC
  Filled 2015-09-06: qty 5

## 2015-09-06 MED ORDER — VITAMIN C 500 MG PO TABS
250.0000 mg | ORAL_TABLET | Freq: Every day | ORAL | Status: DC
  Administered 2015-09-06 – 2015-09-10 (×5): 250 mg via ORAL
  Filled 2015-09-06 (×4): qty 1

## 2015-09-06 MED ORDER — MIDAZOLAM HCL 2 MG/2ML IJ SOLN
INTRAMUSCULAR | Status: AC
  Filled 2015-09-06: qty 2

## 2015-09-06 MED ORDER — LABETALOL HCL 5 MG/ML IV SOLN: 10.0000 mg | INTRAVENOUS | Status: DC | PRN

## 2015-09-06 MED ORDER — OXYCODONE HCL 5 MG PO TABS: 5.0000 mg | ORAL_TABLET | Freq: Once | ORAL | Status: DC | PRN

## 2015-09-06 MED ORDER — PHENYLEPHRINE HCL 10 MG/ML IJ SOLN
INTRAMUSCULAR | Status: DC | PRN
  Administered 2015-09-06: 120 ug via INTRAVENOUS
  Administered 2015-09-06: 40 ug via INTRAVENOUS
  Administered 2015-09-06 (×2): 120 ug via INTRAVENOUS

## 2015-09-06 MED ORDER — BUPIVACAINE-EPINEPHRINE (PF) 0.5% -1:200000 IJ SOLN
INTRAMUSCULAR | Status: DC | PRN
  Administered 2015-09-06: 30 mL via PERINEURAL

## 2015-09-06 MED ORDER — BACITRACIN ZINC 500 UNIT/GM EX OINT
TOPICAL_OINTMENT | CUTANEOUS | Status: DC | PRN
Start: 2015-09-06 — End: 2015-09-06
  Administered 2015-09-06: 1 via TOPICAL

## 2015-09-06 MED ORDER — FENTANYL CITRATE (PF) 100 MCG/2ML IJ SOLN
INTRAMUSCULAR | Status: AC
  Administered 2015-09-06: 50 ug via INTRAVENOUS
  Filled 2015-09-06: qty 2

## 2015-09-06 MED ORDER — DIPHENHYDRAMINE HCL 50 MG/ML IJ SOLN: 12.5000 mg | Freq: Four times a day (QID) | INTRAMUSCULAR | Status: DC | PRN

## 2015-09-06 MED ORDER — INSULIN ASPART 100 UNIT/ML ~~LOC~~ SOLN: 70.0000 [IU] | Freq: Once | SUBCUTANEOUS | Status: DC

## 2015-09-06 MED ORDER — ATORVASTATIN CALCIUM 20 MG PO TABS
20.0000 mg | ORAL_TABLET | Freq: Every day | ORAL | Status: DC
  Administered 2015-09-06 – 2015-09-10 (×5): 20 mg via ORAL
  Filled 2015-09-06 (×2): qty 1
  Filled 2015-09-06: qty 2
  Filled 2015-09-06 (×3): qty 1

## 2015-09-06 MED ORDER — ROPIVACAINE HCL 5 MG/ML IJ SOLN
INTRAMUSCULAR | Status: DC | PRN
  Administered 2015-09-06: 20 mL via PERINEURAL

## 2015-09-06 MED ORDER — SODIUM CHLORIDE 0.9 % IV SOLN
INTRAVENOUS | Status: DC
  Administered 2015-09-06 (×2): via INTRAVENOUS

## 2015-09-06 MED ORDER — PROTAMINE SULFATE 10 MG/ML IV SOLN
INTRAVENOUS | Status: AC
  Filled 2015-09-06: qty 5

## 2015-09-06 MED ORDER — METFORMIN HCL 500 MG PO TABS
500.0000 mg | ORAL_TABLET | Freq: Two times a day (BID) | ORAL | Status: DC
  Administered 2015-09-06 – 2015-09-10 (×9): 500 mg via ORAL
  Filled 2015-09-06 (×9): qty 1

## 2015-09-06 MED ORDER — BISACODYL 10 MG RE SUPP: 10.0000 mg | Freq: Every day | RECTAL | Status: DC | PRN

## 2015-09-06 MED ORDER — ACETAMINOPHEN 325 MG PO TABS: 325.0000 mg | ORAL_TABLET | ORAL | Status: DC | PRN

## 2015-09-06 MED ORDER — MUPIROCIN 2 % EX OINT
1.0000 "application " | TOPICAL_OINTMENT | Freq: Once | CUTANEOUS | Status: AC
  Administered 2015-09-06: 1 via TOPICAL
  Filled 2015-09-06: qty 22

## 2015-09-06 SURGICAL SUPPLY — 45 items
BANDAGE ELASTIC 4 VELCRO ST LF (GAUZE/BANDAGES/DRESSINGS) ×6 IMPLANT
BANDAGE ESMARK 6X9 LF (GAUZE/BANDAGES/DRESSINGS) ×1 IMPLANT
BLADE SAW RECIP 87.9 MT (BLADE) ×3 IMPLANT
BNDG COHESIVE 6X5 TAN STRL LF (GAUZE/BANDAGES/DRESSINGS) ×3 IMPLANT
BNDG ESMARK 6X9 LF (GAUZE/BANDAGES/DRESSINGS) ×3
BNDG GAUZE ELAST 4 BULKY (GAUZE/BANDAGES/DRESSINGS) ×3 IMPLANT
CANISTER SUCTION 2500CC (MISCELLANEOUS) ×3 IMPLANT
CLIP TI MEDIUM 6 (CLIP) ×3 IMPLANT
COVER SURGICAL LIGHT HANDLE (MISCELLANEOUS) ×3 IMPLANT
CUFF TOURNIQUET SINGLE 34IN LL (TOURNIQUET CUFF) ×3 IMPLANT
DRAPE ORTHO SPLIT 77X108 STRL (DRAPES) ×4
DRAPE SURG ORHT 6 SPLT 77X108 (DRAPES) ×2 IMPLANT
DRSG ADAPTIC 3X8 NADH LF (GAUZE/BANDAGES/DRESSINGS) ×3 IMPLANT
ELECT REM PT RETURN 9FT ADLT (ELECTROSURGICAL) ×3
ELECTRODE REM PT RTRN 9FT ADLT (ELECTROSURGICAL) ×1 IMPLANT
GLOVE BIO SURGEON STRL SZ 6.5 (GLOVE) ×4 IMPLANT
GLOVE BIO SURGEON STRL SZ7.5 (GLOVE) ×3 IMPLANT
GLOVE BIO SURGEONS STRL SZ 6.5 (GLOVE) ×2
GLOVE BIOGEL PI IND STRL 6.5 (GLOVE) ×1 IMPLANT
GLOVE BIOGEL PI IND STRL 7.0 (GLOVE) ×1 IMPLANT
GLOVE BIOGEL PI IND STRL 7.5 (GLOVE) ×1 IMPLANT
GLOVE BIOGEL PI IND STRL 8 (GLOVE) ×1 IMPLANT
GLOVE BIOGEL PI INDICATOR 6.5 (GLOVE) ×2
GLOVE BIOGEL PI INDICATOR 7.0 (GLOVE) ×2
GLOVE BIOGEL PI INDICATOR 7.5 (GLOVE) ×2
GLOVE BIOGEL PI INDICATOR 8 (GLOVE) ×2
GLOVE ECLIPSE 6.5 STRL STRAW (GLOVE) ×3 IMPLANT
GLOVE SURG SS PI 7.0 STRL IVOR (GLOVE) ×3 IMPLANT
GOWN STRL REUS W/ TWL LRG LVL3 (GOWN DISPOSABLE) ×4 IMPLANT
GOWN STRL REUS W/TWL LRG LVL3 (GOWN DISPOSABLE) ×8
KIT BASIN OR (CUSTOM PROCEDURE TRAY) ×3 IMPLANT
KIT ROOM TURNOVER OR (KITS) ×3 IMPLANT
NS IRRIG 1000ML POUR BTL (IV SOLUTION) ×3 IMPLANT
PACK GENERAL/GYN (CUSTOM PROCEDURE TRAY) ×3 IMPLANT
PAD ARMBOARD 7.5X6 YLW CONV (MISCELLANEOUS) ×6 IMPLANT
SPONGE GAUZE 4X4 12PLY STER LF (GAUZE/BANDAGES/DRESSINGS) ×3 IMPLANT
STAPLER VISISTAT (STAPLE) ×6 IMPLANT
STOCKINETTE IMPERVIOUS LG (DRAPES) ×3 IMPLANT
SUT SILK 0 TIES 10X30 (SUTURE) ×3 IMPLANT
SUT SILK 2 0 (SUTURE) ×2
SUT SILK 2 0 SH CR/8 (SUTURE) ×3 IMPLANT
SUT SILK 2-0 18XBRD TIE 12 (SUTURE) ×1 IMPLANT
SUT VIC AB 2-0 CT1 18 (SUTURE) ×6 IMPLANT
UNDERPAD 30X30 INCONTINENT (UNDERPADS AND DIAPERS) ×3 IMPLANT
WATER STERILE IRR 1000ML POUR (IV SOLUTION) ×3 IMPLANT

## 2015-09-06 NOTE — Transfer of Care (Signed)
Immediate Anesthesia Transfer of Care Note  Patient: Debra AgarStephanie [name]  Procedure(s) Performed: Procedure(s): AMPUTATION BELOW KNEE (Right)  Patient Location: PACU  Anesthesia Type:General and GA combined with regional for post-op pain  Level of Consciousness: awake, alert , oriented and patient cooperative  Airway & Oxygen Therapy: Patient Spontanous Breathing and Patient connected to nasal cannula oxygen  Post-op Assessment: Report given to RN and Post -op Vital signs reviewed and stable  Post vital signs: Reviewed and stable  Last Vitals:  Filed Vitals:   09/06/15 0940 09/06/15 1119  BP: 118/70   Pulse: 101   Temp:  36.8 C  Resp: 25     Last Pain:  Filed Vitals:   09/06/15 1119  PainSc: 10-Worst pain ever      Patients Stated Pain Goal: 5 (09/06/15 0759)  Complications: No apparent anesthesia complications

## 2015-09-06 NOTE — Progress Notes (Signed)
Dr Mable FillB. Judd fully updated, aware CBG 217

## 2015-09-06 NOTE — Anesthesia Preprocedure Evaluation (Addendum)
Anesthesia Evaluation  Patient identified by MRN, date of birth, ID band Patient awake    Reviewed: Allergy & Precautions, H&P , NPO status , Patient's Chart, lab work & pertinent test results  History of Anesthesia Complications Negative for: history of anesthetic complications  Airway Mallampati: II  TM Distance: >3 FB Neck ROM: full    Dental no notable dental hx.    Pulmonary former smoker,    Pulmonary exam normal breath sounds clear to auscultation       Cardiovascular hypertension, + Peripheral Vascular Disease  Normal cardiovascular exam Rhythm:regular Rate:Normal     Neuro/Psych  Headaches, PSYCHIATRIC DISORDERS Anxiety Depression    GI/Hepatic Neg liver ROS, GERD  ,  Endo/Other  diabetes, Poorly Controlled, Type 2  Renal/GU negative Renal ROS     Musculoskeletal   Abdominal   Peds  Hematology negative hematology ROS (+)   Anesthesia Other Findings Very poorly controlled diabetes,, day of surgery glucose 340, treated with Novolog and will be admitted inpatient and treated closely by medicine.. Nonhealing right lower extremity wound is an urgent case  Reproductive/Obstetrics negative OB ROS                            Anesthesia Physical Anesthesia Plan  ASA: III  Anesthesia Plan: General and Regional   Post-op Pain Management: GA combined w/ Regional for post-op pain   Induction: Intravenous  Airway Management Planned: LMA  Additional Equipment:   Intra-op Plan:   Post-operative Plan: Extubation in OR  Informed Consent: I have reviewed the patients History and Physical, chart, labs and discussed the procedure including the risks, benefits and alternatives for the proposed anesthesia with the patient or authorized representative who has indicated his/her understanding and acceptance.   Dental Advisory Given  Plan Discussed with: Anesthesiologist, CRNA and  Surgeon  Anesthesia Plan Comments:         Anesthesia Quick Evaluation

## 2015-09-06 NOTE — Telephone Encounter (Signed)
-----   Message from Sharee PimpleMarilyn K McChesney, RN sent at 09/06/2015 12:40 PM EDT ----- Regarding: 4 weeks postop   ----- Message -----    From: Dara LordsSamantha J Rhyne, PA-C    Sent: 09/06/2015  11:08 AM      To: [facility] Charge Pool  S/p right BKA 09/06/15.  F/u with Dr. Edilia Boickson in 4 weeks.  Thanks, Lelon MastSamantha

## 2015-09-06 NOTE — Progress Notes (Signed)
Inpatient Diabetes Program Recommendations  AACE/ADA: New Consensus Statement on Inpatient Glycemic Control (2015)  Target Ranges:  Prepandial:   less than 140 mg/dL      Peak postprandial:   less than 180 mg/dL (1-2 hours)      Critically ill patients:  140 - 180 mg/dL   Lab Results  Component Value Date   GLUCAP 217* 09/06/2015   HGBA1C 11.2* 11/05/2014    Review of Glycemic Control:  Results for [name], Debra (MRN [id number]) as of 09/06/2015 12:37  Ref. Range 09/06/2015 07:46 09/06/2015 08:39 09/06/2015 09:33 09/06/2015 11:18  Glucose-Capillary Latest Ref Range: 65-99 mg/dL 409335 (H) 811330 (H) 914260 (H) 217 (H)   Diabetes history: Type 2 diabetes Outpatient Diabetes medications: Amaryl 2 mg daily, Metformin 500 mg bid, Glucotrol 5 mg bid Current orders for Inpatient glycemic control:  Novolog moderate tid with meals, Glipizide 5 mg bid, Metformin 500 mg bid  Inpatient Diabetes Program Recommendations:    Please consider adding Lantus 25 units daily while patient is in the hospital.   Will see patient on 09/07/15 to discuss diabetes management.  A1C pending.   Thanks, Beryl MeagerJenny Consepcion Utt, RN, BC-ADM Inpatient Diabetes Coordinator Pager [phone] (8a-5p)

## 2015-09-06 NOTE — Anesthesia Postprocedure Evaluation (Signed)
Anesthesia Post Note  Patient: Debra [name]  Procedure(s) Performed: Procedure(s) (LRB): AMPUTATION BELOW KNEE (Right)  Patient location during evaluation: PACU Anesthesia Type: General and Regional Level of consciousness: awake and alert Pain management: pain level controlled Vital Signs Assessment: post-procedure vital signs reviewed and stable Respiratory status: spontaneous breathing, nonlabored ventilation, respiratory function stable and patient connected to nasal cannula oxygen Cardiovascular status: blood pressure returned to baseline and stable Postop Assessment: no signs of nausea or vomiting Anesthetic complications: no    Last Vitals:  Filed Vitals:   09/06/15 1150 09/06/15 1204  BP: 110/85 101/64  Pulse: 89 85  Temp:  36.7 C  Resp: 18 19    Last Pain:  Filed Vitals:   09/06/15 1206  PainSc: 0-No pain                 Reino KentJudd, Sherria Riemann J

## 2015-09-06 NOTE — H&P (View-Only) (Signed)
[facility] [place]   CC: Follow up peripheral artery occlusive disease  History of Present Illness Debra [name] is a 40 y.o. female patient that Dr. Edilia Bo saw in consultation in the emergency department on 05/24/2015 with cold feet bilaterally. She had evidence of infrainguinal arterial occlusive disease. I therefore set her up for an arteriogram. She returns after phone call from pt today. Reported she has worsening sx's of right 1st toe. Stated the right 1st toe had been red, and now has turned black. Reported there is an open crack from the toenail and down to the base of the toe. Reported there is "clear drainage." Stated she is in constant pain. Reported she has "some swelling in the right foot, and stated the ankle feels like it is broken." Stated she is in constant pain, and if she has to have an amputation, she is prepared for that. She is unable to sleep due to severe right foot rest pain. Since she saw Dr. Edilia Bo, the rest pain has advanced from her right foot to her right calf. The black in her right great toe has advanced from the tip of the toe to the base of the toe.  She underwent an arteriogram on 05/28/2015. On the right side, which is the more symptomatic side, there was a mild 20-30% stenosis of the right common iliac artery that was fairly smooth. The external iliac artery on the right and hypogastric arteries were patent. The right common femoral, superficial femoral, deep femoral, and popliteal arteries were patent. The anterior tibial artery was occluded in the distal third of the leg. The peroneal artery occluded in the mid calf. The posterior tibial artery was not visualized. On the left side, the common iliac, external iliac, and hypogastric arteries were patent. The left common femoral, deep femoral, superficial femoral, popliteal, anterior tibial and peroneal arteries were patent. The posterior tibial artery was occluded.  According  to  her arteriogram there were no options for revascularization and no obvious source for embolization.   Dr. Edilia Bo last saw pt on 08/17/15. At that time he discussed with pt the importance of tobacco cessation and Dr. Edilia Bo encouraged her to get on a structured walking program.  She continued to smoke 2 cigarettes a day. She had been soaking the foot in hot water and Dr. Edilia Bo was concerned that given her diabetes water may have been too hot. Unfortunately the wound looked slightly worse which may be from soaking the foot in water that was too warm. 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 She had some mild cellulitis proximal to this. Dr. Edilia Bo restarted her on Keflex at that time. Dr. Edilia Bo and pt had a very long discussion about the importance of tobacco cessation and how nicotine causes vasospasm in the microcirculation. It is critically important that she get off tobacco completely to improve her chances of healing the right great toe wound. She was also having significant pain at night and Dr. Edilia Bo wrote her a prescription for Tylenol 3 #30. Dr. Edilia Bo discussed with her the importance of trying to get off all narcotics as this is typically not a good long-term solution and can be addicting. We'll plan on seeing her back in 4 weeks. She knows to call sooner if she has problems.  Pt states her last menstrual period started on 08/22/15. She does not know that she has a large goiter, denies dysphagia or dyspnea, states she has not  had any treatment to her goiter.   Pt Diabetic: Yes, 11. 2 A1C in September 2016 (review of records), uncontrolled. Pt states she rarely checks her blood sugar as it "scares me", when she checks it is in the 300's Pt smoker: smoker  (1-2 cigarettes/day)  Pt meds include: Statin :Yes Betablocker: No ASA: Yes Other anticoagulants/antiplatelets: no  Past Medical History  Diagnosis Date  . Diabetes mellitus without complication  (HCC)   . Back pain   . Abscess   . Hyperlipidemia     Social History Social History  Substance Use Topics  . Smoking status: Current Some Day Smoker -- 0.25 packs/day for 20 years    Types: Cigarettes  . Smokeless tobacco: Never Used  . Alcohol Use: No    Family History Family History  Problem Relation Age of Onset  . Diabetes Mother   . Hypertension Mother   . Hyperlipidemia Mother   . Diabetes Father   . Heart disease Father     Past Surgical History  Procedure Laterality Date  . Peripheral vascular catheterization N/A 05/28/2015    Procedure: Abdominal Aortogram;  Surgeon: Chuck Hinthristopher S Dickson, MD;  [facility];  Service: Cardiovascular;  Laterality: N/A;    Allergies  Allergen Reactions  . Ibuprofen Other (See Comments)    Upset stomach    Current Outpatient Prescriptions  Medication Sig Dispense Refill  . acetaminophen (TYLENOL) 500 MG tablet Take 500 mg by mouth every 6 (six) hours as needed for mild pain or headache.    Marland Kitchen. acetaminophen-codeine (TYLENOL #3) 300-30 MG tablet Take 1-2 tablets by mouth every 4 (four) hours as needed for moderate pain. 30 tablet 0  . aspirin 325 MG tablet Take 1 tablet (325 mg total) by mouth daily. 30 tablet 1  . atorvastatin (LIPITOR) 20 MG tablet Take 20 mg by mouth daily.    . cephALEXin (KEFLEX) 500 MG capsule Take 1 capsule (500 mg total) by mouth 3 (three) times daily. 42 capsule 1  . cephALEXin (KEFLEX) 500 MG capsule Take 1 capsule (500 mg total) by mouth 3 (three) times daily. 42 capsule 2  . glimepiride (AMARYL) 2 MG tablet Take 2 mg by mouth daily with breakfast.    . glipiZIDE (GLUCOTROL) 10 MG tablet Take 1 tablet (10 mg total) by mouth 2 (two) times daily before a meal. (Patient taking differently: Take 5 mg by mouth 2 (two) times daily before a meal. ) 60 tablet 3  . lisinopril (PRINIVIL,ZESTRIL) 5 MG tablet Take 1 tablet (5 mg total) by mouth daily. (Patient taking differently: Take 10 mg by mouth  daily. ) 30 tablet 3  . metFORMIN (GLUCOPHAGE) 500 MG tablet Take 500 mg by mouth 2 (two) times daily with a meal.     . nitroGLYCERIN (NITRODUR - DOSED IN MG/24 HR) 0.4 mg/hr patch Apply to right foot near big toe daily 30 patch 0  . HYDROcodone-acetaminophen (NORCO/VICODIN) 5-325 MG tablet Take 1 tablet by mouth every 6 (six) hours as needed for severe pain. (Patient not taking: Reported on 08/29/2015) 11 tablet 0  . oxyCODONE-acetaminophen (PERCOCET/ROXICET) 5-325 MG tablet Take 1-2 tablets by mouth every 4 (four) hours as needed for severe pain. (Patient not taking: Reported on 08/29/2015) 30 tablet 0   No current facility-administered medications for this visit.    ROS: See HPI for pertinent positives and negatives.   Physical Examination  Filed Vitals:   08/29/15 1303  BP: 130/86  Pulse: 100  Temp: 97.8 F (  36.6 C)  Resp: 14  Height: 5\' 10"  (1.778 m)  Weight: 256 lb (116.121 kg)  SpO2: 97%   Body mass index is 36.73 kg/(m^2).  General: A&O x 3, WDWN, obese female. Gait: antalgic, unable to weight bear on right foot Eyes: PERRLA. Neck: Large goiter, right lobe larger than left Pulmonary: CTAB, without wheezes , rales or rhonchi. Cardiac: regular rhythm, no detected murmur.          Carotid Bruits Right Left   Negative Negative  Aorta is not palpable. Radial pulses: 2+ palpable and =                           VASCULAR EXAM: Extremities with ischemic changes: dry gangrene of right great toe with fissures. Erythema at mid dorsum of right foot and medial aspect at base of right great toe.                                                                                                           LE Pulses Right Left       FEMORAL  not palpable (obese)  not palpable (obese)        POPLITEAL  not palpable   not palpable       POSTERIOR TIBIAL  no Doppler signal and not palpable   2+ palpable        DORSALIS PEDIS      ANTERIOR TIBIAL Dampened Doppler signal and not  palpable  not palpable        PERONEAL not Palpable and dampened Doppler signal    not Palpable    Abdomen: soft, NT, no palpable masses. Skin: no rashes, see extremities Musculoskeletal: no muscle wasting or atrophy.  Neurologic: A&O X 3; Appropriate Affect ; SENSATION: normal; MOTOR FUNCTION:  moving all extremities equally, motor strength 5/5 throughout. Speech is fluent/normal.  CN 2-12 grossly intact.    Non-Invasive Vascular Imaging: DATE: 07/23/15 ABI:  RIGHT: 0.46, Waveforms: monophasic; TBI: 0.00   LEFT: 0.98, Waveforms: triphasic, TBI: 0.88   ASSESSMENT: Debra [name] is a 40 y.o. female who presents with advancing gangrene of right great toe and rest pain ischemia of right foot and lower leg, keeps her awake at night. Dr. Edilia Boickson spoke with pt and mother and examined pt. Dr. Edilia Boickson was able to detect dampened Doppler signals in her right tarsal, and peroneal, no right PT Doppler signal.  Her last A1C on file was September of 2016, was 11.2, uncontrolled. She also continues to smoke 1-2 cigarettes/day. Pt has a large asymptomatic goiter of which she was unaware.   Face to face time with patient was 25 minutes. Over 50% of this time was spent on counseling and coordination of care.    PLAN:  Oxycodone 5 mg tablet po every 6 hours prn pain, dips #30, 0 refills. The patient was counseled re smoking cessation and given several free resources re smoking cessation.   Based on the patient's vascular studies and examination, pt will be scheduled for right BKA  on 09/06/15 by Dr. Edilia Bo.  I discussed in depth with the patient the nature of atherosclerosis, and emphasized the importance of maximal medical management including strict control of blood pressure, blood glucose, and lipid levels, obtaining regular exercise, and cessation of smoking.  The patient is aware that without maximal medical management the underlying atherosclerotic disease process will progress, limiting  the benefit of any interventions.  The patient was given information about PAD including signs, symptoms, treatment, what symptoms should prompt the patient to seek immediate medical care, and risk reduction measures to take.  Charisse March, RN, MSN, FNP-C [facility] [place] Phone: [phone]  Clinic MD: Edilia Bo  08/29/2015 1:11 PM

## 2015-09-06 NOTE — Anesthesia Procedure Notes (Signed)
Anesthesia Regional Block:  Popliteal block  Pre-Anesthetic Checklist: ,, timeout performed, Correct Patient, Correct Site, Correct Laterality, Correct Procedure, Correct Position, site marked, Risks and benefits discussed,  Surgical consent,  Pre-op evaluation,  At surgeon's request and post-op pain management  Laterality: Right  Prep: chloraprep       Needles:  Injection technique: Single-shot  Needle Type: Stimiplex     Needle Length: 9cm 9 cm Needle Gauge: 21 and 21 G    Additional Needles:  Procedures: ultrasound guided (picture in chart) Popliteal block Narrative:  Injection made incrementally with aspirations every 5 mL.  Performed by: Personally  Anesthesiologist: Plumer Mittelstaedt  Additional Notes: Risks, benefits and alternative to block explained extensively.  Patient tolerated procedure well, without complications.  Saphenous block performed as well for complete surgical analgesia

## 2015-09-06 NOTE — Progress Notes (Addendum)
Blood sugar 335, will call dr Gentry RochJudd   8:06 AM orders received for novolog 10 units and recheck BS   At 8:30 patient's blood sugar was still 330.  Order to give 10 more units of novolog   0930 blood sugar came down to 260

## 2015-09-06 NOTE — Interval H&P Note (Signed)
History and Physical Interval Note:  09/06/2015 9:42 AM  Debra [name]  has presented today for surgery, with the diagnosis of Right lower extremity ischemia M62.89; Gangrene right great toe I96  The various methods of treatment have been discussed with the patient and family. After consideration of risks, benefits and other options for treatment, the patient has consented to  Procedure(s): AMPUTATION BELOW KNEE (Right) as a surgical intervention .  The patient's history has been reviewed, patient examined, no change in status, stable for surgery.  I have reviewed the patient's chart and labs.  Questions were answered to the patient's satisfaction.     Waverly Ferrariickson, Bryler Dibble

## 2015-09-06 NOTE — Telephone Encounter (Signed)
Sched appt 8/2 at 11:00. Ph# has no vm, mailed appt letter through regular mail to inform pt.

## 2015-09-06 NOTE — Evaluation (Signed)
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  Pt was seen in the ED on 05/24/15 with cold feet bilaterally w/ evidence of infrainguinal arterial occlusive disease.  According to her arteriogram there were no option for revascularization and no obvious source for embolization, pt w/ gangrenous Rt great toe, and pt's Rt BKA was completed on 09/06/15.  Pt's additional PMH includes back pain.    Clinical Impression  Pt admitted with above diagnosis. Pt currently with functional limitations due to the deficits listed below (see PT Problem List). Debra [name] will have excellent support from her family at d/c whom were assisting her w/ ADLs PTA.  She currently requires min guard assist for bed mobility and pt was educated on precautions and exercises.  Pt is very motivated to gain her independence and with intensive therapy pt has potential to reach mod I level of functioning.  Recommending CIR. Pt will benefit from skilled PT to increase their independence and safety with mobility to allow discharge to the venue listed below.      Follow Up Recommendations CIR;Supervision for mobility/OOB    Equipment Recommendations  Rolling walker with 5" wheels;3in1 (PT);Wheelchair (measurements PT);Wheelchair cushion (measurements PT)    Recommendations for Other Services OT consult;Rehab consult     Precautions / Restrictions Precautions Precautions: Fall;Other (comment) Precaution Comments: post op Rt BKA 09/06/15; Rt knee to remain in extension and no pillow under knee Restrictions Weight Bearing Restrictions: Yes RLE Weight Bearing: Non weight bearing      Mobility  Bed Mobility Overal bed mobility: Needs Assistance Bed Mobility: Supine to Sit;Sit to Supine     Supine to sit: Min guard;HOB elevated Sit to supine: Min guard   General bed mobility comments: Increased  time and use of bed rail.  Transfers                 General transfer comment: deferred as order is for OOB to chair POD #1  Ambulation/Gait                Stairs            Wheelchair Mobility    Modified Rankin (Stroke Patients Only)       Balance Overall balance assessment: Needs assistance Sitting-balance support: No upper extremity supported;Feet supported Sitting balance-Leahy Scale: Good Sitting balance - Comments: Pt sat EOB ~10 minutes with min guard assist for safety                                     Pertinent Vitals/Pain Pain Assessment: 0-10 Pain Score: 5  Pain Location: Rt LE Pain Descriptors / Indicators: Dull Pain Intervention(s): Limited activity within patient's tolerance;Monitored during session;Repositioned    Home Living Family/patient expects to be discharged to:: Private residence Living Arrangements: Parent;Children Available Help at Discharge: Family;Available 24 hours/day (will be moving back in with parents at d/c) Type of Home: House Home Access: Ramped entrance     Home Layout: One level   Additional Comments: Father has h/o Rt BKA as well.    Prior Function Level of Independence: Needs assistance   Gait / Transfers Assistance Needed: Was using her dad's cane and RW ambulating shorter distances.  Needed assist w/ shower transfer (daughter assisted).    ADL's / Homemaking Assistance Needed:  Needed assist from daughter w/ lower body dressing.  Mother and daughter was doing the cooking and cleaning as pt could not stand for longer periods of time.  Comments: Needed assist w/ organizing her medicine at times as her vision occassionally becomes blurry which she attribuates to her diabetes.     Hand Dominance   Dominant Hand: Right    Extremity/Trunk Assessment   Upper Extremity Assessment: Overall WFL for tasks assessed           Lower Extremity Assessment: RLE deficits/detail RLE Deficits /  Details: s/p Rt BKA w/ ace bandage    Cervical / Trunk Assessment: Normal  Communication   Communication: No difficulties  Cognition Arousal/Alertness: Awake/alert Behavior During Therapy: WFL for tasks assessed/performed Overall Cognitive Status: Within Functional Limits for tasks assessed                      General Comments General comments (skin integrity, edema, etc.): Pt reports feeling the need to wiggle and stretch out her Rt toes    Exercises Total Joint Exercises Long Arc Quad: AROM;Right;10 reps;Seated Amputee Exercises Quad Sets: Strengthening;Right;10 reps;Supine;Other (comment) (with 5 sec holds) Other Exercises Other Exercises: Pt demonstrated ~1 minute light massage to distal Rt LE.  Encouraged pt to do this every hour for 1 minute.      Assessment/Plan    PT Assessment Patient needs continued PT services  PT Diagnosis Difficulty walking;Acute pain   PT Problem List Decreased activity tolerance;Decreased balance;Decreased mobility;Decreased knowledge of use of DME;Decreased safety awareness;Decreased knowledge of precautions;Pain  PT Treatment Interventions DME instruction;Gait training;Functional mobility training;Therapeutic activities;Therapeutic exercise;Balance training;Patient/family education;Wheelchair mobility training;Modalities;Neuromuscular re-education   PT Goals (Current goals can be found in the Care Plan section) Acute Rehab PT Goals Patient Stated Goal: to get stronger and regain her independence PT Goal Formulation: With patient Time For Goal Achievement: 09/20/15 Potential to Achieve Goals: Good    Frequency Min 4X/week   Barriers to discharge        Co-evaluation               End of Session   Activity Tolerance: Patient tolerated treatment well Patient left: in bed;with call bell/phone within reach;with bed alarm set;with SCD's reapplied;with family/visitor present Nurse Communication: Mobility status          Time: 1538-1600 PT Time Calculation (min) (ACUTE ONLY): 22 min   Charges:   PT Evaluation $PT Eval Low Complexity: 1 Procedure     PT G Codes:        Debra [name] PT, DPT  Pager: [phone] Phone: [phone] 09/06/2015, 4:32 PM

## 2015-09-06 NOTE — Consult Note (Signed)
Physical Medicine and Rehabilitation Consult   Reason for Consult: R-BKA due to gangrenous changes  Referring Physician: Dr. Edilia Boickson.    HPI: Debra [name] is a 40 y.o. female with history of T2DM, HTN, ongoing tobacco use, coolness bilateral feet with evidence of PAD who developed progressive gangrenous changes right foot with rest pain and ischemia. She failed conservative treatment.  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 Edilia Boickson. Post op therapy evaluations to be done today. She has associated phantom limb pain.  CIR recommended by MD in anticipation of rehab needs.    Review of Systems  HENT: Negative for hearing loss.   Eyes: Negative for blurred vision and double vision.  Respiratory: Negative for cough and shortness of breath.   Cardiovascular: Negative for chest pain and palpitations.  Gastrointestinal: Positive for heartburn. Negative for abdominal pain and constipation.  Genitourinary: Negative for dysuria.  Musculoskeletal: Positive for myalgias, joint pain (right hip problems due to trauma) and falls (gets dizzy/lightheaded if she bends over).  Neurological: Positive for dizziness and weakness. Negative for headaches.       +Phantom limb pain  Psychiatric/Behavioral: Negative for depression and memory loss. The patient is not nervous/anxious.   All other systems reviewed and are negative.     Past Medical History  Diagnosis Date  . Diabetes mellitus without complication (HCC)   . Back pain   . Abscess   . Hyperlipidemia   . Hypertension   . Peripheral vascular disease (HCC)   . Bronchitis   . Pneumonia   . Goiter   . Anxiety   . Depression   . GERD (gastroesophageal reflux disease)   . Headache     migraines    Past Surgical History  Procedure Laterality Date  . Peripheral vascular catheterization N/A 05/28/2015    Procedure: Abdominal Aortogram;  Surgeon: Chuck Hinthristopher S Dickson, MD;  [facility];  Service: Cardiovascular;   Laterality: N/A;  . 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 Diabetes Mother   . Hypertension Mother   . Hyperlipidemia Mother   . Diabetes Father   . Heart disease Father     Social History:  Lives alone--plans on moving in with parents who have ramp at entry and mother in good health. Mother and daughter can assist after discharge? she reports that she has been smoking one pack /2-3 days.  Her smoking use included Cigarettes. She has a 5 pack-year smoking history. She has never used smokeless tobacco. She reports that she does not drink alcohol or use illicit drugs.    Allergies  Allergen Reactions  . Ibuprofen Other (See Comments)    Upset stomach    Medications Prior to Admission  Medication Sig Dispense Refill  . aspirin 325 MG tablet Take 1 tablet (325 mg total) by mouth daily. 30 tablet 1  . atorvastatin (LIPITOR) 20 MG tablet Take 20 mg by mouth daily.    . cephALEXin (KEFLEX) 500 MG capsule Take 1 capsule (500 mg total) by mouth 3 (three) times daily. 42 capsule 2  . cholecalciferol (VITAMIN D) 1000 units tablet Take 1,000 Units by mouth daily.    Marland Kitchen. glimepiride (AMARYL) 2 MG tablet Take 2 mg by mouth daily with breakfast.    . glipiZIDE (GLUCOTROL) 10 MG tablet Take 1 tablet (10 mg total) by mouth 2 (two) times daily before a meal. (Patient taking differently: Take 5  mg by mouth 2 (two) times daily before a meal. ) 60 tablet 3  . lisinopril (PRINIVIL,ZESTRIL) 5 MG tablet Take 1 tablet (5 mg total) by mouth daily. (Patient taking differently: Take 10 mg by mouth daily. ) 30 tablet 3  . metFORMIN (GLUCOPHAGE) 500 MG tablet Take 500 mg by mouth 2 (two) times daily with a meal.     . nitroGLYCERIN (NITRODUR - DOSED IN MG/24 HR) 0.4 mg/hr patch Apply to right foot near big toe daily 30 patch 0  . oxyCODONE (OXY IR/ROXICODONE) 5 MG immediate release tablet Take 1 tablet (5 mg total) by mouth every 6 (six) hours as needed for  severe pain. 30 tablet 0  . vitamin C (ASCORBIC ACID) 250 MG tablet Take 250 mg by mouth daily.    . cephALEXin (KEFLEX) 500 MG capsule Take 1 capsule (500 mg total) by mouth 3 (three) times daily. (Patient not taking: Reported on 08/30/2015) 42 capsule 1    Home: Home Living Family/patient expects to be discharged to:: Unsure Living Arrangements: Parent Available Help at Discharge: Family, Available 24 hours/day (will be moving back in with parents at d/c) Type of Home: House Home Access: Ramped entrance Home Layout: One level Bathroom Shower/Tub: Tub/shower unit, Engineer, building services: Standard Additional Comments: Father has h/o Rt BKA as well.  Functional History: Prior Function Level of Independence: Needs assistance Gait / Transfers Assistance Needed: Was using her dad's cane and RW ambulating shorter distances.  Needed assist w/ shower transfer (daughter assisted).   ADL's / Homemaking Assistance Needed: Needed assist from daughter w/ lower body dressing.  Mother and daughter was doing the cooking and cleaning as pt could not stand for longer periods of time. Comments: Needed assist w/ organizing her medicine at times as her vision occassionally becomes blurry which she attribuates to her diabetes. Functional Status:  Mobility: Bed Mobility Overal bed mobility: Needs Assistance Bed Mobility: Supine to Sit, Sit to Supine Supine to sit: Min guard, HOB elevated Sit to supine: Min guard General bed mobility comments: Increased time and use of bed rail. Transfers General transfer comment: deferred as order is for OOB to chair POD #1      ADL:    Cognition: Cognition Overall Cognitive Status: Within Functional Limits for tasks assessed Orientation Level: Oriented X4 Cognition Arousal/Alertness: Awake/alert Behavior During Therapy: WFL for tasks assessed/performed Overall Cognitive Status: Within Functional Limits for tasks assessed   Blood pressure 141/85, pulse 113,  temperature 98.7 F (37.1 C), temperature source Oral, resp. rate 20, height 5\' 10"  (1.778 m), weight 116.121 kg (256 lb), last menstrual period 08/22/2015, SpO2 96 %. Physical Exam  Nursing note and vitals reviewed. Constitutional: She is oriented to person, place, and time. She appears well-developed and well-nourished. She is easily aroused. Nasal cannula in place.  HENT:  Head: Normocephalic and atraumatic.  Mouth/Throat: Oropharynx is clear and moist.  Eyes: Conjunctivae and EOM are normal. Pupils are equal, round, and reactive to light. Right eye exhibits no discharge.  Neck: Normal range of motion. Neck supple.  Cardiovascular: Regular rhythm.   Tachycardic  Respiratory: Effort normal and breath sounds normal. No stridor. No respiratory distress. She has no wheezes.  GI: Soft. Bowel sounds are normal. She exhibits no distension. There is no tenderness.  Musculoskeletal: She exhibits edema and tenderness.  R-BKA with dry dressing.   Neurological: She is alert, oriented to person, place, and time and easily aroused.  Able to follow simple motor commands without difficulty.  Motor: B/l  UE, LLE: 5/5 proximal to distal RLE: 3/5 hip flexion (pain inhibition)  Skin: Skin is warm and dry. No rash noted. No erythema.  Psychiatric: She has a normal mood and affect. She is slowed. Cognition and memory are normal.    Results for orders placed or performed during the hospital encounter of 09/06/15 (from the past 24 hour(s))  Glucose, capillary     Status: Abnormal   Collection Time: 09/06/15 11:18 AM  Result Value Ref Range   Glucose-Capillary 217 (H) 65 - 99 mg/dL  CBC     Status: Abnormal   Collection Time: 09/06/15 12:35 PM  Result Value Ref Range   WBC 6.4 4.0 - 10.5 K/uL   RBC 4.16 3.87 - 5.11 MIL/uL   Hemoglobin 12.8 12.0 - 15.0 g/dL   HCT 11.9 14.7 - 82.9 %   MCV 90.6 78.0 - 100.0 fL   MCH 30.8 26.0 - 34.0 pg   MCHC 34.0 30.0 - 36.0 g/dL   RDW 56.2 13.0 - 86.5 %   Platelets  439 (H) 150 - 400 K/uL  Creatinine, serum     Status: None   Collection Time: 09/06/15 12:35 PM  Result Value Ref Range   Creatinine, Ser 0.56 0.44 - 1.00 mg/dL   GFR calc non Af Amer >60 >60 mL/min   GFR calc Af Amer >60 >60 mL/min  Glucose, capillary     Status: Abnormal   Collection Time: 09/06/15  1:40 PM  Result Value Ref Range   Glucose-Capillary 119 (H) 65 - 99 mg/dL  Glucose, capillary     Status: Abnormal   Collection Time: 09/06/15  4:27 PM  Result Value Ref Range   Glucose-Capillary 278 (H) 65 - 99 mg/dL   Comment 1 Notify RN    Comment 2 Document in Chart   Glucose, capillary     Status: Abnormal   Collection Time: 09/06/15  8:50 PM  Result Value Ref Range   Glucose-Capillary 153 (H) 65 - 99 mg/dL  Basic metabolic panel     Status: Abnormal   Collection Time: 09/07/15  2:34 AM  Result Value Ref Range   Sodium 134 (L) 135 - 145 mmol/L   Potassium 3.8 3.5 - 5.1 mmol/L   Chloride 101 101 - 111 mmol/L   CO2 24 22 - 32 mmol/L   Glucose, Bld 227 (H) 65 - 99 mg/dL   BUN 6 6 - 20 mg/dL   Creatinine, Ser 7.84 0.44 - 1.00 mg/dL   Calcium 8.6 (L) 8.9 - 10.3 mg/dL   GFR calc non Af Amer >60 >60 mL/min   GFR calc Af Amer >60 >60 mL/min   Anion gap 9 5 - 15  CBC     Status: Abnormal   Collection Time: 09/07/15  2:34 AM  Result Value Ref Range   WBC 13.0 (H) 4.0 - 10.5 K/uL   RBC 4.15 3.87 - 5.11 MIL/uL   Hemoglobin 12.9 12.0 - 15.0 g/dL   HCT 69.6 29.5 - 28.4 %   MCV 90.6 78.0 - 100.0 fL   MCH 31.1 26.0 - 34.0 pg   MCHC 34.3 30.0 - 36.0 g/dL   RDW 13.2 44.0 - 10.2 %   Platelets 485 (H) 150 - 400 K/uL  Glucose, capillary     Status: Abnormal   Collection Time: 09/07/15  6:36 AM  Result Value Ref Range   Glucose-Capillary 263 (H) 65 - 99 mg/dL   No results found.  Assessment/Plan: Diagnosis: R-BKA due to gangrenous changes  Labs and images independently reviewed.  Records reviewed and summated above. Wheelchair training Clean amputation daily with soap and  water Monitor incision site for signs of infection or impending skin breakdown. Staples to remain in place for 3-4 weeks Stump shrinker, for edema control  Scar mobilization massaging to prevent soft tissue adherence Stump protector during therapies Prevent flexion contractures by implementing the following:   Encourage prone lying for 20-30 mins per day BID to avoid hip flexion  Contractures if medically appropriate;  Avoid pillow under knees when patient is lying in bed in order to prevent both  knee and hip flexion contractures;  Avoid prolonged sitting Post surgical pain control with oral medication Phantom limb pain control with physical modalities including desensitization techniques (gentle self massage to the residual stump,hot packs if sensation iintact, US) and mirror therapy, TENS. If ineffective, consider pharmacological treatment for neuropathic pain (e.g gabapentin, pregabalin, amytriptalyine, duloxetine).  When using wheelchair, patient should have knee on amputated side fully extended with board under the seat cushion. Avoid injury to contralateral side  1. Does the need for close, 24 hr/day medical supervision in concert with the patient's rehab needs make it unreasonable for this patient to be served in a less intensive setting? Yes  2. Co-Morbidities requiring supervision/potential complications: T2DM (Monitor in accordance with exercise and adjust meds as necessary), HTN (monitor and provide prns in accordance with increased physical exertion and pain), ongoing tobacco use (counsel), PAD (cont meds), Tachycardia (monitor in accordance with pain and increasing activity), leukocytosis (cont to monitor for signs and symptoms of infection, further workup if indicated), hyponatremia (cont to monitor, treat if necessary), hyperkalemia (cont to monitor, treat as necessary), post-op pain (Biofeedback training with therapies to help reduce reliance on opiate pain medications, monitor pain  control during therapies, and sedation at rest and titrate to maximum efficacy to ensure participation and gains in therapies), obesity (Body mass index is 36.73 kg/(m^2)., diet and exercise education, encourage weight loss to increase endurance and promote overall health) 3. Due to bladder management, safety, skin/wound care, disease management, medication administration, pain management and patient education, does the patient require 24 hr/day rehab nursing? Yes 4. Does the patient require coordinated care of a physician, rehab nurse, PT (1-2 hrs/day, 5 days/week) and OT (1-2 hrs/day, 5 days/week) to address physical and functional deficits in the context of the above medical diagnosis(es)? Yes Addressing deficits in the following areas: balance, endurance, locomotion, strength, transferring, bathing, dressing, toileting and psychosocial support 5. Can the patient actively participate in an intensive therapy program of at least 3 hrs of therapy per day at least 5 days per week? Potentially 6. The potential for patient to make measurable gains while on inpatient rehab is excellent 7. Anticipated functional outcomes upon discharge from inpatient rehab are TBD  with PT, TBD with OT, n/a with SLP. 8. Estimated rehab length of stay to reach the above functional goals is: TBD, likely 8-12 days. 9. Does the patient have adequate social supports and living environment to accommodate these discharge functional goals? Yes 10. Anticipated D/C setting: Home 11. Anticipated post D/C treatments: HH therapy and Home excercise program 12. Overall Rehab/Functional Prognosis: good  RECOMMENDATIONS: This patient's condition is appropriate for continued rehabilitative care in the following setting: CIR once medically stable and able to tolerate 3 hours therapy/day. Patient has agreed to participate in recommended program. Yes Note that insurance prior authorization may be required for reimbursement for recommended  care.  Comment: Rehab Admissions Coordinator to follow  up.  Maryla Morrow, MD 09/07/2015

## 2015-09-06 NOTE — Progress Notes (Signed)
Rehab Admissions Coordinator Note:  Patient was screened by Trish MageLogue, Alylah Blakney M for appropriateness for an Inpatient Acute Rehab Consult.  At this time, an inpatient rehab consult has been ordered and is pending.  Fae PippinMelissa Bowie will follow up once consult is completed.  Lelon FrohlichLogue, Mehar Sagen M 09/06/2015, 5:22 PM  I can be reached at [phone].

## 2015-09-06 NOTE — Op Note (Signed)
    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 Edilia Boickson, MD, FACS  ASSIST: Doreatha MassedSamantha Rhyne, PA  ANESTHESIA: gen.   EBL: minimal  INDICATIONS: Debra AgarStephanie Westrich is a 40 y.o. female presented with gangrene of her right great toe. She underwent an arteriogram which showed severe distal disease and no options for revascularization. Ultimately the wound on her foot progressed  Extending further back on the dorsum of the foot with cellulitis, and primary below the knee amputation was recommended.  FINDINGS: the muscle was well perfused.  TECHNIQUE: the patient was taken to the operating room and received a general anesthetic. The right lower extremity was prepped and draped in the usual sterile fashion. The circumference of the limb was measured 10 senna meters distal to the tibial tuberosity and two thirds of this distance was used to mark the anterior skin flap. A long posterior flap of equal length was marked. A tourniquet had been placed on the upper thigh. The leg was exsanguinated with an Esmarch bandage and tourniquet inflated to 300 mmHg. Under tourniquet control, the incision was carried down to the skin, subcutaneous tissue, fascia, and muscle to the tibia and fibula which were dissected free circumferentially. The bone was divided proximal to the level of skin division. The anterior aspect of the tibia was beveled. The limb was removed. The arteries and veins were individually suture ligated with 2-0 lk ties. The tourniquet was then released.Additional hemostasis was obtained using electrocautery and 2-0 silk ties and sutures. The edges of the bone were rasped. The wound was irrigated with copious amounts of saline. The fascial layer was closed with interrupted 2-0 Vicryl  sutures. The skin was closed with staples. Sterile dressing was applied. The patient tolerated the procedure well and was transferred to the recovery room in stable condition. All needle and sponge counts were correct.  Waverly Ferrarihristopher Dickson, MD, FACS [facility]  DATE OF DICTATION:   09/06/2015

## 2015-09-07 ENCOUNTER — Encounter (HOSPITAL_COMMUNITY): Payer: Self-pay | Admitting: General Practice

## 2015-09-07 DIAGNOSIS — E119 Type 2 diabetes mellitus without complications: Secondary | ICD-10-CM

## 2015-09-07 DIAGNOSIS — I998 Other disorder of circulatory system: Secondary | ICD-10-CM

## 2015-09-07 DIAGNOSIS — R000 Tachycardia, unspecified: Secondary | ICD-10-CM

## 2015-09-07 DIAGNOSIS — E875 Hyperkalemia: Secondary | ICD-10-CM

## 2015-09-07 DIAGNOSIS — D72829 Elevated white blood cell count, unspecified: Secondary | ICD-10-CM

## 2015-09-07 DIAGNOSIS — E1169 Type 2 diabetes mellitus with other specified complication: Secondary | ICD-10-CM | POA: Insufficient documentation

## 2015-09-07 DIAGNOSIS — E871 Hypo-osmolality and hyponatremia: Secondary | ICD-10-CM

## 2015-09-07 DIAGNOSIS — G8918 Other acute postprocedural pain: Secondary | ICD-10-CM | POA: Insufficient documentation

## 2015-09-07 DIAGNOSIS — Z720 Tobacco use: Secondary | ICD-10-CM | POA: Insufficient documentation

## 2015-09-07 DIAGNOSIS — I739 Peripheral vascular disease, unspecified: Secondary | ICD-10-CM | POA: Insufficient documentation

## 2015-09-07 DIAGNOSIS — G546 Phantom limb syndrome with pain: Secondary | ICD-10-CM

## 2015-09-07 DIAGNOSIS — I10 Essential (primary) hypertension: Secondary | ICD-10-CM

## 2015-09-07 DIAGNOSIS — Z89511 Acquired absence of right leg below knee: Secondary | ICD-10-CM

## 2015-09-07 DIAGNOSIS — E669 Obesity, unspecified: Secondary | ICD-10-CM

## 2015-09-07 LAB — BASIC METABOLIC PANEL
Anion gap: 9 (ref 5–15)
BUN: 6 mg/dL (ref 6–20)
CHLORIDE: 101 mmol/L (ref 101–111)
CO2: 24 mmol/L (ref 22–32)
Calcium: 8.6 mg/dL — ABNORMAL LOW (ref 8.9–10.3)
Creatinine, Ser: 0.47 mg/dL (ref 0.44–1.00)
GFR calc Af Amer: >60 mL/min (ref >60)
GFR calc non Af Amer: >60 mL/min (ref >60)
GLUCOSE: 227 mg/dL — AB (ref 65–99)
POTASSIUM: 3.8 mmol/L (ref 3.5–5.1)
Sodium: 134 mmol/L — ABNORMAL LOW (ref 135–145)

## 2015-09-07 LAB — CBC
HCT: 37.6 % (ref 36.0–46.0)
Hemoglobin: 12.9 g/dL (ref 12.0–15.0)
MCH: 31.1 pg (ref 26.0–34.0)
MCHC: 34.3 g/dL (ref 30.0–36.0)
MCV: 90.6 fL (ref 78.0–100.0)
Platelets: 485 10*3/uL — ABNORMAL HIGH (ref 150–400)
RBC: 4.15 MIL/uL (ref 3.87–5.11)
RDW: 12 % (ref 11.5–15.5)
WBC: 13 10*3/uL — ABNORMAL HIGH (ref 4.0–10.5)

## 2015-09-07 LAB — GLUCOSE, CAPILLARY
GLUCOSE-CAPILLARY: 232 mg/dL — AB (ref 65–99)
GLUCOSE-CAPILLARY: 234 mg/dL — AB (ref 65–99)
GLUCOSE-CAPILLARY: 202 mg/dL — AB (ref 65–99)
Glucose-Capillary: 263 mg/dL — ABNORMAL HIGH (ref 65–99)

## 2015-09-07 LAB — HEMOGLOBIN A1C
Hgb A1c MFr Bld: 12.1 % — ABNORMAL HIGH (ref 4.8–5.6)
MEAN PLASMA GLUCOSE: 301 mg/dL

## 2015-09-07 MED ORDER — INSULIN STARTER KIT- PEN NEEDLES (ENGLISH)
1.0000 | Freq: Once | Status: AC
  Administered 2015-09-07: 1
  Filled 2015-09-07: qty 1

## 2015-09-07 MED ORDER — LIVING WELL WITH DIABETES BOOK
Freq: Once | Status: AC
  Administered 2015-09-07: 1
  Filled 2015-09-07: qty 1

## 2015-09-07 MED ORDER — INSULIN GLARGINE 100 UNIT/ML ~~LOC~~ SOLN
25.0000 [IU] | Freq: Every day | SUBCUTANEOUS | Status: DC
Start: 2015-09-07 — End: 2015-09-10
  Administered 2015-09-07 – 2015-09-10 (×4): 25 [IU] via SUBCUTANEOUS
  Filled 2015-09-07 (×4): qty 0.25

## 2015-09-07 MED ORDER — INSULIN ASPART 100 UNIT/ML ~~LOC~~ SOLN
4.0000 [IU] | Freq: Three times a day (TID) | SUBCUTANEOUS | Status: DC
  Administered 2015-09-07 – 2015-09-10 (×11): 4 [IU] via SUBCUTANEOUS

## 2015-09-07 NOTE — Care Management Note (Addendum)
Case Management Note  Patient Details  Name: Debra [name] MRN: [id number] Date of Birth: 10/03/1975  Subjective/Objective:     [name]/p BKA               Action/Plan:  PTA pt from home with family support.  CIR has been recommended - CSW consulted for SNF back up plan.  CM left physician sticky note and CM handoff stating that pt may need DME, HHRN and MATCH at discharge if going home.  CM provided [facility] information in case discharged over weekend. CM will continue to follow for discharge needs   Expected Discharge Date:                  Expected Discharge Plan:  IP Rehab Facility  In-House Referral:  Clinical Social Work  Discharge planning Services  CM Consult  Post Acute Care Choice:    Choice offered to:     DME Arranged:    DME Agency:     HH Arranged:    HH Agency:     Status of Service:  In process, will continue to follow  If discussed at Long Length of Stay Meetings, dates discussed:    Additional Comments:  Debra [name], Debra Rekowski S, RN 09/07/2015, 9:05 AM

## 2015-09-07 NOTE — Progress Notes (Signed)
   VASCULAR SURGERY ASSESSMENT & PLAN:  1 Day Post-Op s/p: Right BKA  On PCA for pain now.  Dressing change tomorrow  CIR consulted  SUBJECTIVE: Rough night but better now with PCA  PHYSICAL EXAM: Filed Vitals:   09/06/15 1927 09/06/15 2312 09/07/15 0358 09/07/15 0439  BP: 132/74   141/85  Pulse: 80   113  Temp: 98.3 F (36.8 C)   98.7 F (37.1 C)  TempSrc: Oral   Oral  Resp: 20 18 18 20   Height:      Weight:      SpO2: 100% 98% 96% 99%   No drainage on dressing.   LABS: Lab Results  Component Value Date   WBC 13.0* 09/07/2015   HGB 12.9 09/07/2015   HCT 37.6 09/07/2015   MCV 90.6 09/07/2015   PLT 485* 09/07/2015   Lab Results  Component Value Date   CREATININE 0.47 09/07/2015   CBG (last 3)   Recent Labs  09/06/15 1627 09/06/15 2050 09/07/15 0636  GLUCAP 278* 153* 263*    Active Problems:   Ischemia of extremity   Cari CarawayChris Iqra Rotundo Beeper: [phone] 09/07/2015

## 2015-09-07 NOTE — Progress Notes (Signed)
Physical Therapy Treatment Patient Details Name: Debra AgarStephanie [name] MRN: [id number] DOB: 08/18/1975 Today's Date: 09/07/2015    History of Present Illness Pt is a 40 y/o F s/p Rt BKA.  Pt was seen in the ED on 05/24/15 with cold feet bilaterally w/ evidence of infrainguinal arterial occlusive disease.  According to her arteriogram there were no option for revascularization and no obvious source for embolization, pt w/ gangrenous Rt great toe, and pt's Rt BKA was completed on 09/06/15.  Pt's additional PMH includes back pain.    PT Comments    Pt is progressing toward mobility goals. Pt is min A for overall mobility and tolerated OOB transfers well. Pt is very motivated to regain independence and to participate in therapy. Continue to recommend CIR for further skilled PT services to maximize independence and safety with mobility.   Follow Up Recommendations  CIR;Supervision for mobility/OOB     Equipment Recommendations  Rolling walker with 5" wheels;3in1 (PT);Wheelchair (measurements PT);Wheelchair cushion (measurements PT)    Recommendations for Other Services OT consult;Rehab consult     Precautions / Restrictions Precautions Precautions: Fall;Other (comment) Precaution Comments: post op Rt BKA 09/06/15; Rt knee to remain in extension and no pillow under knee Restrictions Weight Bearing Restrictions: Yes RLE Weight Bearing: Non weight bearing    Mobility  Bed Mobility Overal bed mobility: Needs Assistance Bed Mobility: Supine to Sit;Sit to Supine     Supine to sit: Min assist;HOB elevated     General bed mobility comments: assist to bring hips to EOB with use of bed pad and for trunk support; cues for sequencing and technique  Transfers Overall transfer level: Needs assistance Equipment used: Rolling walker (2 wheeled) Transfers: Sit to/from [employer] CorporationStand;Stand Pivot Transfers Sit to Stand: Min assist;+2 safety/equipment Stand pivot transfers: Min assist;+2 safety/equipment        General transfer comment: assist for gaining balance and finding COG upon stand and for hopping on L foot with cues for hand placement, sequencing, and technique; assist for line management  Ambulation/Gait                 Stairs            Wheelchair Mobility    Modified Rankin (Stroke Patients Only)       Balance Overall balance assessment: Needs assistance Sitting-balance support: Feet supported;No upper extremity supported Sitting balance-Leahy Scale: Good     Standing balance support: Bilateral upper extremity supported Standing balance-Leahy Scale: Poor Standing balance comment: Reliant on UE support for balance                    Cognition Arousal/Alertness: Awake/alert Behavior During Therapy: WFL for tasks assessed/performed Overall Cognitive Status: Within Functional Limits for tasks assessed                      Exercises Total Joint Exercises Long Arc Quad: AROM;Right;5 reps;Seated    General Comments General comments (skin integrity, edema, etc.): educated pt on importance of positioning and encouraged to keep massaging R residual limb       Pertinent Vitals/Pain Pain Assessment: 0-10 Pain Score: 10-Worst pain ever Pain Location: R residual limb Pain Descriptors / Indicators: Throbbing;Tightness Pain Intervention(s): Limited activity within patient's tolerance;Monitored during session;Premedicated before session;Repositioned    Home Living Family/patient expects to be discharged to:: Private residence Living Arrangements: Parent;Children Available Help at Discharge: Family;Available 24 hours/day Type of Home: House Home Access: Ramped entrance   Home Layout: One level  Additional Comments: Pt's father has hx of BKA as well and has equipment, but needs it for himself.    Prior Function Level of Independence: Needs assistance  Gait / Transfers Assistance Needed: Was using her dad's cane and RW ambulating shorter  distances.  Needed assist w/ shower transfer (daughter assisted).   ADL's / Homemaking Assistance Needed: Needed assist from daughter w/ lower body dressing.  Mother and daughter was doing the cooking and cleaning as pt could not stand for longer periods of time. Comments: Needed assist w/ organizing her medicine at times as her vision occassionally becomes blurry which she attribuates to her diabetes.   PT Goals (current goals can now be found in the care plan section) Acute Rehab PT Goals Patient Stated Goal: to get stronger and regain her independence Progress towards PT goals: Progressing toward goals    Frequency  Min 4X/week    PT Plan Current plan remains appropriate    Co-evaluation PT/OT/SLP Co-Evaluation/Treatment: Yes Reason for Co-Treatment: For patient/therapist safety PT goals addressed during session: Mobility/safety with mobility;Balance;Proper use of DME OT goals addressed during session: ADL's and self-care;Proper use of Adaptive equipment and DME;Strengthening/ROM     End of Session Equipment Utilized During Treatment: Gait belt Activity Tolerance: Patient tolerated treatment well Patient left: in chair;with call bell/phone within reach;with family/visitor present;with chair alarm set;with nursing/sitter in room     Time: 1124-1200 PT Time Calculation (min) (ACUTE ONLY): 36 min  Charges:  $Therapeutic Activity: 8-22 mins                    G Codes:      Derek MoundKellyn R Brystol Wasilewski Rania Prothero, PTA Pager: [phone]) [phone]   09/07/2015, 2:02 PM

## 2015-09-07 NOTE — Clinical Social Work Note (Signed)
Clinical Social Work Assessment  Patient Details  Name: Debra [name] MRN: [id number] Date of Birth: 1975-08-16  Date of referral:  09/07/15               Reason for consult:  Discharge Planning                Permission sought to share information with:  Family Supports Permission granted to share information::  Yes, Verbal Permission Granted  Name::     Debra [name]  Agency::     Relationship::  Brother  Contact Information:  [phone]  Housing/Transportation Living arrangements for the past 2 months:  [place] of Information:  Patient, Medical Team Patient Interpreter Needed:  None Criminal Activity/Legal Involvement Pertinent to Current Situation/Hospitalization:  No - Comment as needed Significant Relationships:  Siblings Lives with:  Minor Children, Siblings Do you feel safe going back to the place where you live?  Yes Need for family participation in patient care:  Yes (Comment)  Care giving concerns:  PT recommending CIR. CSW met with patient to also discuss SNF option.   Social Worker assessment / plan:  CSW met with patient. Daughter at bedside. CSW introduced role and explained that discharge planning would be discussed. PT recommending CIR but discussed SNF options in case CIR did not work out. Chart shows that patient does not have Medicaid but patient showed Medicaid card. Discussed barriers to SNF with Medicaid: Private pay therapy, 30 day minimum, and no check. Patient only gets food stamps. Patient and her daughter both stated that they would not be able to private pay for therapy. CSW provided SNF list in the event we did go that route. No further concerns. CSW encouraged patient to contact CSW as needed. CSW will continue to follow patient for support and facilitate discharge to SNF if needed once medically stable.  Employment status:  Unemployed Forensic scientist:  Medicaid In [place] PT Recommendations:  Inpatient Rehab Consult Information /  Referral to community resources:  [place]  Patient/Family's Response to care:  Patient stated that she could not pay privately for therapy at [facility]. Patient's family supportive and involved in patient's care. Patient and her daughter appreciated social work intervention.  Patient/Family's Understanding of and Emotional Response to Diagnosis, Current Treatment, and Prognosis: Patient understands need for rehab prior to returning home. Patient appears happy with care at hospital.  Emotional Assessment Appearance:  Appears stated age Attitude/Demeanor/Rapport:  Lethargic Affect (typically observed):  Accepting, Appropriate, Calm, Pleasant Orientation:  Oriented to Place, Oriented to Self, Oriented to  Time, Oriented to Situation Alcohol / Substance use:  Tobacco Use Psych involvement (Current and /or in the community):  No (Comment)  Discharge Needs  Concerns to be addressed:  Care Coordination Readmission within the last 30 days:  No Current discharge risk:  Dependent with Mobility, Physical Impairment Barriers to Discharge:  Inadequate or no insurance   Candie Chroman, LCSW 09/07/2015, 3:57 PM

## 2015-09-07 NOTE — Progress Notes (Addendum)
Inpatient Rehabilitation  Met with patient and daughter to discuss team's recommendation for IP Rehab.  Patient sleepy due to pain medication but reported being eager to regain her independence.  Shared booklets, discussed program, and answered questions.  Patient provided me with her [place] Medicaid policy number.  Plan to follow up Monday in hopes of medical readiness, pain management with oral medications, and increased therapy participation/tolerance.  Plan for my co-worker Gerlean Ren to follow up Monday.    Update: Verification of Medicaid revealed that eligibility is for family planning services only.  Notified patient, who states that she should have full medical coverage.  Plan to investigate further on Monday.   Carmelia Roller., CCC/SLP Admission Coordinator  [place]  Cell [phone]

## 2015-09-07 NOTE — Progress Notes (Signed)
Inpatient Diabetes Program Recommendations  AACE/ADA: New Consensus Statement on Inpatient Glycemic Control (2015)  Target Ranges:  Prepandial:   less than 140 mg/dL      Peak postprandial:   less than 180 mg/dL (1-2 hours)      Critically ill patients:  140 - 180 mg/dL   Lab Results  Component Value Date   GLUCAP 232* 09/07/2015   HGBA1C 12.1* 09/06/2015   Spoke with patient regarding elevated A1C.  She states has never been on insulin in the past but only pills.  Gave patient handout regarding diabetes standards of care and goal A1C.  Discussed probable need for insulin at discharge and need to follow-up with PCP.  She states she has new MD off [employer].  Will order insulin starter kit, diabetes videos and education by bedside RN.  Patient states she does not have glucose meter and will need Rx. For meter at d/c.  She states she is familiar with insulin administration b/c both her Mom and Dad have diabetes and are on insulin.  Encouraged patient to practice administering insulin while in the hospital when insulin is due.  Will follow.  Thanks, Adah Perl, RN, BC-ADM Inpatient Diabetes Coordinator Pager [phone] (8a-5p)

## 2015-09-07 NOTE — Progress Notes (Signed)
Inpatient Diabetes Program Recommendations  AACE/ADA: New Consensus Statement on Inpatient Glycemic Control (2015)  Target Ranges:  Prepandial:   less than 140 mg/dL      Peak postprandial:   less than 180 mg/dL (1-2 hours)      Critically ill patients:  140 - 180 mg/dL   Lab Results  Component Value Date   GLUCAP 263* 09/07/2015   HGBA1C 12.1* 09/06/2015    Review of Glycemic ControlResults for [name], Debra (MRN [id number]) as of 09/07/2015 09:52  Ref. Range 09/06/2015 07:46 09/06/2015 08:39 09/06/2015 09:33 09/06/2015 11:18 09/06/2015 13:40 09/06/2015 16:27 09/06/2015 20:50 09/07/2015 06:36  Glucose-Capillary Latest Ref Range: 65-99 mg/dL 409335 (H) 811330 (H) 914260 (H) 217 (H) 119 (H) 278 (H) 153 (H) 263 (H)   Diabetes history: Type 2 diabetes Outpatient Diabetes medications: Amaryl 2 mg daily, Metformin 500 mg bid, Glucotrol 5 mg bid Current orders for Inpatient glycemic control:  Novolog moderate tid with meals, Glipizide 5 mg bid, Metformin 500 mg bid  Inpatient Diabetes Program Recommendations:   Please consider adding Lantus 25 units daily while patient is in the hospital and Novolog meal coverage 4 units tid with meals. Called and discussed with Dr. Edilia Boickson and orders received.   Thanks, Beryl MeagerJenny Marvell Stavola, RN, BC-ADM Inpatient Diabetes Coordinator Pager [phone] (8a-5p)

## 2015-09-07 NOTE — Progress Notes (Signed)
Occupational Therapy Evaluation Patient Details Name: Debra AgarStephanie [name] MRN: [id number] DOB: 03/06/1975 Today's Date: 09/07/2015    History of Present Illness Pt is a 40 y/o F s/p Rt BKA.  Pt was seen in the ED on 05/24/15 with cold feet bilaterally w/ evidence of infrainguinal arterial occlusive disease.  According to her arteriogram there were no option for revascularization and no obvious source for embolization, pt w/ gangrenous Rt great toe, and pt's Rt BKA was completed on 09/06/15.  Pt's additional PMH includes back pain.   Clinical Impression   PTA, pt required assist with LB ADLs and used RW for mobility. Pt currently presents with R residual limb pain and resulting balance impairments and requires min assist for basic transfers and mod assist for LB ADLs. Pt is very motivated to regain independence since she has 2 teenaged daughters and also has a very supportive family. Pt is an excellent candidate for CIR as she is already making great progress and has great potential to return to mod I level of functioning. Will continue to follow acutely to address OT needs and goals.    Follow Up Recommendations  CIR;Supervision/Assistance - 24 hour    Equipment Recommendations  3 in 1 bedside comode;Tub/shower bench;Wheelchair cushion (measurements OT);Wheelchair (measurements OT)    Recommendations for Other Services       Precautions / Restrictions Precautions Precautions: Fall;Other (comment) Precaution Comments: post op Rt BKA 09/06/15; Rt knee to remain in extension and no pillow under knee Restrictions Weight Bearing Restrictions: Yes RLE Weight Bearing: Non weight bearing      Mobility Bed Mobility Overal bed mobility: Needs Assistance Bed Mobility: Supine to Sit;Sit to Supine     Supine to sit: Min assist;HOB elevated     General bed mobility comments: Assist for trunk support mainly due to pain. VCs for hand placement with HOB elevated  Transfers Overall transfer level:  Needs assistance Equipment used: Rolling walker (2 wheeled) Transfers: Sit to/from [employer]Stand;Stand Pivot Transfers Sit to Stand: Min assist;+2 safety/equipment Stand pivot transfers: Min assist;+2 safety/equipment       General transfer comment: +2 assist primarily for line management, pt able to stand with min assist and VCs for hand placement and hopping on L foot.    Balance Overall balance assessment: Needs assistance Sitting-balance support: No upper extremity supported;Feet supported Sitting balance-Leahy Scale: Good     Standing balance support: Bilateral upper extremity supported;During functional activity Standing balance-Leahy Scale: Poor Standing balance comment: Reliant on UE support for balance                            ADL Overall ADL's : Needs assistance/impaired         Upper Body Bathing: Set up;Sitting   Lower Body Bathing: Moderate assistance;Sit to/from stand   Upper Body Dressing : Set up;Sitting   Lower Body Dressing: Moderate assistance;Sit to/from stand   Toilet Transfer: Minimal assistance;+2 for safety/equipment;Cueing for safety;Stand-pivot;Ambulation;BSC;RW   Toileting- Clothing Manipulation and Hygiene: Minimal assistance;Sit to/from stand       Functional mobility during ADLs: Minimal assistance;+2 for safety/equipment;Rolling walker General ADL Comments: +2 assist primarily for line management     Vision Vision Assessment?: No apparent visual deficits Additional Comments: Pt feels better mobilizing with glasses on   Perception     Praxis      Pertinent Vitals/Pain Pain Assessment: 0-10 Pain Score: 10-Worst pain ever Pain Location: R residual limb Pain Descriptors / Indicators: Tightness;Throbbing Pain  Intervention(s): Limited activity within patient's tolerance;Monitored during session;Premedicated before session;Repositioned     Hand Dominance Right   Extremity/Trunk Assessment Upper Extremity Assessment Upper  Extremity Assessment: Overall WFL for tasks assessed   Lower Extremity Assessment Lower Extremity Assessment: RLE deficits/detail RLE Deficits / Details: s/p Rt BKA w/ ace bandage   Cervical / Trunk Assessment Cervical / Trunk Assessment: Normal   Communication Communication Communication: No difficulties   Cognition Arousal/Alertness: Awake/alert Behavior During Therapy: WFL for tasks assessed/performed Overall Cognitive Status: Within Functional Limits for tasks assessed                     General Comments       Exercises       Shoulder Instructions      Home Living Family/patient expects to be discharged to:: Private residence Living Arrangements: Parent;Children Available Help at Discharge: Family;Available 24 hours/day Type of Home: House Home Access: Ramped entrance     Home Layout: One level     Bathroom Shower/Tub: Tub/shower unit;Curtain   Bathroom Toilet: Standard         Additional Comments: Pt's father has hx of BKA as well and has equipment, but needs it for himself.      Prior Functioning/Environment Level of Independence: Needs assistance  Gait / Transfers Assistance Needed: Was using her dad's cane and RW ambulating shorter distances.  Needed assist w/ shower transfer (daughter assisted).   ADL's / Homemaking Assistance Needed: Needed assist from daughter w/ lower body dressing.  Mother and daughter was doing the cooking and cleaning as pt could not stand for longer periods of time.   Comments: Needed assist w/ organizing her medicine at times as her vision occassionally becomes blurry which she attribuates to her diabetes.    OT Diagnosis: Acute pain   OT Problem List: Decreased strength;Decreased activity tolerance;Impaired balance (sitting and/or standing);Decreased safety awareness;Decreased knowledge of use of DME or AE;Decreased knowledge of precautions;Obesity;Pain   OT Treatment/Interventions: Self-care/ADL  training;Therapeutic exercise;Energy conservation;DME and/or AE instruction;Therapeutic activities;Balance training;Patient/family education    OT Goals(Current goals can be found in the care plan section) Acute Rehab OT Goals Patient Stated Goal: to get stronger and regain her independence OT Goal Formulation: With patient Time For Goal Achievement: 09/21/15 Potential to Achieve Goals: Good ADL Goals Pt Will Perform Grooming: with supervision;standing Pt Will Perform Upper Body Bathing: with supervision;sitting Pt Will Perform Lower Body Bathing: with supervision;sit to/from stand Pt Will Transfer to Toilet: with supervision;stand pivot transfer;bedside commode (over toilet) Pt Will Perform Toileting - Clothing Manipulation and hygiene: with supervision;sit to/from stand  OT Frequency: Min 3X/week   Barriers to D/C:            Co-evaluation PT/OT/SLP Co-Evaluation/Treatment: Yes Reason for Co-Treatment: For patient/therapist safety   OT goals addressed during session: ADL's and self-care;Proper use of Adaptive equipment and DME;Strengthening/ROM      End of Session Equipment Utilized During Treatment: Gait belt;Rolling walker;Oxygen Nurse Communication: Mobility status  Activity Tolerance: Patient tolerated treatment well Patient left: in chair;with call bell/phone within reach;with family/visitor present   Time: 1124-1200 OT Time Calculation (min): 36 min Charges:  OT General Charges $OT Visit: 1 Procedure OT Evaluation $OT Eval Moderate Complexity: 1 Procedure G-Codes:    Nils PyleJulia Mashell Sieben, OTR/L Pager: [phone]: [phone] 09/07/2015, 1:02 PM

## 2015-09-08 LAB — BASIC METABOLIC PANEL
ANION GAP: 6 (ref 5–15)
BUN: 5 mg/dL — ABNORMAL LOW (ref 6–20)
CHLORIDE: 98 mmol/L — AB (ref 101–111)
CO2: 31 mmol/L (ref 22–32)
CREATININE: 0.59 mg/dL (ref 0.44–1.00)
Calcium: 9 mg/dL (ref 8.9–10.3)
GFR calc non Af Amer: >60 mL/min (ref >60)
Glucose, Bld: 214 mg/dL — ABNORMAL HIGH (ref 65–99)
POTASSIUM: 3.8 mmol/L (ref 3.5–5.1)
SODIUM: 135 mmol/L (ref 135–145)

## 2015-09-08 LAB — CBC
HEMATOCRIT: 38.2 % (ref 36.0–46.0)
HEMOGLOBIN: 12.7 g/dL (ref 12.0–15.0)
MCH: 30.3 pg (ref 26.0–34.0)
MCHC: 33.2 g/dL (ref 30.0–36.0)
MCV: 91.2 fL (ref 78.0–100.0)
Platelets: 484 10*3/uL — ABNORMAL HIGH (ref 150–400)
RBC: 4.19 MIL/uL (ref 3.87–5.11)
RDW: 11.8 % (ref 11.5–15.5)
WBC: 10.3 10*3/uL (ref 4.0–10.5)

## 2015-09-08 LAB — GLUCOSE, CAPILLARY
GLUCOSE-CAPILLARY: 183 mg/dL — AB (ref 65–99)
GLUCOSE-CAPILLARY: 218 mg/dL — AB (ref 65–99)
GLUCOSE-CAPILLARY: 91 mg/dL (ref 65–99)
Glucose-Capillary: 178 mg/dL — ABNORMAL HIGH (ref 65–99)

## 2015-09-08 NOTE — Progress Notes (Signed)
Inpatient Diabetes Program Recommendations  AACE/ADA: New Consensus Statement on Inpatient Glycemic Control (2015)  Target Ranges:  Prepandial:   less than 140 mg/dL      Peak postprandial:   less than 180 mg/dL (1-2 hours)      Critically ill patients:  140 - 180 mg/dL   Lab Results  Component Value Date   GLUCAP 183* 09/08/2015   HGBA1C 12.1* 09/06/2015   Results for Debra [name], [name] (MRN [id number]) as of 09/08/2015 12:55  Ref. Range 09/07/2015 11:19 09/07/2015 16:19 09/07/2015 21:35 09/08/2015 05:53 09/08/2015 11:14  Glucose-Capillary Latest Ref Range: 65-99 mg/dL 409232 (H) 811234 (H) 914202 (H) 178 (H) 183 (H)   Review of Glycemic Control  Watch post-prandial blood sugars.  Inpatient Diabetes Program Recommendations:    If post-prandial blood sugars > 180 mg/dL, increase Novolog to 6 units tidwc.  Will continue to follow. Thank you. Ailene Ardshonda Myli Pae, RD, LDN, CDE Inpatient Diabetes Coordinator [phone]

## 2015-09-08 NOTE — Progress Notes (Addendum)
  Progress Note    09/08/2015 10:47 AM 2 Days Post-Op  Subjective:  Pt's pain is controlled with PCA; says she hit the back of her stump on the bed rail yesterday morning.  TM 100.2 VSS   Filed Vitals:   09/08/15 0554 09/08/15 0800  BP: 123/73   Pulse: 104   Temp: 100.2 F (37.9 C)   Resp: 18 17    Physical Exam: Incisions:  Bandaged removed and incision is clean and dry with staples in tact.  Minimal drainage on bandage that was removed.  Extremities:  Able to help lift leg for dressing change.  Some edema in stump  CBC    Component Value Date/Time   WBC 10.3 09/08/2015 0233   RBC 4.19 09/08/2015 0233   HGB 12.7 09/08/2015 0233   HCT 38.2 09/08/2015 0233   PLT 484* 09/08/2015 0233   MCV 91.2 09/08/2015 0233   MCH 30.3 09/08/2015 0233   MCHC 33.2 09/08/2015 0233   RDW 11.8 09/08/2015 0233   LYMPHSABS 4.0 06/18/2015 0116   MONOABS 0.5 06/18/2015 0116   EOSABS 0.2 06/18/2015 0116   BASOSABS 0.0 06/18/2015 0116    BMET    Component Value Date/Time   NA 135 09/08/2015 0233   K 3.8 09/08/2015 0233   CL 98* 09/08/2015 0233   CO2 31 09/08/2015 0233   GLUCOSE 214* 09/08/2015 0233   BUN 5* 09/08/2015 0233   CREATININE 0.59 09/08/2015 0233   CALCIUM 9.0 09/08/2015 0233   GFRNONAA >60 09/08/2015 0233   GFRAA >60 09/08/2015 0233    INR No results found for: INR   Intake/Output Summary (Last 24 hours) at 09/08/15 1047 Last data filed at 09/07/15 1850  Gross per 24 hour  Intake    340 ml  Output    350 ml  Net    -10 ml     Assessment/Plan:  40 y.o. female is s/p right below knee amputation  2 Days Post-Op  -stump is viable -she states that she bumped her stump on the bed rail yesterday morning, however, there is no evidence of bleeding as the incision looks good and there is minimal bloody drainage on the bandage.  -hgb is stable; WBC improved. -dressing reapplied and she tolerated well -discussed that we will leave the PCA on today and wean with oral  pain meds tomorrow -CIR recommended by PT-there are some insurance issues that will be followed up on Monday per admission coordinator CIR   Doreatha MassedSamantha Rhyne, PA-C [facility] [phone] 09/08/2015 10:47 AM           I have examined the patient, reviewed and agree with above.  Gretta BeganEarly, Ludivina Guymon, MD 09/08/2015 12:12 PM

## 2015-09-08 NOTE — Progress Notes (Signed)
Physical Therapy Treatment Patient Details Name: Reece AgarStephanie Allcorn MRN: [id number] DOB: 08/09/1975 Today's Date: 09/08/2015    History of Present Illness Pt is a 40 y/o F s/p Rt BKA.  Pt was seen in the ED on 05/24/15 with cold feet bilaterally w/ evidence of infrainguinal arterial occlusive disease.  According to her arteriogram there were no option for revascularization and no obvious source for embolization, pt w/ gangrenous Rt great toe, and pt's Rt BKA was completed on 09/06/15.  Pt's additional PMH includes back pain.    PT Comments    Patient continues to progress toward mobility goals. Tolerated gait training and therex well. Current plan remains appropriate.   Follow Up Recommendations  CIR;Supervision for mobility/OOB     Equipment Recommendations  Rolling walker with 5" wheels;3in1 (PT);Wheelchair (measurements PT);Wheelchair cushion (measurements PT)    Recommendations for Other Services OT consult;Rehab consult     Precautions / Restrictions Precautions Precautions: Fall;Other (comment) Precaution Comments: post op Rt BKA 09/06/15; Rt knee to remain in extension and no pillow under knee Restrictions Weight Bearing Restrictions: Yes RLE Weight Bearing: Non weight bearing    Mobility  Bed Mobility Overal bed mobility: Needs Assistance Bed Mobility: Supine to Sit;Sit to Supine     Supine to sit: HOB elevated;Min guard Sit to supine: Min assist   General bed mobility comments: pt used UE to bring R LE to EOB; cues for sequencing and increased time needed; assist to position R LE into bed   Transfers Overall transfer level: Needs assistance Equipment used: Rolling walker (2 wheeled) Transfers: Sit to/from Stand Sit to Stand: Min assist;Min guard         General transfer comment: assist to power up into stand from EOB and min guard for [facility] with cues for hand placement  Ambulation/Gait Ambulation/Gait assistance: Min assist Ambulation Distance (Feet): 22 Feet  (11X2; seated break) Assistive device: Rolling walker (2 wheeled) Gait Pattern/deviations: Step-to pattern     General Gait Details: cues for sequencing of gait with use of AD and line management; pt with good safety awareness   Stairs            Wheelchair Mobility    Modified Rankin (Stroke Patients Only)       Balance     Sitting balance-Leahy Scale: Good       Standing balance-Leahy Scale: Poor                      Cognition Arousal/Alertness: Awake/alert Behavior During Therapy: WFL for tasks assessed/performed Overall Cognitive Status: Within Functional Limits for tasks assessed                      Exercises Amputee Exercises Quad Sets: Strengthening;Right;10 reps;Seated Hip Flexion/Marching: Strengthening;Right;10 reps;Seated Knee Extension: Strengthening;Right;10 reps;Seated    General Comments        Pertinent Vitals/Pain Pain Assessment: 0-10 Pain Score: 5  Pain Location: R residual limb Pain Descriptors / Indicators: Throbbing;Tingling Pain Intervention(s): Limited activity within patient's tolerance;Monitored during session;Premedicated before session;Repositioned    Home Living                      Prior Function            PT Goals (current goals can now be found in the care plan section) Acute Rehab PT Goals Patient Stated Goal: walk more Progress towards PT goals: Progressing toward goals    Frequency  Min 4X/week  PT Plan Current plan remains appropriate    Co-evaluation             End of Session Equipment Utilized During Treatment: Gait belt Activity Tolerance: Patient tolerated treatment well Patient left: with call bell/phone within reach;with family/visitor present;in bed     Time: 1610-9604 PT Time Calculation (min) (ACUTE ONLY): 36 min  Charges:  $Gait Training: 8-22 mins $Therapeutic Exercise: 8-22 mins                    G Codes:      Derek Mound,  PTA Pager: [phone]   09/08/2015, 3:57 PM

## 2015-09-09 LAB — GLUCOSE, CAPILLARY
GLUCOSE-CAPILLARY: 210 mg/dL — AB (ref 65–99)
GLUCOSE-CAPILLARY: 201 mg/dL — AB (ref 65–99)
GLUCOSE-CAPILLARY: 127 mg/dL — AB (ref 65–99)
GLUCOSE-CAPILLARY: 152 mg/dL — AB (ref 65–99)

## 2015-09-09 NOTE — Progress Notes (Addendum)
  Progress Note    09/09/2015 8:16 AM 3 Days Post-Op  Subjective:  Still having some pain at night.  Worried she is going pull something and hurt it.   Afebrile HR 90's-140's ST 120's systolic 99% 2LO2NC   Filed Vitals:   09/09/15 0504 09/09/15 0610  BP: 123/75   Pulse: 101   Temp: 98.6 F (37 C)   Resp: 18 16    Physical Exam: Incisions:  Clean and dry with staples in tact.  Mild edema Extremities:  Knee still bent  CBC    Component Value Date/Time   WBC 10.3 09/08/2015 0233   RBC 4.19 09/08/2015 0233   HGB 12.7 09/08/2015 0233   HCT 38.2 09/08/2015 0233   PLT 484* 09/08/2015 0233   MCV 91.2 09/08/2015 0233   MCH 30.3 09/08/2015 0233   MCHC 33.2 09/08/2015 0233   RDW 11.8 09/08/2015 0233   LYMPHSABS 4.0 06/18/2015 0116   MONOABS 0.5 06/18/2015 0116   EOSABS 0.2 06/18/2015 0116   BASOSABS 0.0 06/18/2015 0116    BMET    Component Value Date/Time   NA 135 09/08/2015 0233   K 3.8 09/08/2015 0233   CL 98* 09/08/2015 0233   CO2 31 09/08/2015 0233   GLUCOSE 214* 09/08/2015 0233   BUN 5* 09/08/2015 0233   CREATININE 0.59 09/08/2015 0233   CALCIUM 9.0 09/08/2015 0233   GFRNONAA >60 09/08/2015 0233   GFRAA >60 09/08/2015 0233    INR No results found for: INR   Intake/Output Summary (Last 24 hours) at 09/09/15 0816 Last data filed at 09/08/15 1545  Gross per 24 hour  Intake    480 ml  Output      0 ml  Net    480 ml     Assessment/Plan:  40 y.o. female is s/p right below knee amputation  3 Days Post-Op  -stump is viable -discussed weaning PCA -discussed importance of working on straightening knee.   She is afraid she is going to hurt something that was done surgically.  Reassured her that straightening her knee is not going to damage anything surgically. -appreciate DM coordinator's help with management of her diabetes.   Doreatha MassedSamantha Rhyne, PA-C [facility] [phone] 09/09/2015 8:16 AM           I have examined the  patient, reviewed and agree with above.  Gretta BeganEarly, Todd, MD 09/09/2015 9:33 AM

## 2015-09-10 ENCOUNTER — Inpatient Hospital Stay (HOSPITAL_COMMUNITY)
Admission: RE | Admit: 2015-09-10 | Discharge: 2015-09-20 | DRG: 092 | Disposition: A | Discharge status: Home / Self Care | Payer: Medicaid Other | Source: Intra-hospital | Attending: Physical Medicine & Rehabilitation | Admitting: Physical Medicine & Rehabilitation

## 2015-09-10 DIAGNOSIS — G479 Sleep disorder, unspecified: Secondary | ICD-10-CM | POA: Diagnosis not present

## 2015-09-10 DIAGNOSIS — F4322 Adjustment disorder with anxiety: Secondary | ICD-10-CM | POA: Diagnosis present

## 2015-09-10 DIAGNOSIS — E1152 Type 2 diabetes mellitus with diabetic peripheral angiopathy with gangrene: Secondary | ICD-10-CM | POA: Diagnosis present

## 2015-09-10 DIAGNOSIS — E11621 Type 2 diabetes mellitus with foot ulcer: Secondary | ICD-10-CM

## 2015-09-10 DIAGNOSIS — F329 Major depressive disorder, single episode, unspecified: Secondary | ICD-10-CM | POA: Diagnosis present

## 2015-09-10 DIAGNOSIS — G546 Phantom limb syndrome with pain: Secondary | ICD-10-CM | POA: Diagnosis present

## 2015-09-10 DIAGNOSIS — F419 Anxiety disorder, unspecified: Secondary | ICD-10-CM | POA: Diagnosis present

## 2015-09-10 DIAGNOSIS — Z6836 Body mass index (BMI) 36.0-36.9, adult: Secondary | ICD-10-CM | POA: Diagnosis not present

## 2015-09-10 DIAGNOSIS — Z7982 Long term (current) use of aspirin: Secondary | ICD-10-CM

## 2015-09-10 DIAGNOSIS — R269 Unspecified abnormalities of gait and mobility: Secondary | ICD-10-CM | POA: Diagnosis present

## 2015-09-10 DIAGNOSIS — Z833 Family history of diabetes mellitus: Secondary | ICD-10-CM | POA: Diagnosis not present

## 2015-09-10 DIAGNOSIS — S88119A Complete traumatic amputation at level between knee and ankle, unspecified lower leg, initial encounter: Secondary | ICD-10-CM | POA: Diagnosis present

## 2015-09-10 DIAGNOSIS — Z89511 Acquired absence of right leg below knee: Secondary | ICD-10-CM

## 2015-09-10 DIAGNOSIS — E1165 Type 2 diabetes mellitus with hyperglycemia: Secondary | ICD-10-CM | POA: Diagnosis present

## 2015-09-10 DIAGNOSIS — Z7984 Long term (current) use of oral hypoglycemic drugs: Secondary | ICD-10-CM

## 2015-09-10 DIAGNOSIS — I10 Essential (primary) hypertension: Secondary | ICD-10-CM | POA: Diagnosis present

## 2015-09-10 DIAGNOSIS — S88111D Complete traumatic amputation at level between knee and ankle, right lower leg, subsequent encounter: Secondary | ICD-10-CM

## 2015-09-10 DIAGNOSIS — Z886 Allergy status to analgesic agent status: Secondary | ICD-10-CM | POA: Diagnosis not present

## 2015-09-10 DIAGNOSIS — Z79899 Other long term (current) drug therapy: Secondary | ICD-10-CM | POA: Diagnosis not present

## 2015-09-10 DIAGNOSIS — E785 Hyperlipidemia, unspecified: Secondary | ICD-10-CM | POA: Diagnosis present

## 2015-09-10 DIAGNOSIS — L97509 Non-pressure chronic ulcer of other part of unspecified foot with unspecified severity: Secondary | ICD-10-CM

## 2015-09-10 DIAGNOSIS — F17210 Nicotine dependence, cigarettes, uncomplicated: Secondary | ICD-10-CM | POA: Diagnosis present

## 2015-09-10 DIAGNOSIS — K219 Gastro-esophageal reflux disease without esophagitis: Secondary | ICD-10-CM | POA: Diagnosis present

## 2015-09-10 DIAGNOSIS — M792 Neuralgia and neuritis, unspecified: Secondary | ICD-10-CM | POA: Diagnosis present

## 2015-09-10 DIAGNOSIS — Z8249 Family history of ischemic heart disease and other diseases of the circulatory system: Secondary | ICD-10-CM | POA: Diagnosis not present

## 2015-09-10 DIAGNOSIS — E871 Hypo-osmolality and hyponatremia: Secondary | ICD-10-CM | POA: Diagnosis present

## 2015-09-10 LAB — GLUCOSE, CAPILLARY
Glucose-Capillary: 169 mg/dL — ABNORMAL HIGH (ref 65–99)
Glucose-Capillary: 146 mg/dL — ABNORMAL HIGH (ref 65–99)
Glucose-Capillary: 145 mg/dL — ABNORMAL HIGH (ref 65–99)
Glucose-Capillary: 116 mg/dL — ABNORMAL HIGH (ref 65–99)

## 2015-09-10 MED ORDER — ATORVASTATIN CALCIUM 20 MG PO TABS
20.0000 mg | ORAL_TABLET | Freq: Every day | ORAL | Status: DC
  Administered 2015-09-11 – 2015-09-20 (×10): 20 mg via ORAL
  Filled 2015-09-10 (×10): qty 1

## 2015-09-10 MED ORDER — FAMOTIDINE 20 MG PO TABS
20.0000 mg | ORAL_TABLET | Freq: Two times a day (BID) | ORAL | Status: DC
  Administered 2015-09-10 – 2015-09-20 (×20): 20 mg via ORAL
  Filled 2015-09-10 (×18): qty 1
  Filled 2015-09-10: qty 2
  Filled 2015-09-10: qty 1

## 2015-09-10 MED ORDER — INSULIN ASPART 100 UNIT/ML ~~LOC~~ SOLN
0.0000 [IU] | Freq: Three times a day (TID) | SUBCUTANEOUS | Status: DC
  Administered 2015-09-11 (×2): 3 [IU] via SUBCUTANEOUS
  Administered 2015-09-11: 5 [IU] via SUBCUTANEOUS
  Administered 2015-09-12 (×2): 3 [IU] via SUBCUTANEOUS
  Administered 2015-09-13 – 2015-09-16 (×4): 2 [IU] via SUBCUTANEOUS
  Administered 2015-09-17: 5 [IU] via SUBCUTANEOUS
  Administered 2015-09-17: 2 [IU] via SUBCUTANEOUS
  Administered 2015-09-18: 3 [IU] via SUBCUTANEOUS
  Administered 2015-09-18 – 2015-09-19 (×2): 2 [IU] via SUBCUTANEOUS

## 2015-09-10 MED ORDER — TRAMADOL HCL 50 MG PO TABS
50.0000 mg | ORAL_TABLET | Freq: Four times a day (QID) | ORAL | Status: DC | PRN
  Administered 2015-09-13 – 2015-09-19 (×4): 50 mg via ORAL
  Filled 2015-09-10 (×4): qty 1

## 2015-09-10 MED ORDER — INSULIN ASPART 100 UNIT/ML ~~LOC~~ SOLN
4.0000 [IU] | Freq: Three times a day (TID) | SUBCUTANEOUS | Status: DC
  Administered 2015-09-11 – 2015-09-12 (×4): 4 [IU] via SUBCUTANEOUS

## 2015-09-10 MED ORDER — SENNOSIDES-DOCUSATE SODIUM 8.6-50 MG PO TABS: 2.0000 | ORAL_TABLET | Freq: Every evening | ORAL | Status: DC | PRN

## 2015-09-10 MED ORDER — ACETAMINOPHEN 325 MG PO TABS: 325.0000 mg | ORAL_TABLET | ORAL | Status: DC | PRN

## 2015-09-10 MED ORDER — TRAZODONE HCL 50 MG PO TABS
25.0000 mg | ORAL_TABLET | Freq: Every evening | ORAL | Status: DC | PRN
  Administered 2015-09-14 – 2015-09-16 (×3): 50 mg via ORAL
  Filled 2015-09-10 (×3): qty 1

## 2015-09-10 MED ORDER — PHENOL 1.4 % MT LIQD: 1.0000 | OROMUCOSAL | Status: DC | PRN

## 2015-09-10 MED ORDER — METHOCARBAMOL 500 MG PO TABS
500.0000 mg | ORAL_TABLET | Freq: Four times a day (QID) | ORAL | Status: DC | PRN
  Administered 2015-09-12 – 2015-09-19 (×8): 500 mg via ORAL
  Filled 2015-09-10 (×9): qty 1

## 2015-09-10 MED ORDER — ENOXAPARIN SODIUM 40 MG/0.4ML ~~LOC~~ SOLN
40.0000 mg | SUBCUTANEOUS | Status: DC
  Administered 2015-09-11 – 2015-09-19 (×9): 40 mg via SUBCUTANEOUS
  Filled 2015-09-10 (×10): qty 0.4

## 2015-09-10 MED ORDER — VITAMIN C 500 MG PO TABS
250.0000 mg | ORAL_TABLET | Freq: Every day | ORAL | Status: DC
  Administered 2015-09-11 – 2015-09-20 (×10): 250 mg via ORAL
  Filled 2015-09-10 (×11): qty 1

## 2015-09-10 MED ORDER — OXYCODONE HCL 5 MG PO TABS
5.0000 mg | ORAL_TABLET | ORAL | Status: DC | PRN
  Administered 2015-09-11 – 2015-09-13 (×9): 10 mg via ORAL
  Administered 2015-09-13: 5 mg via ORAL
  Administered 2015-09-14 – 2015-09-19 (×15): 10 mg via ORAL
  Filled 2015-09-10 (×19): qty 2
  Filled 2015-09-10: qty 1
  Filled 2015-09-10 (×6): qty 2

## 2015-09-10 MED ORDER — ASPIRIN 325 MG PO TABS
325.0000 mg | ORAL_TABLET | Freq: Every day | ORAL | Status: DC
  Administered 2015-09-11 – 2015-09-20 (×10): 325 mg via ORAL
  Filled 2015-09-10 (×10): qty 1

## 2015-09-10 MED ORDER — ENOXAPARIN SODIUM 30 MG/0.3ML ~~LOC~~ SOLN: 30.0000 mg | SUBCUTANEOUS | Status: DC

## 2015-09-10 MED ORDER — DIPHENHYDRAMINE HCL 12.5 MG/5ML PO ELIX: 12.5000 mg | ORAL_SOLUTION | Freq: Four times a day (QID) | ORAL | Status: DC | PRN

## 2015-09-10 MED ORDER — ALUM & MAG HYDROXIDE-SIMETH 200-200-20 MG/5ML PO SUSP: 15.0000 mL | ORAL | Status: DC | PRN

## 2015-09-10 MED ORDER — PROCHLORPERAZINE MALEATE 5 MG PO TABS: 5.0000 mg | ORAL_TABLET | Freq: Four times a day (QID) | ORAL | Status: DC | PRN

## 2015-09-10 MED ORDER — LISINOPRIL 10 MG PO TABS
10.0000 mg | ORAL_TABLET | Freq: Every day | ORAL | Status: DC
  Administered 2015-09-11 – 2015-09-20 (×10): 10 mg via ORAL
  Filled 2015-09-10 (×10): qty 1

## 2015-09-10 MED ORDER — INSULIN GLARGINE 100 UNIT/ML ~~LOC~~ SOLN
25.0000 [IU] | Freq: Every day | SUBCUTANEOUS | Status: DC
  Administered 2015-09-11 – 2015-09-20 (×10): 25 [IU] via SUBCUTANEOUS
  Filled 2015-09-10 (×11): qty 0.25

## 2015-09-10 MED ORDER — VITAMIN D 1000 UNITS PO TABS
1000.0000 [IU] | ORAL_TABLET | Freq: Every day | ORAL | Status: DC
  Administered 2015-09-11 – 2015-09-20 (×10): 1000 [IU] via ORAL
  Filled 2015-09-10 (×10): qty 1

## 2015-09-10 MED ORDER — GLIMEPIRIDE 2 MG PO TABS
2.0000 mg | ORAL_TABLET | Freq: Two times a day (BID) | ORAL | Status: DC
  Administered 2015-09-10 – 2015-09-20 (×20): 2 mg via ORAL
  Filled 2015-09-10 (×4): qty 1
  Filled 2015-09-10: qty 2
  Filled 2015-09-10 (×14): qty 1

## 2015-09-10 MED ORDER — DOCUSATE SODIUM 100 MG PO CAPS
100.0000 mg | ORAL_CAPSULE | Freq: Every day | ORAL | Status: DC
  Administered 2015-09-11 – 2015-09-17 (×7): 100 mg via ORAL
  Filled 2015-09-10 (×7): qty 1

## 2015-09-10 MED ORDER — FLEET ENEMA 7-19 GM/118ML RE ENEM: 1.0000 | ENEMA | Freq: Once | RECTAL | Status: DC | PRN

## 2015-09-10 MED ORDER — GABAPENTIN 100 MG PO CAPS
100.0000 mg | ORAL_CAPSULE | Freq: Every day | ORAL | Status: DC
  Administered 2015-09-10 – 2015-09-12 (×3): 100 mg via ORAL
  Filled 2015-09-10 (×3): qty 1

## 2015-09-10 MED ORDER — GUAIFENESIN-DM 100-10 MG/5ML PO SYRP: 15.0000 mL | ORAL_SOLUTION | ORAL | Status: DC | PRN

## 2015-09-10 MED ORDER — BISACODYL 10 MG RE SUPP: 10.0000 mg | Freq: Every day | RECTAL | Status: DC | PRN

## 2015-09-10 MED ORDER — METFORMIN HCL 500 MG PO TABS
500.0000 mg | ORAL_TABLET | Freq: Two times a day (BID) | ORAL | Status: DC
  Administered 2015-09-11 – 2015-09-12 (×3): 500 mg via ORAL
  Filled 2015-09-10 (×3): qty 1

## 2015-09-10 MED ORDER — INSULIN ASPART 100 UNIT/ML ~~LOC~~ SOLN: 0.0000 [IU] | Freq: Every day | SUBCUTANEOUS | Status: DC

## 2015-09-10 MED ORDER — PROCHLORPERAZINE EDISYLATE 5 MG/ML IJ SOLN: 5.0000 mg | Freq: Four times a day (QID) | INTRAMUSCULAR | Status: DC | PRN

## 2015-09-10 MED ORDER — PROCHLORPERAZINE 25 MG RE SUPP
12.5000 mg | Freq: Four times a day (QID) | RECTAL | Status: DC | PRN
  Filled 2015-09-10: qty 1

## 2015-09-10 NOTE — H&P (View-Only) (Signed)
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 female with history of T2DM, HTN, ongoing tobacco use, coolness bilateral feet with evidence of PAD who developed progressive gangrenous changes right foot with rest pain and ischemia. She failed conservative treatment. 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 Edilia Bo. She has associated phantom limb pain. Post op therapy evaluations recommending CIR.   Review of Systems  HENT: Negative for hearing loss.   Eyes: Negative for blurred vision and double vision.  Respiratory: Negative for cough and shortness of breath.   Cardiovascular: Negative for chest pain and palpitations.  Gastrointestinal: Negative for heartburn, nausea and constipation.  Genitourinary: Negative for dysuria and urgency.  Musculoskeletal: Positive for back pain. Negative for myalgias.  Skin: Negative for itching and rash.  Neurological: Positive for dizziness, sensory change and weakness. Negative for headaches.  Psychiatric/Behavioral: The patient is nervous/anxious and has insomnia.   All other systems reviewed and are negative.   Past Medical History  Diagnosis Date  . Diabetes mellitus without complication (HCC)   . Back pain   . Abscess   . Hyperlipidemia   . Hypertension   . Peripheral vascular disease (HCC)   . Bronchitis   . Pneumonia   . Goiter   . Anxiety   . Depression   . GERD (gastroesophageal reflux disease)   . Headache     migraines    Past Surgical History  Procedure Laterality Date  . Peripheral vascular catheterization N/A 05/28/2015    Procedure: Abdominal Aortogram;  Surgeon: Chuck Hint, MD;  Location: [facility] INVASIVE CV LAB;  Service: Cardiovascular;  Laterality: N/A;  . 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 Amputation Right 09/06/2015    Procedure: AMPUTATION BELOW KNEE;  Surgeon: Chuck Hint, MD;  Location: [facility] OR;  Service:  Vascular;  Laterality: Right;    Family History  Problem Relation Age of Onset  . Diabetes Mother   . Hypertension Mother   . Hyperlipidemia Mother   . Diabetes Father   . Heart disease Father     Social History:  Lives alone--plans on moving in with parents who have ramp at entry and mother in good health. Mother and daughter can assist after discharge? Has been unemployed for past 3-4 years. She reports that she has been smoking one pack /2-3 days. Her smoking use included Cigarettes. She has a 5 pack-year smoking history. She has never used smokeless tobacco. She reports that she does not drink alcohol or use illicit drugs.    Allergies  Allergen Reactions  . Ibuprofen Other (See Comments)    Upset stomach    Medications Prior to Admission  Medication Sig Dispense Refill  . aspirin 325 MG tablet Take 1 tablet (325 mg total) by mouth daily. 30 tablet 1  . atorvastatin (LIPITOR) 20 MG tablet Take 20 mg by mouth daily.    . cephALEXin (KEFLEX) 500 MG capsule Take 1 capsule (500 mg total) by mouth 3 (three) times daily. 42 capsule 2  . cholecalciferol (VITAMIN D) 1000 units tablet Take 1,000 Units by mouth daily.    Marland Kitchen glimepiride (AMARYL) 2 MG tablet Take 2 mg by mouth daily with breakfast.    . glipiZIDE (GLUCOTROL) 10 MG tablet Take 1 tablet (10 mg total) by mouth 2 (two) times daily before a meal. (Patient taking differently: Take 5 mg by mouth 2 (two) times daily before a  meal. ) 60 tablet 3  . lisinopril (PRINIVIL,ZESTRIL) 5 MG tablet Take 1 tablet (5 mg total) by mouth daily. (Patient taking differently: Take 10 mg by mouth daily. ) 30 tablet 3  . metFORMIN (GLUCOPHAGE) 500 MG tablet Take 500 mg by mouth 2 (two) times daily with a meal.     . nitroGLYCERIN (NITRODUR - DOSED IN MG/24 HR) 0.4 mg/hr patch Apply to right foot near big toe daily 30 patch 0  . oxyCODONE (OXY IR/ROXICODONE) 5 MG immediate release tablet Take 1 tablet (5 mg total) by mouth every 6 (six) hours as needed  for severe pain. 30 tablet 0  . vitamin C (ASCORBIC ACID) 250 MG tablet Take 250 mg by mouth daily.    . cephALEXin (KEFLEX) 500 MG capsule Take 1 capsule (500 mg total) by mouth 3 (three) times daily. (Patient not taking: Reported on 08/30/2015) 42 capsule 1    Home: Home Living Family/patient expects to be discharged to:: Private residence Living Arrangements: Parent, Children Available Help at Discharge: Family, Available 24 hours/day Type of Home: House Home Access: Ramped entrance Home Layout: One level Bathroom Shower/Tub: Tub/shower unit, Engineer, building services: Standard Additional Comments: Pt's father has hx of BKA as well and has equipment, but needs it for himself.   Functional History: Prior Function Level of Independence: Needs assistance Gait / Transfers Assistance Needed: Was using her dad's cane and RW ambulating shorter distances.  Needed assist w/ shower transfer (daughter assisted).   ADL's / Homemaking Assistance Needed: Needed assist from daughter w/ lower body dressing.  Mother and daughter was doing the cooking and cleaning as pt could not stand for longer periods of time. Comments: Needed assist w/ organizing her medicine at times as her vision occassionally becomes blurry which she attribuates to her diabetes.  Functional Status:  Mobility: Bed Mobility Overal bed mobility: Needs Assistance Bed Mobility: Supine to Sit, Sit to Supine Supine to sit: HOB elevated, Min guard Sit to supine: Min assist General bed mobility comments: pt used UE to bring R LE to EOB; cues for sequencing and increased time needed; assist to position R LE into bed  Transfers Overall transfer level: Needs assistance Equipment used: Rolling walker (2 wheeled) Transfers: Sit to/from Stand Sit to Stand: Min assist, Min guard Stand pivot transfers: Min assist, +2 safety/equipment General transfer comment: assist to power up into stand from EOB and min guard for [facility] with cues for hand  placement Ambulation/Gait Ambulation/Gait assistance: Min assist Ambulation Distance (Feet): 22 Feet (11X2; seated break) Assistive device: Rolling walker (2 wheeled) Gait Pattern/deviations: Step-to pattern General Gait Details: cues for sequencing of gait with use of AD and line management; pt with good safety awareness    ADL: ADL Overall ADL's : Needs assistance/impaired Upper Body Bathing: Set up, Sitting Lower Body Bathing: Moderate assistance, Sit to/from stand Upper Body Dressing : Set up, Sitting Lower Body Dressing: Moderate assistance, Sit to/from stand Toilet Transfer: Minimal assistance, +2 for safety/equipment, Cueing for safety, Stand-pivot, Ambulation, BSC, RW Toileting- Clothing Manipulation and Hygiene: Minimal assistance, Sit to/from stand Functional mobility during ADLs: Minimal assistance, +2 for safety/equipment, Rolling walker General ADL Comments: +2 assist primarily for line management  Cognition: Cognition Overall Cognitive Status: Within Functional Limits for tasks assessed Orientation Level: Oriented X4 Cognition Arousal/Alertness: Awake/alert Behavior During Therapy: WFL for tasks assessed/performed Overall Cognitive Status: Within Functional Limits for tasks assessed  Physical Exam: Blood pressure 126/74, pulse 91, temperature 98.4 F (36.9 C), temperature source Oral, resp. rate  15, height 5\' 10"  (1.778 m), weight 116.121 kg (256 lb), last menstrual period 08/22/2015, SpO2 95 %. Physical Exam  Nursing note and vitals reviewed. Constitutional: She is oriented to person, place, and time. She appears well-developed and well-nourished.  HENT:  Head: Normocephalic and atraumatic.  Mouth/Throat: Oropharynx is clear and moist.  Eyes: Conjunctivae and EOM are normal. Pupils are equal, round, and reactive to light.  Neck: Normal range of motion. Neck supple.  Cardiovascular: Normal rate and regular rhythm.   Respiratory: Effort normal and breath sounds  normal. No respiratory distress. She has no wheezes.  GI: Soft. Bowel sounds are normal. She exhibits no distension. There is no tenderness.  Musculoskeletal: She exhibits edema and tenderness.  1+ edema LLE. R-BKA with moderate edema--staples intact and incision clean and dry without erythema. Tender to touch and had difficulty fully extending  at knee without pain.   Neurological: She is alert and oriented to person, place, and time.  Able to follow simple motor commands without difficulty.  Motor: B/l UE, LLE: 5/5 proximal to distal RLE: 3/5 hip flexion (pain inhibition)   Skin: Skin is warm and dry.  See Musc  Psychiatric: Her speech is normal. Thought content normal. Her mood appears anxious. She is slowed. Cognition and memory are normal.    Results for orders placed or performed during the hospital encounter of 09/06/15 (from the past 48 hour(s))  Glucose, capillary     Status: Abnormal   Collection Time: 09/08/15  4:07 PM  Result Value Ref Range   Glucose-Capillary 218 (H) 65 - 99 mg/dL  Glucose, capillary     Status: None   Collection Time: 09/08/15  9:14 PM  Result Value Ref Range   Glucose-Capillary 91 65 - 99 mg/dL  Glucose, capillary     Status: Abnormal   Collection Time: 09/09/15  6:49 AM  Result Value Ref Range   Glucose-Capillary 210 (H) 65 - 99 mg/dL  Glucose, capillary     Status: Abnormal   Collection Time: 09/09/15 11:32 AM  Result Value Ref Range   Glucose-Capillary 201 (H) 65 - 99 mg/dL  Glucose, capillary     Status: Abnormal   Collection Time: 09/09/15  4:12 PM  Result Value Ref Range   Glucose-Capillary 127 (H) 65 - 99 mg/dL  Glucose, capillary     Status: Abnormal   Collection Time: 09/09/15  8:29 PM  Result Value Ref Range   Glucose-Capillary 152 (H) 65 - 99 mg/dL  Glucose, capillary     Status: Abnormal   Collection Time: 09/10/15  6:03 AM  Result Value Ref Range   Glucose-Capillary 169 (H) 65 - 99 mg/dL  Glucose, capillary     Status: Abnormal     Collection Time: 09/10/15 11:44 AM  Result Value Ref Range   Glucose-Capillary 146 (H) 65 - 99 mg/dL   No results found.     Medical Problem List and Plan: 1.  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 2.  DVT Prophylaxis/Anticoagulation: Pharmaceutical: Lovenox 3. Pain Management: Oxycodone prn. Will add low dose neurontin for neuropathy and re-educated on need to limit narcotics.  4. Mood: LCSW to follow for evaluation and support.  5. Neuropsych: This patient is capable of making decisions on her own behalf. 6. Skin/Wound Care: Monitor wound daily for healing. Maintain adequate nutrition and hydration status.  7. Fluids/Electrolytes/Nutrition: Monitor I/O. Check lytes in am.  8. HTN:  Will monitor BP bid. Continue lisinopril daily.  9.T2DM: Poorly controlled with A1c-12.1. Has  been on medications for past 6 years and reports compliance. Change glucotrol to amaryl as at home. Continue metformin bid. Was started on Lantus per diabetes coordinator--patient's parents diabetics and she is agreeable.  10. GERD: At times due to intake--will change Protonix to Pepcid.  11. PAD: Cont meds 12. Hyponatremia: Follow labs 13. Morbid Obesity: Body mass index is 36.73 kg/(m^2). Diet and exercise education.  Encourage weight loss to increase endurance and promote overall health 14. Tobacco abuse: Counsel   Post Admission Physician Evaluation: 1. Functional deficits secondary  to right BKA. 2. Patient is admitted to receive collaborative, interdisciplinary care between the physiatrist, rehab nursing staff, and therapy team. 3. Patient's level of medical complexity and substantial therapy needs in context of that medical necessity cannot be provided at a lesser intensity of care such as a SNF. 4. Patient has experienced substantial functional loss from his/her baseline which was documented above under the "Functional History" and "Functional Status" headings.  Judging by the patient's diagnosis,  physical exam, and functional history, the patient has potential for functional progress which will result in measurable gains while on inpatient rehab.  These gains will be of substantial and practical use upon discharge  in facilitating mobility and self-care at the household level. 5. Physiatrist will provide 24 hour management of medical needs as well as oversight of the therapy plan/treatment and provide guidance as appropriate regarding the interaction of the two. 6. 24 hour rehab nursing will assist with bladder management, safety, skin/wound care, disease management, medication administration, pain management and patient education and help integrate therapy concepts, techniques,education, etc. 7. PT will assess and treat for/with: Lower extremity strength, range of motion, stamina, balance, functional mobility, safety, adaptive techniques and equipment, woundcare, coping skills, pain control, education.   Goals are: Mod I at wheelchair level. 8. OT will assess and treat for/with: ADL's, functional mobility, safety, upper extremity strength, adaptive techniques and equipment, wound mgt, ego support, and community reintegration.   Goals are: Mod I at wheelchair level. Therapy may not proceed with showering this patient. 9. Case Management and Social Worker will assess and treat for psychological issues and discharge planning. 10. Team conference will be held weekly to assess progress toward goals and to determine barriers to discharge. 11. Patient will receive at least 3 hours of therapy per day at least 5 days per week. 12. ELOS: 7-10 days.       13. Prognosis:  good   Maryla Morrow, MD 09/10/2015

## 2015-09-10 NOTE — Progress Notes (Addendum)
  [facility] Progress Note  Subjective  - POD #4  Pain is better.   Objective Filed Vitals:   09/10/15 0000 09/10/15 0601  BP:  119/61  Pulse:  91  Temp:  98.4 F (36.9 C)  Resp: 16 15    Intake/Output Summary (Last 24 hours) at 09/10/15 0728 Last data filed at 09/09/15 1507  Gross per 24 hour  Intake    480 ml  Output      0 ml  Net    480 ml   Right BKA incision clean and intact. Skin edges viable. Mild edema.   Assessment/Planning: 40 y.o. female is s/p: right below knee amputation 4 Days Post-Op   Right BKA healing.  Pain is improving. D/c PCA today.  Wean off 02.  Emphasized importance of straightening right knee.  CIR to evaluate today.   Raymond GurneyKimberly A Trinh 09/10/2015 7:28 AM --  Laboratory CBC    Component Value Date/Time   WBC 10.3 09/08/2015 0233   HGB 12.7 09/08/2015 0233   HCT 38.2 09/08/2015 0233   PLT 484* 09/08/2015 0233    BMET    Component Value Date/Time   NA 135 09/08/2015 0233   K 3.8 09/08/2015 0233   CL 98* 09/08/2015 0233   CO2 31 09/08/2015 0233   GLUCOSE 214* 09/08/2015 0233   BUN 5* 09/08/2015 0233   CREATININE 0.59 09/08/2015 0233   CALCIUM 9.0 09/08/2015 0233   GFRNONAA >60 09/08/2015 0233   GFRAA >60 09/08/2015 0233    COAG No results found for: INR, PROTIME No results found for: PTT  Antibiotics Anti-infectives    Start     Dose/Rate Route Frequency Ordered Stop   09/06/15 1330  cefUROXime (ZINACEF) 1.5 g in dextrose 5 % 50 mL IVPB     1.5 g 100 mL/hr over 30 Minutes Intravenous Every 12 hours 09/06/15 1226 09/07/15 0219   09/06/15 0900  cefUROXime (ZINACEF) 1.5 g in dextrose 5 % 50 mL IVPB     1.5 g 100 mL/hr over 30 Minutes Intravenous To ShortStay Surgical 09/05/15 0746 09/06/15 0953     Maris BergerKimberly Trinh, PA-C [facility] Office: [phone] Pager: [phone] 09/10/2015 7:28 AM  I have interviewed the patient and examined the patient. I agree with the findings by  the PA.  Cari Carawayhris Dickson, MD [phone]

## 2015-09-10 NOTE — Progress Notes (Signed)
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 Edilia Boickson.    HPI: Debra AgarStephanie [name] is a 40 y.o. female with history of T2DM, HTN, ongoing tobacco use, coolness bilateral feet with evidence of PAD who developed progressive gangrenous changes right foot with rest pain and ischemia. She failed conservative treatment. 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 Edilia Boickson. Post op therapy evaluations to be done today. She has associated phantom limb pain. CIR recommended by MD in anticipation of rehab needs.    Review of Systems  HENT: Negative for hearing loss.  Eyes: Negative for blurred vision and double vision.  Respiratory: Negative for cough and shortness of breath.  Cardiovascular: Negative for chest pain and palpitations.  Gastrointestinal: Positive for heartburn. Negative for abdominal pain and constipation.  Genitourinary: Negative for dysuria.  Musculoskeletal: Positive for myalgias, joint pain (right hip problems due to trauma) and falls (gets dizzy/lightheaded if she bends over).  Neurological: Positive for dizziness and weakness. Negative for headaches.   +Phantom limb pain  Psychiatric/Behavioral: Negative for depression and memory loss. The patient is not nervous/anxious.  All other systems reviewed and are negative.     Past Medical History  Diagnosis Date  . Diabetes mellitus without complication (HCC)   . Back pain   . Abscess   . Hyperlipidemia   . Hypertension   . Peripheral vascular disease (HCC)   . Bronchitis   . Pneumonia   . Goiter   . Anxiety   . Depression   . GERD (gastroesophageal  reflux disease)   . Headache     migraines    Past Surgical History  Procedure Laterality Date  . Peripheral vascular catheterization N/A 05/28/2015    Procedure: Abdominal Aortogram; Surgeon: Chuck Hinthristopher S Dickson, MD; [facility]; Service: Cardiovascular; Laterality: N/A;  . 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 Diabetes Mother   . Hypertension Mother   . Hyperlipidemia Mother   . Diabetes Father   . Heart disease Father     Social History: Lives alone--plans on moving in with parents who have ramp at entry and mother in good health. Mother and daughter can assist after discharge? she reports that she has been smoking one pack /2-3 days. Her smoking use included Cigarettes. She has a 5 pack-year smoking history. She has never used smokeless tobacco. She reports that she does not drink alcohol or use illicit drugs.    Allergies  Allergen Reactions  . Ibuprofen Other (See Comments)    Upset stomach    Medications Prior to Admission  Medication Sig Dispense Refill  . aspirin 325 MG tablet Take 1 tablet (325 mg total) by mouth daily. 30 tablet 1  . atorvastatin (LIPITOR) 20 MG tablet Take 20 mg by mouth daily.    . cephALEXin (KEFLEX) 500 MG capsule Take 1 capsule (500 mg total) by mouth 3 (three) times daily. 42 capsule 2  . cholecalciferol (VITAMIN D) 1000 units tablet Take 1,000 Units by mouth daily.    Marland Kitchen. glimepiride (AMARYL) 2 MG tablet Take 2 mg by mouth daily with breakfast.    .  glipiZIDE (GLUCOTROL) 10 MG tablet Take 1 tablet (10 mg total) by mouth 2 (two) times daily before a meal. (Patient taking differently: Take 5 mg by mouth 2 (two) times daily before a meal. ) 60 tablet 3  . lisinopril (PRINIVIL,ZESTRIL) 5 MG tablet Take 1 tablet (5 mg total) by mouth daily. (Patient taking differently:  Take 10 mg by mouth daily. ) 30 tablet 3  . metFORMIN (GLUCOPHAGE) 500 MG tablet Take 500 mg by mouth 2 (two) times daily with a meal.     . nitroGLYCERIN (NITRODUR - DOSED IN MG/24 HR) 0.4 mg/hr patch Apply to right foot near big toe daily 30 patch 0  . oxyCODONE (OXY IR/ROXICODONE) 5 MG immediate release tablet Take 1 tablet (5 mg total) by mouth every 6 (six) hours as needed for severe pain. 30 tablet 0  . vitamin C (ASCORBIC ACID) 250 MG tablet Take 250 mg by mouth daily.    . cephALEXin (KEFLEX) 500 MG capsule Take 1 capsule (500 mg total) by mouth 3 (three) times daily. (Patient not taking: Reported on 08/30/2015) 42 capsule 1    Home: Home Living Family/patient expects to be discharged to:: Unsure Living Arrangements: Parent Available Help at Discharge: Family, Available 24 hours/day (will be moving back in with parents at d/c) Type of Home: House Home Access: Ramped entrance Home Layout: One level Bathroom Shower/Tub: Tub/shower unit, Engineer, building services: Standard Additional Comments: Father has h/o Rt BKA as well.  Functional History: Prior Function Level of Independence: Needs assistance Gait / Transfers Assistance Needed: Was using her dad's cane and RW ambulating shorter distances. Needed assist w/ shower transfer (daughter assisted).  ADL's / Homemaking Assistance Needed: Needed assist from daughter w/ lower body dressing. Mother and daughter was doing the cooking and cleaning as pt could not stand for longer periods of time. Comments: Needed assist w/ organizing her medicine at times as her vision occassionally becomes blurry which she attribuates to her diabetes. Functional Status:  Mobility: Bed Mobility Overal bed mobility: Needs Assistance Bed Mobility: Supine to Sit, Sit to Supine Supine to sit: Min guard, HOB elevated Sit to supine: Min guard General bed mobility comments: Increased time and use of bed rail. Transfers General  transfer comment: deferred as order is for OOB to chair POD #1      ADL:    Cognition: Cognition Overall Cognitive Status: Within Functional Limits for tasks assessed Orientation Level: Oriented X4 Cognition Arousal/Alertness: Awake/alert Behavior During Therapy: WFL for tasks assessed/performed Overall Cognitive Status: Within Functional Limits for tasks assessed   Blood pressure 141/85, pulse 113, temperature 98.7 F (37.1 C), temperature source Oral, resp. rate 20, height 5\' 10"  (1.778 m), weight 116.121 kg (256 lb), last menstrual period 08/22/2015, SpO2 96 %. Physical Exam  Nursing note and vitals reviewed. Constitutional: She is oriented to person, place, and time. She appears well-developed and well-nourished. She is easily aroused. Nasal cannula in place.  HENT:  Head: Normocephalic and atraumatic.  Mouth/Throat: Oropharynx is clear and moist.  Eyes: Conjunctivae and EOM are normal. Pupils are equal, round, and reactive to light. Right eye exhibits no discharge.  Neck: Normal range of motion. Neck supple.  Cardiovascular: Regular rhythm.  Tachycardic  Respiratory: Effort normal and breath sounds normal. No stridor. No respiratory distress. She has no wheezes.  GI: Soft. Bowel sounds are normal. She exhibits no distension. There is no tenderness.  Musculoskeletal: She exhibits edema and tenderness.  R-BKA with dry dressing.  Neurological: She is alert, oriented  to person, place, and time and easily aroused.  Able to follow simple motor commands without difficulty.  Motor: B/l UE, LLE: 5/5 proximal to distal RLE: 3/5 hip flexion (pain inhibition)  Skin: Skin is warm and dry. No rash noted. No erythema.  Psychiatric: She has a normal mood and affect. She is slowed. Cognition and memory are normal.     Lab Results Last 24 Hours    Results for orders placed or performed during the hospital encounter of 09/06/15 (from the past 24 hour(s))  Glucose, capillary  Status: Abnormal   Collection Time: 09/06/15 11:18 AM  Result Value Ref Range   Glucose-Capillary 217 (H) 65 - 99 mg/dL  CBC Status: Abnormal   Collection Time: 09/06/15 12:35 PM  Result Value Ref Range   WBC 6.4 4.0 - 10.5 K/uL   RBC 4.16 3.87 - 5.11 MIL/uL   Hemoglobin 12.8 12.0 - 15.0 g/dL   HCT 16.1 09.6 - 04.5 %   MCV 90.6 78.0 - 100.0 fL   MCH 30.8 26.0 - 34.0 pg   MCHC 34.0 30.0 - 36.0 g/dL   RDW 40.9 81.1 - 91.4 %   Platelets 439 (H) 150 - 400 K/uL  Creatinine, serum Status: None   Collection Time: 09/06/15 12:35 PM  Result Value Ref Range   Creatinine, Ser 0.56 0.44 - 1.00 mg/dL   GFR calc non Af Amer >60 >60 mL/min   GFR calc Af Amer >60 >60 mL/min  Glucose, capillary Status: Abnormal   Collection Time: 09/06/15 1:40 PM  Result Value Ref Range   Glucose-Capillary 119 (H) 65 - 99 mg/dL  Glucose, capillary Status: Abnormal   Collection Time: 09/06/15 4:27 PM  Result Value Ref Range   Glucose-Capillary 278 (H) 65 - 99 mg/dL   Comment 1 Notify RN    Comment 2 Document in Chart   Glucose, capillary Status: Abnormal   Collection Time: 09/06/15 8:50 PM  Result Value Ref Range   Glucose-Capillary 153 (H) 65 - 99 mg/dL  Basic metabolic panel Status: Abnormal   Collection Time: 09/07/15 2:34 AM  Result Value Ref Range   Sodium 134 (L) 135 - 145 mmol/L   Potassium 3.8 3.5 - 5.1 mmol/L   Chloride 101 101 - 111 mmol/L   CO2 24 22 - 32 mmol/L   Glucose, Bld 227 (H) 65 - 99 mg/dL   BUN 6 6 - 20 mg/dL   Creatinine, Ser 7.82 0.44 - 1.00 mg/dL   Calcium 8.6 (L) 8.9 - 10.3 mg/dL   GFR calc non Af Amer >60 >60 mL/min   GFR calc Af Amer >60 >60 mL/min   Anion gap 9 5 - 15  CBC Status: Abnormal   Collection Time: 09/07/15 2:34 AM  Result Value Ref Range   WBC 13.0 (H) 4.0 - 10.5  K/uL   RBC 4.15 3.87 - 5.11 MIL/uL   Hemoglobin 12.9 12.0 - 15.0 g/dL   HCT 95.6 21.3 - 08.6 %   MCV 90.6 78.0 - 100.0 fL   MCH 31.1 26.0 - 34.0 pg   MCHC 34.3 30.0 - 36.0 g/dL   RDW 57.8 46.9 - 62.9 %   Platelets 485 (H) 150 - 400 K/uL  Glucose, capillary Status: Abnormal   Collection Time: 09/07/15 6:36 AM  Result Value Ref Range   Glucose-Capillary 263 (H) 65 - 99 mg/dL      Imaging Results (Last 48 hours)    No results found.    Assessment/Plan: Diagnosis: R-BKA due to gangrenous  changes Labs and images independently reviewed. Records reviewed and summated above. Wheelchair training Clean amputation daily with soap and water Monitor incision site for signs of infection or impending skin breakdown. Staples to remain in place for 3-4 weeks Stump shrinker, for edema control  Scar mobilization massaging to prevent soft tissue adherence Stump protector during therapies Prevent flexion contractures by implementing the following:  Encourage prone lying for 20-30 mins per day BID to avoid hip flexion Contractures if medically appropriate; Avoid pillow under knees when patient is lying in bed in order to prevent both knee and hip flexion contractures; Avoid prolonged sitting Post surgical pain control with oral medication Phantom limb pain control with physical modalities including desensitization techniques (gentle self massage to the residual stump,hot packs if sensation iintact, [place]) and mirror therapy, TENS. If ineffective, consider pharmacological treatment for neuropathic pain (e.g gabapentin, pregabalin, amytriptalyine, duloxetine).  When using wheelchair, patient should have knee on amputated side fully extended with board under the seat cushion. Avoid injury to contralateral side  1. Does the need for close, 24 hr/day medical supervision in concert with the  patient's rehab needs make it unreasonable for this patient to be served in a less intensive setting? Yes  2. Co-Morbidities requiring supervision/potential complications: T2DM (Monitor in accordance with exercise and adjust meds as necessary), HTN (monitor and provide prns in accordance with increased physical exertion and pain), ongoing tobacco use (counsel), PAD (cont meds), Tachycardia (monitor in accordance with pain and increasing activity), leukocytosis (cont to monitor for signs and symptoms of infection, further workup if indicated), hyponatremia (cont to monitor, treat if necessary), hyperkalemia (cont to monitor, treat as necessary), post-op pain (Biofeedback training with therapies to help reduce reliance on opiate pain medications, monitor pain control during therapies, and sedation at rest and titrate to maximum efficacy to ensure participation and gains in therapies), obesity (Body mass index is 36.73 kg/(m^2)., diet and exercise education, encourage weight loss to increase endurance and promote overall health) 3. Due to bladder management, safety, skin/wound care, disease management, medication administration, pain management and patient education, does the patient require 24 hr/day rehab nursing? Yes 4. Does the patient require coordinated care of a physician, rehab nurse, PT (1-2 hrs/day, 5 days/week) and OT (1-2 hrs/day, 5 days/week) to address physical and functional deficits in the context of the above medical diagnosis(es)? Yes Addressing deficits in the following areas: balance, endurance, locomotion, strength, transferring, bathing, dressing, toileting and psychosocial support 5. Can the patient actively participate in an intensive therapy program of at least 3 hrs of therapy per day at least 5 days per week? Potentially 6. The potential for patient to make measurable gains while on inpatient rehab is excellent 7. Anticipated functional outcomes upon discharge from inpatient rehab are  TBD with PT, TBD with OT, n/a with SLP. 8. Estimated rehab length of stay to reach the above functional goals is: TBD, likely 8-12 days. 9. Does the patient have adequate social supports and living environment to accommodate these discharge functional goals? Yes 10. Anticipated D/C setting: Home 11. Anticipated post D/C treatments: HH therapy and Home excercise program 12. Overall Rehab/Functional Prognosis: good  RECOMMENDATIONS: This patient's condition is appropriate for continued rehabilitative care in the following setting: CIR once medically stable and able to tolerate 3 hours therapy/day. Patient has agreed to participate in recommended program. Yes Note that insurance prior authorization may be required for reimbursement for recommended care.  Comment: Rehab Admissions Coordinator to follow up.  Maryla Morrow, MD 09/07/2015  Revision History     Date/Time User Provider Type Action   09/07/2015 2:22 PM Ankit Karis Juba, MD Physician Sign   09/07/2015 10:30 AM Jacquelynn Cree, PA-C Physician Assistant Share   View Details Report       Routing History     Date/Time From To Method   09/07/2015 2:22 PM Ankit Karis Juba, MD Jaclyn Shaggy, MD In [facility]

## 2015-09-10 NOTE — PMR Pre-admission (Signed)
PMR Admission Coordinator Pre-Admission Assessment  Patient: Debra [name] is an 40 y.o., female MRN: [id number] DOB: [birth year] Height: 5\' 10"  (177.8 cm) Weight: 116.121 kg (256 lb)              Insurance Information HMO:     PPO:      PCP:      IPA:      80/20:      OTHER:  PRIMARY:  Medicaid  (for family planning services only at this time).  Spoke with financial counselor Wallace Cullens who stated she is working to get pt's Medicaid switched to full Medicaid.  Pt. understands that though this is likely per financial counselor, this is not a guarantee, and she wishes to proceed with IP Rehab admission.       Policy#:  [id number]      Subscriber: self CM Name:        Phone#:      Fax#:  Pre-Cert#: eligibility code [id number] effective as  Of 01/31/16     Employer: Benefits:  Phone #: [phone]    Name:  Eff. Date:  Effective as of 01/31/15     Deduct:       Out of Pocket Max:       Life Max:  CIR:       SNF:  Outpatient:      Co-Pay:  Home Health:       Co-Pay:  DME:      Co-Pay:  Providers:  SECONDARY:       Policy#:       Subscriber:  CM Name:       Phone#:      Fax#:  Pre-Cert#:       Employer:  Benefits:  Phone #:      Name:  Eff. 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 female with history of T2DM, HTN, ongoing tobacco use, coolness bilateral feet with evidence of PAD who developed progressive gangrenous changes right foot with rest pain and ischemia. She failed conservative treatment. She was  admitted on 09/06/15 for R-BKA by Dr. Edilia Bo. Post op therapy evaluations recommending IP Rehab.   Pt. Was admitted to IP Rehab on 09/10/15.      Past Medical History  Past Medical History  Diagnosis Date  . Diabetes mellitus without complication (HCC)   . Back pain   . Abscess   . Hyperlipidemia   . Hypertension   . Peripheral vascular disease (HCC)   . Bronchitis   . Pneumonia   . Goiter   . Anxiety   . Depression   . GERD (gastroesophageal reflux disease)   . Headache     migraines    Family History  family history includes Diabetes in her father and mother;  Heart disease in her father; Hyperlipidemia in her mother; Hypertension in her mother.  Prior Rehab/Hospitalizations:  Has the patient had major surgery during 100 days prior to admission? No  Current Medications   Current facility-administered medications:  .  0.9 %  sodium chloride infusion, , Intravenous, Continuous, Samantha J Rhyne, PA-C, Last Rate: 100 mL/hr at 09/06/15 1257 .  acetaminophen (TYLENOL) tablet 325-650 mg, 325-650 mg, Oral, Q4H PRN **OR** acetaminophen (TYLENOL) suppository 325-650 mg, 325-650 mg, Rectal, Q4H PRN, Samantha J Rhyne, PA-C .  alum & mag hydroxide-simeth (MAALOX/MYLANTA) 200-200-20 MG/5ML suspension 15-30 mL, 15-30 mL, Oral, Q2H PRN, Samantha J Rhyne, PA-C .  aspirin tablet 325 mg, 325 mg, Oral, Daily, Samantha J Rhyne, PA-C, 325 mg at 09/10/15 1050 .  atorvastatin (LIPITOR) tablet 20 mg, 20 mg, Oral, Daily, Samantha J Rhyne, PA-C, 20 mg at 09/10/15 1050 .  bisacodyl (DULCOLAX) suppository 10 mg, 10 mg, Rectal, Daily PRN, Samantha J Rhyne, PA-C .  cholecalciferol (VITAMIN D) tablet 1,000 Units, 1,000 Units, Oral, Daily, Samantha J Rhyne, PA-C, 1,000 Units at 09/10/15 1050 .  docusate sodium (COLACE) capsule 100 mg, 100 mg, Oral, Daily, Samantha J Rhyne, PA-C, 100 mg at 09/10/15 1051 .  enoxaparin (LOVENOX) injection 30 mg, 30 mg, Subcutaneous, Q24H, Samantha J Rhyne, PA-C, 30 mg at  09/09/15 1152 .  glipiZIDE (GLUCOTROL) tablet 5 mg, 5 mg, Oral, BID AC, Samantha J Rhyne, PA-C, 5 mg at 09/10/15 0727 .  guaiFENesin-dextromethorphan (ROBITUSSIN DM) 100-10 MG/5ML syrup 15 mL, 15 mL, Oral, Q4H PRN, Samantha J Rhyne, PA-C .  hydrALAZINE (APRESOLINE) injection 5 mg, 5 mg, Intravenous, Q20 Min PRN, Samantha J Rhyne, PA-C .  insulin aspart (novoLOG) injection 0-15 Units, 0-15 Units, Subcutaneous, TID WC, Samantha J Rhyne, PA-C, 2 Units at 09/10/15 0727 .  insulin aspart (novoLOG) injection 4 Units, 4 Units, Subcutaneous, TID WC, Chuck Hint, MD, 4 Units at 09/10/15 [phone] .  insulin glargine (LANTUS) injection 25 Units, 25 Units, Subcutaneous, Daily, Chuck Hint, MD, 25 Units at 09/10/15 1051 .  labetalol (NORMODYNE,TRANDATE) injection 10 mg, 10 mg, Intravenous, Q10 min PRN, Samantha J Rhyne, PA-C .  lisinopril (PRINIVIL,ZESTRIL) tablet 10 mg, 10 mg, Oral, Daily, Samantha J Rhyne, PA-C, 10 mg at 09/10/15 1051 .  magnesium hydroxide (MILK OF MAGNESIA) suspension 30 mL, 30 mL, Oral, Daily PRN, Samantha J Rhyne, PA-C, 30 mL at 09/10/15 1049 .  metFORMIN (GLUCOPHAGE) tablet 500 mg, 500 mg, Oral, BID WC, Samantha J Rhyne, PA-C, 500 mg at 09/10/15 0727 .  metoprolol (LOPRESSOR) injection 2-5 mg, 2-5 mg, Intravenous, Q2H PRN, Samantha J Rhyne, PA-C .  morphine 2 MG/ML injection 2-3 mg, 2-3 mg, Intravenous, Q2H PRN, [place] Rhyne, PA-C, 2 mg at 09/06/15 2130 .  ondansetron (ZOFRAN) injection 4 mg, 4 mg, Intravenous, Q6H PRN, Samantha J Rhyne, PA-C .  oxyCODONE (Oxy IR/ROXICODONE) immediate release tablet 5-10 mg, 5-10 mg, Oral, Q4H PRN, Samantha J Rhyne, PA-C, 10 mg at 09/10/15 1235 .  pantoprazole (PROTONIX) EC tablet 40 mg, 40 mg, Oral, Daily, Samantha J Rhyne, PA-C, 40 mg at 09/10/15 1050 .  phenol (CHLORASEPTIC) mouth spray 1 spray, 1 spray, Mouth/Throat, PRN, Samantha J Rhyne, PA-C .  vitamin C (ASCORBIC ACID) tablet 250 mg, 250 mg, Oral, Daily, Samantha J Rhyne, PA-C,  250 mg at 09/10/15 1050  Patients Current Diet: Diet Carb Modified Fluid consistency:: Thin; Room service appropriate?: Yes  Precautions / Restrictions Precautions Precautions: Fall, Other (comment) Precaution Comments: post op Rt BKA 09/06/15; Rt  knee to remain in extension and no pillow under knee Restrictions Weight Bearing Restrictions: Yes RLE Weight Bearing: Non weight bearing   Has the patient had 2 or more falls or a fall with injury in the past year?Yes, 2 falls with scrapes to hands and knees  Prior Activity Level Community (5-7x/wk): Pt. states she works for [employer] 3-5 days per week.  She takes the bus to work and out into the community.    Home Assistive Devices / Equipment Home Assistive Devices/Equipment: Eyeglasses  Prior Device Use: Indicate devices/aids used by the patient prior to current illness, exacerbation or injury? None of the above; used cane and RW PTA due to worsening foot pain.  Prior to onset of foot issues, pt. Was independent without device  Prior Functional Level Prior Function Level of Independence: Needs assistance Gait / Transfers Assistance Needed: Was using her dad's cane and RW ambulating shorter distances.  Needed assist w/ shower transfer (daughter assisted).   ADL's / Homemaking Assistance Needed: Needed assist from daughter w/ lower body dressing.  Mother and daughter was doing the cooking and cleaning as pt could not stand for longer periods of time. Comments: Needed assist w/ organizing her medicine at times as her vision occassionally becomes blurry which she attribuates to her diabetes.  Self Care: Did the patient need help bathing, dressing, using the toilet or eating?  Needed some help recently due to worsening of foot pain  Indoor Mobility: Did the patient need assistance with walking from room to room (with or without device)? Independent  Stairs: Did the patient need assistance with internal or external stairs (with or  without device)? Independent prior to onset of foot pain  Functional Cognition: Did the patient need help planning regular tasks such as shopping or remembering to take medications? Needed some help with pill box  at times due to visual issues  Current Functional Level Cognition  Overall Cognitive Status: Within Functional Limits for tasks assessed Orientation Level: Oriented X4    Extremity Assessment (includes Sensation/Coordination)  Upper Extremity Assessment: Overall WFL for tasks assessed  Lower Extremity Assessment: RLE deficits/detail RLE Deficits / Details: s/p Rt BKA w/ ace bandage    ADLs  Overall ADL's : Needs assistance/impaired Upper Body Bathing: Set up, Sitting Lower Body Bathing: Moderate assistance, Sit to/from stand Upper Body Dressing : Set up, Sitting Lower Body Dressing: Moderate assistance, Sit to/from stand Toilet Transfer: Minimal assistance, +2 for safety/equipment, Cueing for safety, Stand-pivot, Ambulation, BSC, RW Toileting- Clothing Manipulation and Hygiene: Minimal assistance, Sit to/from stand Functional mobility during ADLs: Minimal assistance, +2 for safety/equipment, Rolling walker General ADL Comments: +2 assist primarily for line management    Mobility  Overal bed mobility: Needs Assistance Bed Mobility: Supine to Sit, Sit to Supine Supine to sit: HOB elevated, Min guard Sit to supine: Min assist General bed mobility comments: pt used UE to bring R LE to EOB; cues for sequencing and increased time needed; assist to position R LE into bed     Transfers  Overall transfer level: Needs assistance Equipment used: Rolling walker (2 wheeled) Transfers: Sit to/from Stand Sit to Stand: Min assist, Min guard Stand pivot transfers: Min assist, +2 safety/equipment General transfer comment: assist to power up into stand from EOB and min guard for [facility] with cues for hand placement    Ambulation / Gait / Stairs / Wheelchair Mobility   Ambulation/Gait Ambulation/Gait assistance: Min assist Ambulation Distance (Feet): 22 Feet (11X2; seated break) Assistive device: Rolling walker (  2 wheeled) Gait Pattern/deviations: Step-to pattern General Gait Details: cues for sequencing of gait with use of AD and line management; pt with good safety awareness    Posture / Balance Dynamic Sitting Balance Sitting balance - Comments: Pt sat EOB ~10 minutes with min guard assist for safety Balance Overall balance assessment: Needs assistance Sitting-balance support: Feet supported, No upper extremity supported Sitting balance-Leahy Scale: Good Sitting balance - Comments: Pt sat EOB ~10 minutes with min guard assist for safety Standing balance support: Bilateral upper extremity supported Standing balance-Leahy Scale: Poor Standing balance comment: Reliant on UE support for balance    Special needs/care consideration BiPAP/CPAP    no CPM   no Continuous Drip IV   no Dialysis   no        Life Vest    no Oxygen   no Special Bed   no Trach Size   no Wound Vac (area) no      Skin    R residual limb wrapped in ace wrap appears clean and dry                               Bowel mgmt:    Last BM 09/06/15 PTA Bladder mgmt: using BSC with assist, continent Diabetic mgmt on PO meds at home PTA per pt.      Previous Home Environment Living Arrangements: Parent, Children Available Help at Discharge: Family, Available 24 hours/day Type of Home: House Home Layout: One level Home Access: Ramped entrance Bathroom Shower/Tub: Tub/shower unit, Engineer, building services: Standard Home Care Services: No Additional Comments: Pt's father has hx of BKA as well and has equipment, but needs it for himself.  Discharge Living Setting Plans for Discharge Living Setting: Other (Comment) (pt's mom and dad's home) Type of Home at Discharge: House Discharge Home Layout: One level Discharge Home Access: Ramped entrance Discharge Bathroom Shower/Tub:  Tub/shower unit Discharge Bathroom Toilet: Standard Discharge Bathroom Accessibility: Yes How Accessible: Accessible via walker Does the patient have any problems obtaining your medications?: No  Social/Family/Support Systems Patient Roles: Parent (daughters are ages 53 and 56) Anticipated Caregiver: mom, Careli Luzader [phone]) will be primary caregiver along with assist from pt's 29 year old daughter Diane.  Pt. also wanted to have her brother as a primary contact, though he works;  [employer] [phone]) Ability/Limitations of Caregiver: pt's dad is a bilateral amputee and in a wheelchair per pt; pt's mom uses a cane for longer distances but will assist pt as much as she is able,.  Pt's daughter Sedalia Muta will also be assisting. Caregiver Availability: 24/7 Discharge Plan Discussed with Primary Caregiver: Yes Is Caregiver In Agreement with Plan?: Yes Does Caregiver/Family have Issues with Lodging/Transportation while Pt is in Rehab?: No   Goals/Additional Needs Patient/Family Goal for Rehab: supervision and min assist PT/OT; n/a SLP Expected length of stay: 7-10 days Cultural Considerations: no Dietary Needs: carb modified with thin liquids Equipment Needs: TBA Pt/Family Agrees to Admission and willing to participate: Yes Program Orientation Provided & Reviewed with Pt/Caregiver Including Roles  & Responsibilities: Yes   Decrease burden of Care through IP rehab admission: n/a   Possible need for SNF placement upon discharge: not anticipated   Patient Condition: This patient's medical and functional status has changed since the consult dated: 09/07/15  in which the Rehabilitation Physician determined and documented that the patient's condition is appropriate for intensive rehabilitative care in an inpatient rehabilitation facility. See "History  of Present Illness" (above) for medical update. Functional changes are: as of 09/08/15, pt. Needed min and min guard assist for bed mobility  and transfers; min assist 11' x 2 with RW . Patient's medical and functional status update has been discussed with the Rehabilitation physician and patient remains appropriate for inpatient rehabilitation. Will admit to inpatient rehab today.  Preadmission Screen Completed By:  Weldon Picking, 09/10/2015 1:32 PM ______________________________________________________________________   Discussed status with Dr.  Allena Katz on 09/10/15 at  1521  and received telephone approval for admission today.  Admission Coordinator:  Weldon Picking, time 1610 /Date 09/10/15

## 2015-09-10 NOTE — Progress Notes (Signed)
Inpatient Rehabilitation  I was able to speak with pt's financial counselor Wallace CullensShavaughn Walker this am.  She stated she believes pt's family planning Medicaid will be able to convert to full Medicaid which would cover her IP rehab admission.  I explained this to patient and she would like to proceed with an IP Rehab admission.  I have spoken with Maris BergerKimberly Trinh,  PA who gives medical clearance to admit pt. today.  Pt's PCA was DC'd this am and I have discussed with pt and her RN Olegario MessierKathy that for the rehab process pt. needs to be on PO pain meds.  Pt. Is agreeable.  I will arrange for pt. To admit to CIR later this afternoon.  Please call if questions.  Weldon PickingSusan Irving Lubbers PT Inpatient Rehab Admissions Coordinator Cell [phone] Office [phone]

## 2015-09-10 NOTE — Progress Notes (Signed)
Spoke with patient regarding diabetes/insulin teaching.  She states she has been watching the nurses administer her insulin however she has not done it herself yet.  Patient states she is being transferred to rehab today.  Encouraged her to practice insulin administration. Also discussed the importance of glycemic control for wound healing.    Thanks, Beryl MeagerJenny Ehab Humber, RN, BC-ADM Inpatient Diabetes Coordinator Pager [phone]

## 2015-09-10 NOTE — Progress Notes (Signed)
09/10/2015 6:10 PM Pt transferred to [id number] via patient bed.  Tele monitor d/c'd, CCMD notified.  Pt's daughter is at bedside. Kathryne HitchAllen, Ellyse Rotolo C

## 2015-09-10 NOTE — Care Management Note (Signed)
Case Management Note Previous CM note initiated by Raynald BlendSamantha Claxton RN, CM  Patient Details  Name: Debra [name] MRN: [id number] Date of Birth: 02/03/1976  Subjective/Objective:     S/p BKA               Action/Plan:  PTA pt from home with family support.  CIR has been recommended - CSW consulted for SNF back up plan.  CM left physician sticky note and CM handoff stating that pt may need DME, HHRN and MATCH at discharge if going home.  CM provided [facility] information in case discharged over weekend. CM will continue to follow for discharge needs   Expected Discharge Date:    09/10/15              Expected Discharge Plan:  IP Rehab Facility  In-House Referral:  Clinical Social Work  Discharge planning Services  CM Consult  Post Acute Care Choice:  NA Choice offered to:  NA  DME Arranged:    DME Agency:     HH Arranged:    HH Agency:     Status of Service:  Completed, signed off  If discussed at [employer] of [employer] Meetings, dates discussed:    Additional Comments:  09/10/15- 1230- Donn PieriniKristi Petronella Shuford RN, BSN- per [facility] note - plan to admit pt to CIR- no further CM or CSW needs.   Zenda AlpersWebster, PeggsKristi Hall, RN 09/10/2015, 12:32 PM [phone]

## 2015-09-10 NOTE — H&P (Signed)
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 female with history of T2DM, HTN, ongoing tobacco use, coolness bilateral feet with evidence of PAD who developed progressive gangrenous changes right foot with rest pain and ischemia. She failed conservative treatment. 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 Edilia Bo. She has associated phantom limb pain. Post op therapy evaluations recommending CIR.   Review of Systems  HENT: Negative for hearing loss.   Eyes: Negative for blurred vision and double vision.  Respiratory: Negative for cough and shortness of breath.   Cardiovascular: Negative for chest pain and palpitations.  Gastrointestinal: Negative for heartburn, nausea and constipation.  Genitourinary: Negative for dysuria and urgency.  Musculoskeletal: Positive for back pain. Negative for myalgias.  Skin: Negative for itching and rash.  Neurological: Positive for dizziness, sensory change and weakness. Negative for headaches.  Psychiatric/Behavioral: The patient is nervous/anxious and has insomnia.   All other systems reviewed and are negative.   Past Medical History  Diagnosis Date  . Diabetes mellitus without complication (HCC)   . Back pain   . Abscess   . Hyperlipidemia   . Hypertension   . Peripheral vascular disease (HCC)   . Bronchitis   . Pneumonia   . Goiter   . Anxiety   . Depression   . GERD (gastroesophageal reflux disease)   . Headache     migraines    Past Surgical History  Procedure Laterality Date  . Peripheral vascular catheterization N/A 05/28/2015    Procedure: Abdominal Aortogram;  Surgeon: Chuck Hint, MD;  Location: [facility] INVASIVE CV LAB;  Service: Cardiovascular;  Laterality: N/A;  . 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 Amputation Right 09/06/2015    Procedure: AMPUTATION BELOW KNEE;  Surgeon: Chuck Hint, MD;  Location: [facility] OR;  Service:  Vascular;  Laterality: Right;    Family History  Problem Relation Age of Onset  . Diabetes Mother   . Hypertension Mother   . Hyperlipidemia Mother   . Diabetes Father   . Heart disease Father     Social History:  Lives alone--plans on moving in with parents who have ramp at entry and mother in good health. Mother and daughter can assist after discharge? Has been unemployed for past 3-4 years. She reports that she has been smoking one pack /2-3 days. Her smoking use included Cigarettes. She has a 5 pack-year smoking history. She has never used smokeless tobacco. She reports that she does not drink alcohol or use illicit drugs.    Allergies  Allergen Reactions  . Ibuprofen Other (See Comments)    Upset stomach    Medications Prior to Admission  Medication Sig Dispense Refill  . aspirin 325 MG tablet Take 1 tablet (325 mg total) by mouth daily. 30 tablet 1  . atorvastatin (LIPITOR) 20 MG tablet Take 20 mg by mouth daily.    . cephALEXin (KEFLEX) 500 MG capsule Take 1 capsule (500 mg total) by mouth 3 (three) times daily. 42 capsule 2  . cholecalciferol (VITAMIN D) 1000 units tablet Take 1,000 Units by mouth daily.    Marland Kitchen glimepiride (AMARYL) 2 MG tablet Take 2 mg by mouth daily with breakfast.    . glipiZIDE (GLUCOTROL) 10 MG tablet Take 1 tablet (10 mg total) by mouth 2 (two) times daily before a meal. (Patient taking differently: Take 5 mg by mouth 2 (two) times daily before a  meal. ) 60 tablet 3  . lisinopril (PRINIVIL,ZESTRIL) 5 MG tablet Take 1 tablet (5 mg total) by mouth daily. (Patient taking differently: Take 10 mg by mouth daily. ) 30 tablet 3  . metFORMIN (GLUCOPHAGE) 500 MG tablet Take 500 mg by mouth 2 (two) times daily with a meal.     . nitroGLYCERIN (NITRODUR - DOSED IN MG/24 HR) 0.4 mg/hr patch Apply to right foot near big toe daily 30 patch 0  . oxyCODONE (OXY IR/ROXICODONE) 5 MG immediate release tablet Take 1 tablet (5 mg total) by mouth every 6 (six) hours as needed  for severe pain. 30 tablet 0  . vitamin C (ASCORBIC ACID) 250 MG tablet Take 250 mg by mouth daily.    . cephALEXin (KEFLEX) 500 MG capsule Take 1 capsule (500 mg total) by mouth 3 (three) times daily. (Patient not taking: Reported on 08/30/2015) 42 capsule 1    Home: Home Living Family/patient expects to be discharged to:: Private residence Living Arrangements: Parent, Children Available Help at Discharge: Family, Available 24 hours/day Type of Home: House Home Access: Ramped entrance Home Layout: One level Bathroom Shower/Tub: Tub/shower unit, Engineer, building services: Standard Additional Comments: Pt's father has hx of BKA as well and has equipment, but needs it for himself.   Functional History: Prior Function Level of Independence: Needs assistance Gait / Transfers Assistance Needed: Was using her dad's cane and RW ambulating shorter distances.  Needed assist w/ shower transfer (daughter assisted).   ADL's / Homemaking Assistance Needed: Needed assist from daughter w/ lower body dressing.  Mother and daughter was doing the cooking and cleaning as pt could not stand for longer periods of time. Comments: Needed assist w/ organizing her medicine at times as her vision occassionally becomes blurry which she attribuates to her diabetes.  Functional Status:  Mobility: Bed Mobility Overal bed mobility: Needs Assistance Bed Mobility: Supine to Sit, Sit to Supine Supine to sit: HOB elevated, Min guard Sit to supine: Min assist General bed mobility comments: pt used UE to bring R LE to EOB; cues for sequencing and increased time needed; assist to position R LE into bed  Transfers Overall transfer level: Needs assistance Equipment used: Rolling walker (2 wheeled) Transfers: Sit to/from Stand Sit to Stand: Min assist, Min guard Stand pivot transfers: Min assist, +2 safety/equipment General transfer comment: assist to power up into stand from EOB and min guard for [facility] with cues for hand  placement Ambulation/Gait Ambulation/Gait assistance: Min assist Ambulation Distance (Feet): 22 Feet (11X2; seated break) Assistive device: Rolling walker (2 wheeled) Gait Pattern/deviations: Step-to pattern General Gait Details: cues for sequencing of gait with use of AD and line management; pt with good safety awareness    ADL: ADL Overall ADL's : Needs assistance/impaired Upper Body Bathing: Set up, Sitting Lower Body Bathing: Moderate assistance, Sit to/from stand Upper Body Dressing : Set up, Sitting Lower Body Dressing: Moderate assistance, Sit to/from stand Toilet Transfer: Minimal assistance, +2 for safety/equipment, Cueing for safety, Stand-pivot, Ambulation, BSC, RW Toileting- Clothing Manipulation and Hygiene: Minimal assistance, Sit to/from stand Functional mobility during ADLs: Minimal assistance, +2 for safety/equipment, Rolling walker General ADL Comments: +2 assist primarily for line management  Cognition: Cognition Overall Cognitive Status: Within Functional Limits for tasks assessed Orientation Level: Oriented X4 Cognition Arousal/Alertness: Awake/alert Behavior During Therapy: WFL for tasks assessed/performed Overall Cognitive Status: Within Functional Limits for tasks assessed  Physical Exam: Blood pressure 126/74, pulse 91, temperature 98.4 F (36.9 C), temperature source Oral, resp. rate  15, height 5\' 10"  (1.778 m), weight 116.121 kg (256 lb), last menstrual period 08/22/2015, SpO2 95 %. Physical Exam  Nursing note and vitals reviewed. Constitutional: She is oriented to person, place, and time. She appears well-developed and well-nourished.  HENT:  Head: Normocephalic and atraumatic.  Mouth/Throat: Oropharynx is clear and moist.  Eyes: Conjunctivae and EOM are normal. Pupils are equal, round, and reactive to light.  Neck: Normal range of motion. Neck supple.  Cardiovascular: Normal rate and regular rhythm.   Respiratory: Effort normal and breath sounds  normal. No respiratory distress. She has no wheezes.  GI: Soft. Bowel sounds are normal. She exhibits no distension. There is no tenderness.  Musculoskeletal: She exhibits edema and tenderness.  1+ edema LLE. R-BKA with moderate edema--staples intact and incision clean and dry without erythema. Tender to touch and had difficulty fully extending  at knee without pain.   Neurological: She is alert and oriented to person, place, and time.  Able to follow simple motor commands without difficulty.  Motor: B/l UE, LLE: 5/5 proximal to distal RLE: 3/5 hip flexion (pain inhibition)   Skin: Skin is warm and dry.  See Musc  Psychiatric: Her speech is normal. Thought content normal. Her mood appears anxious. She is slowed. Cognition and memory are normal.    Results for orders placed or performed during the hospital encounter of 09/06/15 (from the past 48 hour(s))  Glucose, capillary     Status: Abnormal   Collection Time: 09/08/15  4:07 PM  Result Value Ref Range   Glucose-Capillary 218 (H) 65 - 99 mg/dL  Glucose, capillary     Status: None   Collection Time: 09/08/15  9:14 PM  Result Value Ref Range   Glucose-Capillary 91 65 - 99 mg/dL  Glucose, capillary     Status: Abnormal   Collection Time: 09/09/15  6:49 AM  Result Value Ref Range   Glucose-Capillary 210 (H) 65 - 99 mg/dL  Glucose, capillary     Status: Abnormal   Collection Time: 09/09/15 11:32 AM  Result Value Ref Range   Glucose-Capillary 201 (H) 65 - 99 mg/dL  Glucose, capillary     Status: Abnormal   Collection Time: 09/09/15  4:12 PM  Result Value Ref Range   Glucose-Capillary 127 (H) 65 - 99 mg/dL  Glucose, capillary     Status: Abnormal   Collection Time: 09/09/15  8:29 PM  Result Value Ref Range   Glucose-Capillary 152 (H) 65 - 99 mg/dL  Glucose, capillary     Status: Abnormal   Collection Time: 09/10/15  6:03 AM  Result Value Ref Range   Glucose-Capillary 169 (H) 65 - 99 mg/dL  Glucose, capillary     Status: Abnormal     Collection Time: 09/10/15 11:44 AM  Result Value Ref Range   Glucose-Capillary 146 (H) 65 - 99 mg/dL   No results found.     Medical Problem List and Plan: 1.  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 2.  DVT Prophylaxis/Anticoagulation: Pharmaceutical: Lovenox 3. Pain Management: Oxycodone prn. Will add low dose neurontin for neuropathy and re-educated on need to limit narcotics.  4. Mood: LCSW to follow for evaluation and support.  5. Neuropsych: This patient is capable of making decisions on her own behalf. 6. Skin/Wound Care: Monitor wound daily for healing. Maintain adequate nutrition and hydration status.  7. Fluids/Electrolytes/Nutrition: Monitor I/O. Check lytes in am.  8. HTN:  Will monitor BP bid. Continue lisinopril daily.  9.T2DM: Poorly controlled with A1c-12.1. Has  been on medications for past 6 years and reports compliance. Change glucotrol to amaryl as at home. Continue metformin bid. Was started on Lantus per diabetes coordinator--patient's parents diabetics and she is agreeable.  10. GERD: At times due to intake--will change Protonix to Pepcid.  11. PAD: Cont meds 12. Hyponatremia: Follow labs 13. Morbid Obesity: Body mass index is 36.73 kg/(m^2). Diet and exercise education.  Encourage weight loss to increase endurance and promote overall health 14. Tobacco abuse: Counsel   Post Admission Physician Evaluation: 1. Functional deficits secondary  to right BKA. 2. Patient is admitted to receive collaborative, interdisciplinary care between the physiatrist, rehab nursing staff, and therapy team. 3. Patient's level of medical complexity and substantial therapy needs in context of that medical necessity cannot be provided at a lesser intensity of care such as a SNF. 4. Patient has experienced substantial functional loss from his/her baseline which was documented above under the "Functional History" and "Functional Status" headings.  Judging by the patient's diagnosis,  physical exam, and functional history, the patient has potential for functional progress which will result in measurable gains while on inpatient rehab.  These gains will be of substantial and practical use upon discharge  in facilitating mobility and self-care at the household level. 5. Physiatrist will provide 24 hour management of medical needs as well as oversight of the therapy plan/treatment and provide guidance as appropriate regarding the interaction of the two. 6. 24 hour rehab nursing will assist with bladder management, safety, skin/wound care, disease management, medication administration, pain management and patient education and help integrate therapy concepts, techniques,education, etc. 7. PT will assess and treat for/with: Lower extremity strength, range of motion, stamina, balance, functional mobility, safety, adaptive techniques and equipment, woundcare, coping skills, pain control, education.   Goals are: Mod I at wheelchair level. 8. OT will assess and treat for/with: ADL's, functional mobility, safety, upper extremity strength, adaptive techniques and equipment, wound mgt, ego support, and community reintegration.   Goals are: Mod I at wheelchair level. Therapy may not proceed with showering this patient. 9. Case Management and Social Worker will assess and treat for psychological issues and discharge planning. 10. Team conference will be held weekly to assess progress toward goals and to determine barriers to discharge. 11. Patient will receive at least 3 hours of therapy per day at least 5 days per week. 12. ELOS: 7-10 days.       13. Prognosis:  good   Maryla Morrow, MD 09/10/2015

## 2015-09-10 NOTE — Progress Notes (Signed)
Physical Therapy Treatment Patient Details Name: Debra [name] MRN: [id number] DOB: 10/01/1975 Today's Date: 09/10/2015    History of Present Illness Pt is a 40 y/o F s/p Rt BKA.  Pt was seen in the ED on 05/24/15 with cold feet bilaterally w/ evidence of infrainguinal arterial occlusive disease.  According to her arteriogram there were no option for revascularization and no obvious source for embolization, pt w/ gangrenous Rt great toe, and pt's Rt BKA was completed on 09/06/15.  Pt's additional PMH includes back pain.    PT Comments    Pt making good progress with mobility.  Verbalizes understanding on importance of positioning in preparation for future prosthesis.  Pt is excellent CIR candidate.  Follow Up Recommendations  CIR;Supervision for mobility/OOB     Equipment Recommendations  Rolling walker with 5" wheels;3in1 (PT);Wheelchair (measurements PT);Wheelchair cushion (measurements PT)    Recommendations for Other Services       Precautions / Restrictions Precautions Precautions: Fall;Other (comment) Precaution Comments: post op Rt BKA 09/06/15; Rt knee to remain in extension and no pillow under knee Restrictions Weight Bearing Restrictions: Yes RLE Weight Bearing: Non weight bearing    Mobility  Bed Mobility Overal bed mobility: Needs Assistance Bed Mobility: Supine to Sit     Supine to sit: HOB elevated;Min guard     General bed mobility comments: Pt able to use UE to bring R LE to EOB. Moves slowly  Transfers Overall transfer level: Needs assistance Equipment used: Rolling walker (2 wheeled) Transfers: Sit to/from Stand Sit to Stand: Min guard         General transfer comment: Pt able to power up without physical A. Min/guard for safety and pt c/o throbbing R LE  Ambulation/Gait Ambulation/Gait assistance: Min guard Ambulation Distance (Feet): 55 Feet Assistive device: Rolling walker (2 wheeled) Gait Pattern/deviations: Step-to pattern     General  Gait Details: Pt with good use of RW.  Pt educated on purpose of RW vs. crutches   Stairs            Wheelchair Mobility    Modified Rankin (Stroke Patients Only)       Balance     Sitting balance-Leahy Scale: Good     Standing balance support: Bilateral upper extremity supported Standing balance-Leahy Scale: Poor                      Cognition Arousal/Alertness: Awake/alert Behavior During Therapy: WFL for tasks assessed/performed Overall Cognitive Status: Within Functional Limits for tasks assessed                      Exercises Amputee Exercises Quad Sets: Strengthening;Right;Supine Gluteal Sets: Strengthening;10 reps Towel Squeeze: 5 reps;Strengthening (difficulty with this exercise)    General Comments General comments (skin integrity, edema, etc.): Reviewed importance of touching residual limb.  Educated on positioning to prevent knee and hip contractures.      Pertinent Vitals/Pain Pain Assessment: 0-10 Pain Score: 7  Pain Location: R residual limb Pain Descriptors / Indicators: Operative site guarding;Sore;Heaviness;Throbbing Pain Intervention(s): Limited activity within patient's tolerance;Monitored during session;Premedicated before session;Repositioned    Home Living                      Prior Function            PT Goals (current goals can now be found in the care plan section) Acute Rehab PT Goals Patient Stated Goal: regain independence PT Goal Formulation: With  patient Time For Goal Achievement: 09/20/15 Potential to Achieve Goals: Good Progress towards PT goals: Progressing toward goals    Frequency  Min 4X/week    PT Plan Current plan remains appropriate    Co-evaluation             End of Session Equipment Utilized During Treatment: Gait belt Activity Tolerance: Patient tolerated treatment well Patient left: with call bell/phone within reach;with family/visitor present;Other (comment) (CIR  admissions coordinator)     Time: 4098-1191 PT Time Calculation (min) (ACUTE ONLY): 26 min  Charges:  $Gait Training: 8-22 mins $Therapeutic Exercise: 8-22 mins                    G Codes:      Debra [name] 09/10/2015, 1:37 PM

## 2015-09-10 NOTE — Progress Notes (Signed)
[facility] Admission Coordinator Signed Physical Medicine and Rehabilitation PMR Pre-admission 09/10/2015 12:40 PM  Related encounter: Admission (Current) from 09/06/2015 in [facility] 2W CARDIAC UNIT    Expand All Collapse All   PMR Admission Coordinator Pre-Admission Assessment  Patient: Debra [name] is an 40 y.o., female MRN: [id number] DOB: February 03, 1976 Height: 5\' 10"  (177.8 cm) Weight: 116.121 kg (256 lb)  Insurance Information HMO: PPO: PCP: IPA: 80/20: OTHER:  PRIMARY: Medicaid (for family planning services only at this time). Spoke with financial counselor Wallace Cullens who stated she is working to get pt's Medicaid switched to full Medicaid. Pt. understands that though this is likely per financial counselor, this is not a guarantee, and she wishes to proceed with IP Rehab admission.  Policy#: [id number] m Subscriber: self CM Name: Phone#: Fax#:  Pre-Cert#: eligibility code [id number] effective as Of 01/31/16 Employer: Benefits: Phone #: [phone] Name:  Eff. Date: Effective as of 01/31/15 Deduct: Out of Pocket Max: Life Max:  CIR: SNF:  Outpatient: Co-Pay:  Home Health: Co-Pay:  DME: Co-Pay:  Providers:  SECONDARY: Policy#: Subscriber:  CM Name: Phone#: Fax#:  Pre-Cert#: Employer:  Benefits: Phone #: Name:  Eff. 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 female with history of T2DM, HTN, ongoing tobacco use, coolness bilateral feet with evidence of PAD who developed progressive gangrenous changes right foot with rest pain and ischemia. She failed conservative treatment. 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 Edilia Bo. Post op therapy evaluations recommending IP Rehab. Pt. Was admitted to IP Rehab on 09/10/15.      Past Medical History  Past Medical History  Diagnosis Date  . Diabetes mellitus without complication (HCC)   . Back pain   . Abscess   . Hyperlipidemia   . Hypertension   . Peripheral vascular disease (HCC)   . Bronchitis   . Pneumonia   . Goiter   . Anxiety   . Depression   . GERD (gastroesophageal reflux disease)   . Headache     migraines    Family History  family history includes Diabetes in her father and mother; Heart disease in her father; Hyperlipidemia in her mother; Hypertension in her mother.  Prior Rehab/Hospitalizations:  Has the patient had major surgery during 100 days prior to admission? No  Current Medications   Current facility-administered medications:  . 0.9 % sodium chloride infusion, , Intravenous, Continuous, Samantha J Rhyne, PA-C, Last Rate: 100 mL/hr at 09/06/15 1257 . acetaminophen (TYLENOL) tablet 325-650 mg, 325-650 mg, Oral, Q4H PRN **OR** acetaminophen (TYLENOL) suppository 325-650 mg, 325-650 mg, Rectal, Q4H PRN, Samantha J Rhyne, PA-C . alum & mag hydroxide-simeth (MAALOX/MYLANTA) 200-200-20 MG/5ML suspension 15-30 mL, 15-30 mL, Oral, Q2H PRN, Samantha J Rhyne, PA-C . aspirin tablet 325 mg, 325 mg, Oral, Daily, Samantha J Rhyne, PA-C, 325 mg at 09/10/15 1050 . atorvastatin (LIPITOR) tablet 20 mg, 20 mg, Oral, Daily, Samantha J Rhyne,  PA-C, 20 mg at 09/10/15 1050 . bisacodyl (DULCOLAX) suppository 10 mg, 10 mg, Rectal, Daily PRN, Samantha J Rhyne, PA-C . cholecalciferol (VITAMIN  D) tablet 1,000 Units, 1,000 Units, Oral, Daily, Samantha J Rhyne, PA-C, 1,000 Units at 09/10/15 1050 . docusate sodium (COLACE) capsule 100 mg, 100 mg, Oral, Daily, Samantha J Rhyne, PA-C, 100 mg at 09/10/15 1051 . enoxaparin (LOVENOX) injection 30 mg, 30 mg, Subcutaneous, Q24H, Samantha J Rhyne, PA-C, 30 mg at 09/09/15 1152 . glipiZIDE (GLUCOTROL) tablet 5 mg, 5 mg, Oral, BID AC, Samantha J Rhyne, PA-C, 5 mg at 09/10/15 0727 . guaiFENesin-dextromethorphan (ROBITUSSIN DM) 100-10 MG/5ML syrup 15 mL, 15 mL, Oral, Q4H PRN, Samantha J Rhyne, PA-C . hydrALAZINE (APRESOLINE) injection 5 mg, 5 mg, Intravenous, Q20 Min PRN, Samantha J Rhyne, PA-C . insulin aspart (novoLOG) injection 0-15 Units, 0-15 Units, Subcutaneous, TID WC, Samantha J Rhyne, PA-C, 2 Units at 09/10/15 0727 . insulin aspart (novoLOG) injection 4 Units, 4 Units, Subcutaneous, TID WC, Chuck Hint, MD, 4 Units at 09/10/15 [phone] . insulin glargine (LANTUS) injection 25 Units, 25 Units, Subcutaneous, Daily, Chuck Hint, MD, 25 Units at 09/10/15 1051 . labetalol (NORMODYNE,TRANDATE) injection 10 mg, 10 mg, Intravenous, Q10 min PRN, Samantha J Rhyne, PA-C . lisinopril (PRINIVIL,ZESTRIL) tablet 10 mg, 10 mg, Oral, Daily, Samantha J Rhyne, PA-C, 10 mg at 09/10/15 1051 . magnesium hydroxide (MILK OF MAGNESIA) suspension 30 mL, 30 mL, Oral, Daily PRN, Samantha J Rhyne, PA-C, 30 mL at 09/10/15 1049 . metFORMIN (GLUCOPHAGE) tablet 500 mg, 500 mg, Oral, BID WC, Samantha J Rhyne, PA-C, 500 mg at 09/10/15 0727 . metoprolol (LOPRESSOR) injection 2-5 mg, 2-5 mg, Intravenous, Q2H PRN, Samantha J Rhyne, PA-C . morphine 2 MG/ML injection 2-3 mg, 2-3 mg, Intravenous, Q2H PRN, [place]es Coupe Rhyne, PA-C, 2 mg at 09/06/15 2130 . ondansetron (ZOFRAN) injection 4 mg, 4 mg, Intravenous,  Q6H PRN, Samantha J Rhyne, PA-C . oxyCODONE (Oxy IR/ROXICODONE) immediate release tablet 5-10 mg, 5-10 mg, Oral, Q4H PRN, Samantha J Rhyne, PA-C, 10 mg at 09/10/15 1235 . pantoprazole (PROTONIX) EC tablet 40 mg, 40 mg, Oral, Daily, Samantha J Rhyne, PA-C, 40 mg at 09/10/15 1050 . phenol (CHLORASEPTIC) mouth spray 1 spray, 1 spray, Mouth/Throat, PRN, Samantha J Rhyne, PA-C . vitamin C (ASCORBIC ACID) tablet 250 mg, 250 mg, Oral, Daily, Samantha J Rhyne, PA-C, 250 mg at 09/10/15 1050  Patients Current Diet: Diet Carb Modified Fluid consistency:: Thin; Room service appropriate?: Yes  Precautions / Restrictions Precautions Precautions: Fall, Other (comment) Precaution Comments: post op Rt BKA 09/06/15; Rt knee to remain in extension and no pillow under knee Restrictions Weight Bearing Restrictions: Yes RLE Weight Bearing: Non weight bearing   Has the patient had 2 or more falls or a fall with injury in the past year?Yes, 2 falls with scrapes to hands and knees  Prior Activity Level Community (5-7x/wk): Pt. states she works for [employer]" 3-5 days per week. She takes the bus to work and out into the community.   Home Assistive Devices / Equipment Home Assistive Devices/Equipment: Eyeglasses  Prior Device Use: Indicate devices/aids used by the patient prior to current illness, exacerbation or injury? None of the above; used cane and RW PTA due to worsening foot pain. Prior to onset of foot issues, pt. Was independent without device  Prior Functional Level Prior Function Level of Independence: Needs assistance Gait / Transfers Assistance Needed: Was using her dad's cane and RW ambulating shorter distances. Needed assist w/ shower transfer (daughter assisted).  ADL's / Homemaking Assistance Needed: Needed assist from daughter w/ lower body dressing. Mother and daughter was doing the cooking and cleaning as pt could  not stand for longer periods of time. Comments: Needed  assist w/ organizing her medicine at times as her vision occassionally becomes blurry which she attribuates to her diabetes.  Self Care: Did the patient need help bathing, dressing, using the toilet or eating? Needed some help recently due to worsening of foot pain  Indoor Mobility: Did the patient need assistance with walking from room to room (with or without device)? Independent  Stairs: Did the patient need assistance with internal or external stairs (with or without device)? Independent prior to onset of foot pain  Functional Cognition: Did the patient need help planning regular tasks such as shopping or remembering to take medications? Needed some help with pill box at times due to visual issues  Current Functional Level Cognition  Overall Cognitive Status: Within Functional Limits for tasks assessed Orientation Level: Oriented X4   Extremity Assessment (includes Sensation/Coordination)  Upper Extremity Assessment: Overall WFL for tasks assessed  Lower Extremity Assessment: RLE deficits/detail RLE Deficits / Details: s/p Rt BKA w/ ace bandage    ADLs  Overall ADL's : Needs assistance/impaired Upper Body Bathing: Set up, Sitting Lower Body Bathing: Moderate assistance, Sit to/from stand Upper Body Dressing : Set up, Sitting Lower Body Dressing: Moderate assistance, Sit to/from stand Toilet Transfer: Minimal assistance, +2 for safety/equipment, Cueing for safety, Stand-pivot, Ambulation, BSC, RW Toileting- Clothing Manipulation and Hygiene: Minimal assistance, Sit to/from stand Functional mobility during ADLs: Minimal assistance, +2 for safety/equipment, Rolling walker General ADL Comments: +2 assist primarily for line management    Mobility  Overal bed mobility: Needs Assistance Bed Mobility: Supine to Sit, Sit to Supine Supine to sit: HOB elevated, Min guard Sit to supine: Min assist General bed mobility comments: pt used UE to bring R LE to EOB; cues for  sequencing and increased time needed; assist to position R LE into bed     Transfers  Overall transfer level: Needs assistance Equipment used: Rolling walker (2 wheeled) Transfers: Sit to/from Stand Sit to Stand: Min assist, Min guard Stand pivot transfers: Min assist, +2 safety/equipment General transfer comment: assist to power up into stand from EOB and min guard for [facility] with cues for hand placement    Ambulation / Gait / Stairs / Wheelchair Mobility  Ambulation/Gait Ambulation/Gait assistance: Min assist Ambulation Distance (Feet): 22 Feet (11X2; seated break) Assistive device: Rolling walker (2 wheeled) Gait Pattern/deviations: Step-to pattern General Gait Details: cues for sequencing of gait with use of AD and line management; pt with good safety awareness    Posture / Balance Dynamic Sitting Balance Sitting balance - Comments: Pt sat EOB ~10 minutes with min guard assist for safety Balance Overall balance assessment: Needs assistance Sitting-balance support: Feet supported, No upper extremity supported Sitting balance-Leahy Scale: Good Sitting balance - Comments: Pt sat EOB ~10 minutes with min guard assist for safety Standing balance support: Bilateral upper extremity supported Standing balance-Leahy Scale: Poor Standing balance comment: Reliant on UE support for balance    Special needs/care consideration BiPAP/CPAP no CPM no Continuous Drip IV no Dialysis no  Life Vest no Oxygen no Special Bed no Trach Size no Wound Vac (area) no  Skin R residual limb wrapped in ace wrap appears clean and dry  Bowel mgmt: Last BM 09/06/15 PTA Bladder mgmt: using BSC with assist, continent Diabetic mgmt on PO meds at home PTA per pt.      Previous Home Environment Living Arrangements: Parent, Children Available Help at Discharge: Family, Available 24 hours/day Type of Home: House Home  Layout: One  level Home Access: Ramped entrance Bathroom Shower/Tub: Tub/shower unit, Engineer, building servicesCurtain Bathroom Toilet: Standard Home Care Services: No Additional Comments: Pt's father has hx of BKA as well and has equipment, but needs it for himself.  Discharge Living Setting Plans for Discharge Living Setting: Other (Comment) (pt's mom and dad's home) Type of Home at Discharge: House Discharge Home Layout: One level Discharge Home Access: Ramped entrance Discharge Bathroom Shower/Tub: Tub/shower unit Discharge Bathroom Toilet: Standard Discharge Bathroom Accessibility: Yes How Accessible: Accessible via walker Does the patient have any problems obtaining your medications?: No  Social/Family/Support Systems Patient Roles: Parent (daughters are ages 5115 and 4611) Anticipated Caregiver: mom, Judeth PorchLinda Gasiorowski [phone]) will be primary caregiver along with assist from pt's 39 year old daughter Diane. Pt. also wanted to have her brother as a primary contact, though he works; [employer] Micro[employer]Mike Brensinger [phone]) Ability/Limitations of Caregiver: pt's dad is a bilateral amputee and in a wheelchair per pt; pt's mom uses a cane for longer distances but will assist pt as much as she is able,. Pt's daughter Sedalia MutaDiane will also be assisting. Caregiver Availability: 24/7 Discharge Plan Discussed with Primary Caregiver: Yes Is Caregiver In Agreement with Plan?: Yes Does Caregiver/Family have Issues with Lodging/Transportation while Pt is in Rehab?: No   Goals/Additional Needs Patient/Family Goal for Rehab: supervision and min assist PT/OT; n/a SLP Expected length of stay: 7-10 days Cultural Considerations: no Dietary Needs: carb modified with thin liquids Equipment Needs: TBA Pt/Family Agrees to Admission and willing to participate: Yes Program Orientation Provided & Reviewed with Pt/Caregiver Including Roles & Responsibilities: Yes   Decrease burden of Care through IP rehab admission: n/a   Possible need for SNF  placement upon discharge: not anticipated   Patient Condition: This patient's medical and functional status has changed since the consult dated: 09/07/15 in which the Rehabilitation Physician determined and documented that the patient's condition is appropriate for intensive rehabilitative care in an inpatient rehabilitation facility. See "History of Present Illness" (above) for medical update. Functional changes are: as of 09/08/15, pt. Needed min and min guard assist for bed mobility and transfers; min assist 11' x 2 with RW . Patient's medical and functional status update has been discussed with the Rehabilitation physician and patient remains appropriate for inpatient rehabilitation. Will admit to inpatient rehab today.  Preadmission Screen Completed By: Weldon PickingBLANKENSHIP, Jaynie Hitch, 09/10/2015 1:32 PM ______________________________________________________________________  Discussed status with Dr. Allena KatzPatel on 09/10/15 at 1521 and received telephone approval for admission today.  Admission Coordinator: Weldon PickingBLANKENSHIP, Lc Joynt, time [id number] /Date 09/10/15          Cosigned by: Ankit Karis JubaAnil Patel, MD at 09/10/2015 3:22 PM  Revision History     Date/Time User Provider Type Action   09/10/2015 3:22 PM Ankit Karis JubaAnil Patel, MD Physician Cosign   09/10/2015 3:22 PM Weldon PickingBLANKENSHIP, Daisy Lites [facility] Admission Coordinator Sign

## 2015-09-10 NOTE — Discharge Summary (Signed)
[facility] Discharge Summary  Debra AgarStephanie [name] 12/28/1975 40 y.o. female  [id number]  Admission Date: 09/06/2015  Discharge Date: 09/10/2015  Physician: Chuck Hinthristopher S Dickson, MD  Admission Diagnosis: Right lower extremity ischemia M62.89; Gangrene right great toe I96  HPI:   This is a 40 y.o. female patient that Dr. Edilia Boickson saw in consultation in the emergency department on 05/24/2015 with cold feet bilaterally. She had evidence of infrainguinal arterial occlusive disease. She was therefore set her up for an arteriogram. She returns after phone call from pt today. Reported she has worsening sx's of right 1st toe. Stated the right 1st toe had been red, and now has turned black. Reported there is an open crack from the toenail and down to the base of the toe. Reported there is "clear drainage." Stated she is in constant pain. Reported she has "some swelling in the right foot, and stated the ankle feels like it is broken." Stated she is in constant pain, and if she has to have an amputation, she is prepared for that. She is unable to sleep due to severe right foot rest pain. Since she saw Dr. Edilia Boickson, the rest pain has advanced from her right foot to her right calf. The black in her right great toe has advanced from the tip of the toe to the base of the toe.  She underwent an arteriogram on 05/28/2015. On the right side, which is the more symptomatic side, there was a mild 20-30% stenosis of the right common iliac artery that was fairly smooth. The external iliac artery on the right and hypogastric arteries were patent. The right common femoral, superficial femoral, deep femoral, and popliteal arteries were patent. The anterior tibial artery was occluded in the distal third of the leg. The peroneal artery occluded in the mid calf. The posterior tibial artery was not visualized. On the left side, the common iliac, external iliac, and hypogastric arteries were patent.  The left common femoral, deep femoral, superficial femoral, popliteal, anterior tibial and peroneal arteries were patent. The posterior tibial artery was occluded.  According to her arteriogram there were no options for revascularization and no obvious source for embolization.   Dr. Edilia Boickson last saw pt on 08/17/15. At that time he discussed with pt the importance of tobacco cessation and Dr. Edilia Boickson encouraged her to get on a structured walking program. She continued to smoke 2 cigarettes a day. She had been soaking the foot in hot water and Dr. Edilia Boickson was concerned that given her diabetes water may have been too hot. Unfortunately the wound looked slightly worse which may be from soaking the foot in water that was too warm. 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 She had some mild cellulitis proximal to this. Dr. Edilia Boickson restarted her on Keflex at that time. Dr. Edilia Boickson and pt had a very long discussion about the importance of tobacco cessation and how nicotine causes vasospasm in the microcirculation. It is critically important that she get off tobacco completely to improve her chances of healing the right great toe wound. She was also having significant pain at night and Dr. Edilia Boickson wrote her a prescription for Tylenol 3 #30. Dr. Edilia Boickson discussed with her the importance of trying to get off all narcotics as this is typically not a good long-term solution and can be addicting. We'll plan on seeing her back in 4 weeks. She knows to call sooner if she has problems.  Pt states her  last menstrual period started on 08/22/15. She does not know that she has a large goiter, denies dysphagia or dyspnea, states she has not had any treatment to her goiter.   Pt Diabetic: Yes, 11. 2 A1C in September 2016 (review of records), uncontrolled. Pt states she rarely checks her blood sugar as it "scares me", when she checks it is in the 300's Pt smoker: smoker (1-2  cigarettes/day)  Hospital Course:  The patient was admitted to the hospital and taken to the operating room on 09/06/2015 and underwent: right below the knee amputation.  The patient tolerated the procedure well and was transported to the PACU in stable condition.   The patient required a PCA for pain. Her dressing was taken down on POD 2. The incision was healing well with minimal blood drainage on the bandage. She continued to need the PCA for pain relief. Physical therapy recommended CIR.   Her PCA was discontinued on POD 4. Her pain was well controlled on oral pain medications. She was weaning off of oxygen. She was discharged to CIR in good condition.     CBC    Component Value Date/Time   WBC 10.3 09/08/2015 0233   RBC 4.19 09/08/2015 0233   HGB 12.7 09/08/2015 0233   HCT 38.2 09/08/2015 0233   PLT 484* 09/08/2015 0233   MCV 91.2 09/08/2015 0233   MCH 30.3 09/08/2015 0233   MCHC 33.2 09/08/2015 0233   RDW 11.8 09/08/2015 0233   LYMPHSABS 4.0 06/18/2015 0116   MONOABS 0.5 06/18/2015 0116   EOSABS 0.2 06/18/2015 0116   BASOSABS 0.0 06/18/2015 0116    BMET    Component Value Date/Time   NA 135 09/08/2015 0233   K 3.8 09/08/2015 0233   CL 98* 09/08/2015 0233   CO2 31 09/08/2015 0233   GLUCOSE 214* 09/08/2015 0233   BUN 5* 09/08/2015 0233   CREATININE 0.59 09/08/2015 0233   CALCIUM 9.0 09/08/2015 0233   GFRNONAA >60 09/08/2015 0233   GFRAA >60 09/08/2015 0233     Discharge Instructions:   The patient is discharged to CIR with extensive instructions on wound care and progressive ambulation.  They are instructed not to drive or perform any heavy lifting until returning to see the physician in his office.    Discharge Diagnosis:  Right lower extremity ischemia M62.89; Gangrene right great toe I96  Secondary Diagnosis: Patient Active Problem List   Diagnosis Date Noted  . Status post below knee amputation of right lower extremity (HCC)   . Phantom limb pain (HCC)    . Diabetes mellitus type 2 in obese (HCC)   . Benign essential HTN   . Tobacco abuse   . PAD (peripheral artery disease) (HCC)   . Tachycardia   . Leukocytosis   . Hyponatremia   . Post-operative pain   . Hyperkalemia   . Obesity   . Ischemia of extremity 09/06/2015  . Atherosclerotic peripheral vascular disease (HCC) 05/28/2015  . Abscess and cellulitis 11/05/2014  . Diabetes (HCC) 11/05/2014  . Sepsis (HCC) 11/05/2014   Past Medical History  Diagnosis Date  . Diabetes mellitus without complication (HCC)   . Back pain   . Abscess   . Hyperlipidemia   . Hypertension   . Peripheral vascular disease (HCC)   . Bronchitis   . Pneumonia   . Goiter   . Anxiety   . Depression   . GERD (gastroesophageal reflux disease)   . Headache     migraines  Medication List    ASK your doctor about these medications        aspirin 325 MG tablet  Take 1 tablet (325 mg total) by mouth daily.     atorvastatin 20 MG tablet  Commonly known as:  LIPITOR  Take 20 mg by mouth daily.     cephALEXin 500 MG capsule  Commonly known as:  KEFLEX  Take 1 capsule (500 mg total) by mouth 3 (three) times daily.     cephALEXin 500 MG capsule  Commonly known as:  KEFLEX  Take 1 capsule (500 mg total) by mouth 3 (three) times daily.     cholecalciferol 1000 units tablet  Commonly known as:  VITAMIN D  Take 1,000 Units by mouth daily.     glimepiride 2 MG tablet  Commonly known as:  AMARYL  Take 2 mg by mouth daily with breakfast.     glipiZIDE 10 MG tablet  Commonly known as:  GLUCOTROL  Take 1 tablet (10 mg total) by mouth 2 (two) times daily before a meal.     lisinopril 5 MG tablet  Commonly known as:  PRINIVIL,ZESTRIL  Take 1 tablet (5 mg total) by mouth daily.     metFORMIN 500 MG tablet  Commonly known as:  GLUCOPHAGE  Take 500 mg by mouth 2 (two) times daily with a meal.     nitroGLYCERIN 0.4 mg/hr patch  Commonly known as:  NITRODUR - Dosed in mg/24 hr  Apply to  right foot near big toe daily     oxyCODONE 5 MG immediate release tablet  Commonly known as:  Oxy IR/ROXICODONE  Take 1 tablet (5 mg total) by mouth every 6 (six) hours as needed for severe pain.     vitamin C 250 MG tablet  Commonly known as:  ASCORBIC ACID  Take 250 mg by mouth daily.        Disposition: CIR  Patient's condition: is Good  Follow up: 1. Dr. Edilia Bo in 4 weeks   Maris Berger, PA-C [facility] [phone] 09/10/2015  9:38 AM

## 2015-09-10 NOTE — Interval H&P Note (Signed)
Reece AgarStephanie Whitmire was admitted today to Inpatient Rehabilitation with the diagnosis of right BKA.  The patient's history has been reviewed, patient examined, and there is no change in status.  Patient continues to be appropriate for intensive inpatient rehabilitation.  I have reviewed the patient's chart and labs.  Questions were answered to the patient's satisfaction. The PAPE has been reviewed and assessment remains appropriate.  Christoher Drudge Karis Jubanil Stirling Orton 09/10/2015, 8:52 PM

## 2015-09-10 NOTE — Progress Notes (Signed)
09/10/2015 5:30 PM Called report to Ed on 4W.  Will transfer pt. After pt eats. Kathryne HitchAllen, Lynnelle Mesmer C

## 2015-09-11 ENCOUNTER — Inpatient Hospital Stay (HOSPITAL_COMMUNITY): Payer: Medicaid Other | Admitting: Physical Therapy

## 2015-09-11 ENCOUNTER — Inpatient Hospital Stay (HOSPITAL_COMMUNITY): Payer: Medicaid Other | Admitting: Occupational Therapy

## 2015-09-11 DIAGNOSIS — R269 Unspecified abnormalities of gait and mobility: Principal | ICD-10-CM

## 2015-09-11 DIAGNOSIS — S88111S Complete traumatic amputation at level between knee and ankle, right lower leg, sequela: Secondary | ICD-10-CM

## 2015-09-11 LAB — CBC WITH DIFFERENTIAL/PLATELET
BASOS PCT: 0 %
Basophils Absolute: 0 10*3/uL (ref 0.0–0.1)
EOS ABS: 0.1 10*3/uL (ref 0.0–0.7)
Eosinophils Relative: 2 %
HCT: 38.2 % (ref 36.0–46.0)
HEMOGLOBIN: 12.7 g/dL (ref 12.0–15.0)
Lymphocytes Relative: 33 %
Lymphs Abs: 2.4 10*3/uL (ref 0.7–4.0)
MCH: 30.2 pg (ref 26.0–34.0)
MCHC: 33.2 g/dL (ref 30.0–36.0)
MCV: 91 fL (ref 78.0–100.0)
Monocytes Absolute: 0.6 10*3/uL (ref 0.1–1.0)
Monocytes Relative: 9 %
NEUTROS PCT: 56 %
Neutro Abs: 4.1 10*3/uL (ref 1.7–7.7)
PLATELETS: 573 10*3/uL — AB (ref 150–400)
RBC: 4.2 MIL/uL (ref 3.87–5.11)
RDW: 11.6 % (ref 11.5–15.5)
WBC: 7.3 10*3/uL (ref 4.0–10.5)

## 2015-09-11 LAB — COMPREHENSIVE METABOLIC PANEL
ALBUMIN: 2.4 g/dL — AB (ref 3.5–5.0)
ALK PHOS: 92 U/L (ref 38–126)
ALT: 16 U/L (ref 14–54)
ANION GAP: 9 (ref 5–15)
AST: 14 U/L — ABNORMAL LOW (ref 15–41)
BUN: 6 mg/dL (ref 6–20)
CALCIUM: 9.4 mg/dL (ref 8.9–10.3)
CHLORIDE: 99 mmol/L — AB (ref 101–111)
CO2: 30 mmol/L (ref 22–32)
Creatinine, Ser: 0.47 mg/dL (ref 0.44–1.00)
GFR calc Af Amer: >60 mL/min (ref >60)
GFR calc non Af Amer: >60 mL/min (ref >60)
GLUCOSE: 169 mg/dL — AB (ref 65–99)
Potassium: 4 mmol/L (ref 3.5–5.1)
SODIUM: 138 mmol/L (ref 135–145)
Total Bilirubin: 0.2 mg/dL — ABNORMAL LOW (ref 0.3–1.2)
Total Protein: 6.7 g/dL (ref 6.5–8.1)

## 2015-09-11 LAB — GLUCOSE, CAPILLARY
GLUCOSE-CAPILLARY: 239 mg/dL — AB (ref 65–99)
Glucose-Capillary: 158 mg/dL — ABNORMAL HIGH (ref 65–99)
Glucose-Capillary: 145 mg/dL — ABNORMAL HIGH (ref 65–99)
Glucose-Capillary: 160 mg/dL — ABNORMAL HIGH (ref 65–99)

## 2015-09-11 NOTE — Evaluation (Addendum)
Physical Therapy Assessment and Plan  Patient Details  Name: Debra [name] MRN: [id number] Date of Birth: 28-Nov-1975  PT Diagnosis: Abnormality of gait, Difficulty walking, Impaired sensation, Muscle weakness and Pain in R residual limb Rehab Potential: Good ELOS: 7-10 days   Today's Date: 09/11/2015 PT Individual Time: 0109-3235 and 5732-2025 PT Individual Time Calculation (min): 55 min and 24 min    Problem List:  Patient Active Problem List   Diagnosis Date Noted  . Unilateral complete BKA ([place]) 09/10/2015  . Status post below knee amputation of right lower extremity ([place])   . Phantom limb pain ([place])   . Diabetes mellitus type 2 in obese ([place])   . Benign essential HTN   . Tobacco abuse   . PAD (peripheral artery disease) ([place])   . Tachycardia   . Leukocytosis   . Hyponatremia   . Post-operative pain   . Hyperkalemia   . Obesity   . Ischemia of extremity 09/06/2015  . Atherosclerotic peripheral vascular disease ([place]) 05/28/2015  . Abscess and cellulitis 11/05/2014  . Diabetes ([place]) 11/05/2014  . Sepsis ([place]) 11/05/2014    Past Medical History:  Past Medical History  Diagnosis Date  . Diabetes mellitus without complication ([place])   . Back pain   . Abscess   . Hyperlipidemia   . Hypertension   . Peripheral vascular disease ([place])   . Bronchitis   . Pneumonia   . Goiter   . Anxiety   . Depression   . GERD (gastroesophageal reflux disease)   . Headache     migraines   Past Surgical History:  Past Surgical History  Procedure Laterality Date  . Peripheral vascular catheterization N/A 05/28/2015    Procedure: Abdominal Aortogram;  Surgeon: Angelia Mould, MD;  Location: [facility];  Service: Cardiovascular;  Laterality: N/A;  . 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 Amputation Right 09/06/2015    Procedure: AMPUTATION BELOW KNEE;  Surgeon: Angelia Mould, MD;  Location: [facility] OR;  Service: Vascular;  Laterality: Right;    Assessment &  Plan Clinical Impression: Patient is a 40 y.o. year old female with history of T2DM, HTN, ongoing tobacco use, coolness bilateral feet with evidence of PAD who developed progressive gangrenous changes right foot with rest pain and ischemia. She failed conservative treatment. 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 Scot Dock. She has associated phantom limb pain. Post op therapy evaluations recommending CIR. Patient transferred to CIR on 09/10/2015 .   Patient currently requires min with mobility secondary to muscle weakness, decreased cardiorespiratoy endurance and decreased sitting balance, decreased standing balance and decreased balance strategies.  Prior to hospitalization, patient was independent  with mobility and lived with Other (Comment) (mother & father) in a House home.  Home access is  Ramped entrance.  Patient will benefit from skilled PT intervention to maximize safe functional mobility, minimize fall risk and decrease caregiver burden for planned discharge home with intermittent assist.  Anticipate patient will benefit from follow up [facility] at discharge.  PT - End of Session Activity Tolerance: Tolerates 30+ min activity without fatigue Endurance Deficit: Yes Endurance Deficit Description: 2/2 BUE fatigue PT Assessment Rehab Potential (ACUTE/IP ONLY): Good PT Patient demonstrates impairments in the following area(s): Balance;Endurance;Motor;Pain;Safety;Sensory PT Transfers Functional Problem(s): Bed Mobility;Car;Furniture;Bed to Chair PT Locomotion Functional Problem(s): Ambulation;Wheelchair Mobility;Stairs PT Plan PT Intensity: Minimum of 1-2 x/day ,45 to 90 minutes PT Frequency: 5 out of 7 days PT Duration Estimated Length of Stay: 7-10 days PT  Treatment/Interventions: Community reintegration;Patient/family education;Stair training;UE/LE Coordination activities;UE/LE Strength taining/ROM;Pain Industrial/product designer;Neuromuscular re-education;Skin care/wound management;Therapeutic Exercise;Wheelchair propulsion/positioning;Visual/perceptual remediation/compensation;Therapeutic Activities;Psychosocial support;Functional mobility training;Discharge planning;Ambulation/gait training PT Transfers Anticipated Outcome(s): Mod I PT Locomotion Anticipated Outcome(s): mod I PT Recommendation Follow Up Recommendations: Home health PT Patient destination: Home Equipment Recommended: Rolling walker with 5" wheels;Wheelchair (measurements)  Skilled Therapeutic Intervention Treatment 1: Pt received in bed & agreeable to PT, noting 6/10 R residual limb pain but reports being premedicated. PT evaluation initiated; pt educated on CIR schedule, safety plan, and expected length of stay. Pt is currently at a supervision level for bed mobility, but requires Min A for transfers (sit<>stand, car with RW, and squat pivot). Pt reports she lives in a 2 level home with 1 STE with her daughter, but reports she plans to d/c home with mother who has a handicap accessible house as father has bilateral amputees. Pt tends to utilize BUE to move RLE 2/2 pain/soreness. At end of session pt left sitting in w/c in room with all needs within reach & setup with lunch tray.  Treatment 2: Pt received in w/c on telephone & agreeable to PT. Pt requested to tell father (on phone) bye but continued to talk on phone; educated pt on missing her therapy time by being on phone & encouraged participation with pt agreeable. Pt's daughter present for session & pt noted 4/10 R residual limb pain but premedicated. Pt propelled w/c x 100 ft room>gym with supervision A. Pt able to negotiate 4 steps (3") with B rails & Min A; provided cuing to push through BUE to advance LLE. Pt attempted to lean on forearms on rails & required cuing for upright posture. Gait training over uneven surface x 10 ft with RW & Min A completed with pt with impaired LLE foot  clearance. Remainder of session focused on w/c mobility x 300 ft with supervision for endurance training. PT educated pt on w/c pushups with pt able to return demonstrate; educated pt to perform pressure relieving technique every 20 minutes throughout day to prevent skin breakdown. At end of session pt left sitting in w/c with daughter present & all needs within reach.   PT Evaluation Precautions/Restrictions Precautions Precautions: Fall;Other (comment) Precaution Comments: R BKA Restrictions Weight Bearing Restrictions: Yes RLE Weight Bearing: Non weight bearing  General Chart Reviewed: Yes Response to Previous Treatment: Patient with no complaints from previous session. Family/Caregiver Present: No   Pain Pain Assessment Pain Assessment: 0-10 Pain Score: 6  Pain Location:  (residual limb) Pain Orientation: Right Pain Intervention(s):  (premedicated)   Home Living/Prior Functioning Home Living Available Help at Discharge: Family;Available 24 hours/day Type of Home: House Home Access: Ramped entrance Home Layout: One level  Lives With: Other (Comment) (mother & father) Prior Function Level of Independence: Independent with basic ADLs;Independent with gait;Independent with homemaking with ambulation;Independent with transfers (prior to R LE pain; pt independent without AD)  Able to Take Stairs?: Yes Driving: Yes Vocation: Unemployed Leisure: Hobbies-yes (Comment) Comments: arts & crafts   Vision/Perception  Pt reports wearing glasses at all times at baseline. Pt denies any changes in vision since admission to CIR.  Cognition Overall Cognitive Status: Within Functional Limits for tasks assessed Arousal/Alertness: Awake/alert Orientation Level: Oriented X4;Oriented to person;Oriented to place;Oriented to time;Oriented to situation Awareness: Impaired Safety/Judgment: Impaired   Sensation Sensation Light Touch: Impaired by gross assessment (impaired  LLE) Proprioception: Appears Intact (LLE) Coordination Gross Motor Movements are Fluid and Coordinated: Yes   Mobility Bed Mobility  Bed Mobility: Rolling Right;Rolling Left;Supine to Sit;Sit to Supine (regular bed) Rolling Right: 5: Supervision Rolling Left: 5: Supervision Supine to Sit: 5: Supervision Sit to Supine: 5: Supervision Transfers Transfers: Yes Sit to Stand: 4: Min guard Stand to Sit: 4: Min guard Stand Pivot Transfers: 4: Min Teacher, English as a foreign language Transfers: 4: Min assist   Locomotion  Ambulation Ambulation: Yes Ambulation/Gait Assistance: 4: Min Wellsite geologist (Feet): 50 Feet Assistive device: Rolling walker Gait Gait: Yes Gait Pattern: Step-to pattern Wheelchair Mobility Wheelchair Mobility: Yes Wheelchair Assistance: 5: Careers information officer: Both upper extremities Wheelchair Parts Management: Supervision/cueing Distance: 200    Balance Balance Balance Assessed: Yes Theatre stage manager Standing - Balance Support: Bilateral upper extremity supported Static Standing - Level of Assistance: 4: Min assist   Extremity Assessment  RLE Assessment RLE Assessment: Exceptions to [facility] (MMT; ROM limited by pain) RLE AROM (degrees) Overall AROM Right Lower Extremity: Within functional limits for tasks assessed RLE Strength Right Hip Flexion: 3-/5 Right Knee Flexion: 3-/5 Right Knee Extension: 3/5 LLE Assessment LLE Assessment: Exceptions to WFL LLE AROM (degrees) Overall AROM Left Lower Extremity: Within functional limits for tasks assessed LLE Strength Left Hip Flexion: 3+/5 Left Knee Flexion: 3+/5 Left Knee Extension: 3+/5 Left Ankle Dorsiflexion: 4/5   See Function Navigator for Current Functional Status.   Refer to Care Plan for Long Term Goals  Recommendations for other services: Neuropsych  Discharge Criteria: Patient will be discharged from PT if patient refuses treatment 3 consecutive times without medical  reason, if treatment goals not met, if there is a change in medical status, if patient makes no progress towards goals or if patient is discharged from hospital.  The above assessment, treatment plan, treatment alternatives and goals were discussed and mutually agreed upon: by patient  Waunita Schooner 09/11/2015, 7:55 AM

## 2015-09-11 NOTE — Progress Notes (Signed)
Social Work  Social Work Assessment and Plan  Patient Details  Name: Debra [name] MRN: [id number] Date of Birth: 05-Nov-1975  Today's Date: 09/11/2015  Problem List:  Patient Active Problem List   Diagnosis Date Noted  . Abnormality of gait   . Unilateral complete BKA ([facility]) 09/10/2015  . Status post below knee amputation of right lower extremity ([facility])   . Phantom limb pain ([facility])   . Diabetes mellitus type 2 in obese ([facility])   . Benign essential HTN   . Tobacco abuse   . PAD (peripheral artery disease) ([facility])   . Tachycardia   . Leukocytosis   . Hyponatremia   . Post-operative pain   . Hyperkalemia   . Obesity   . Ischemia of extremity 09/06/2015  . Atherosclerotic peripheral vascular disease ([facility]) 05/28/2015  . Abscess and cellulitis 11/05/2014  . Diabetes ([facility]) 11/05/2014  . Sepsis ([facility]) 11/05/2014   Past Medical History:  Past Medical History  Diagnosis Date  . Diabetes mellitus without complication ([facility])   . Back pain   . Abscess   . Hyperlipidemia   . Hypertension   . Peripheral vascular disease ([facility])   . Bronchitis   . Pneumonia   . Goiter   . Anxiety   . Depression   . GERD (gastroesophageal reflux disease)   . Headache     migraines   Past Surgical History:  Past Surgical History  Procedure Laterality Date  . Peripheral vascular catheterization N/A 05/28/2015    Procedure: Abdominal Aortogram;  Surgeon: Chuck Hint, MD;  [facility]: [facility] INVASIVE CV LAB;  Service: Cardiovascular;  Laterality: N/A;  . 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 Amputation Right 09/06/2015    Procedure: AMPUTATION BELOW KNEE;  Surgeon: Chuck Hint, MD;  Location: [facility] OR;  Service: Vascular;  Laterality: Right;   Social History:  reports that she quit smoking about 2 weeks ago. Her smoking use included Cigarettes. She has a 5 pack-year smoking history. She has never used smokeless tobacco. She reports that she does not drink alcohol or use illicit  drugs.  Family / Support Systems Marital Status: Single Patient Roles: Parent Children: Diane-91 yo daughter and she has a 49 yo daughter also Other Supports: Linda-mom [phone]-cell  Jala Dundon [phone] Anticipated Caregiver: Mom she does have some health issues and uses a cane in the community, but also pt's daughter-Diane Ability/Limitations of Caregiver: Mom cares for pt's Dad who is a bilateral amputee-daughter also will assist Caregiver Availability: 24/7 Family Dynamics: Close knit family who are always there for one another. Pt wants to be as independent as possible she does not want to burden her family and feels she has learned from her father and his limb loss. She hopes to be back at her apartment soon.  Social History Preferred language: English Religion: Baptist Cultural Background: No issues Education: [employer]  Read: Yes Write: Yes Employment Status: Unemployed Date Retired/Disabled/Unemployed: 5 years, will try to get disability now Legal Hisotry/Current Legal Issues: No issues Guardian/Conservator: None-according to MD pt is capable of making her own decisions while here.   Abuse/Neglect Physical Abuse: Denies Verbal Abuse: Denies Sexual Abuse: Denies Exploitation of patient/patient's resources: Denies Self-Neglect: Denies  Emotional Status Pt's affect, behavior adn adjustment status: Pt is motivated to improve and regain her independence. She wants to just stay at parents home for a short time then return to her own. She will try to do what is asked of her, her daughter's are strong supports  for her.  Recent Psychosocial Issues: other health issues needs to manage them better Pyschiatric History: History of depression/anxiety takes medications for this and finds then helpful. May benefit from neuro-psych and peer support while here. Pt is open when discussing her amputation and struggles, she is relieved due to it was causing her much  pain. Substance Abuse History: Tobacco-needs to quit but is having trouble doing this. Aware of the resources available to her for this.  Patient / Family Perceptions, Expectations & Goals Pt/Family understanding of illness & functional limitations: Pt is able to explain her amputation and what a relief it is to not have the extensive pain she was having. She talks with the MD daily and feels her questions are being answered. She hopes she is not here long Premorbid pt/family roles/activities: Mom, Daughter, friend, sister, etc Anticipated changes in roles/activities/participation: resume Pt/family expectations/goals: Pt states: " I want to do for myself before I leave."  Mom states: " We will do whatever she needs, we have experience with this."  [employer] Resources Community Agencies: None Premorbid Home Care/DME Agencies: None Transportation available at discharge: [employer] Resource referrals recommended: Neuropsychology, Support group (specify)  Discharge Planning Living Arrangements: Children Support Systems: Parent, Friends/neighbors, Children, Other relatives Type of Residence: Private residence Insurance Resources: Medicaid (specify county) (Switching to full Medicaid) Financial Resources: Other (Comment) (Assistance applying for disability) Financial Screen Referred: Yes Living Expenses: Rent Money Management: Patient Does the patient have any problems obtaining your medications?: No Home Management: Patient and her daughter's Patient/Family Preliminary Plans: Pt plans to go to parents home since they have a ramped entry. Hopefully will be able to go bakc to her home this is her goal. She has Mom and daughter's to assist her at home. Will work on discharge plan and await team's evaluations. Social Work Anticipated Follow Up Needs: HH/OP, Support Group  Clinical Impression Pleasant female who is motivated to do well here and return home soon. She has good family support who are  willing to assist her. She is relieved she is not in as much pain as prior to her amputation. She plans on applying for disability and getting her Medicaid switched to full from family planning. Will work on discharge needs and await team's evaluations  Micalah Cabezas, Lemar LivingsRebecca G 09/11/2015, 1:21 PM

## 2015-09-11 NOTE — Progress Notes (Addendum)
Occupational Therapy Assessment and Plan  Patient Details  Name: Debra [name] MRN: [id number] Date of Birth: Jan 04, 1976  OT Diagnosis: acute pain and muscle weakness (generalized) Rehab Potential: Rehab Potential (ACUTE ONLY): Good ELOS: 5-10 days   Today's Date: 09/11/2015 OT Individual Time:  -  [id number] OT Treatment Time: 74 minutes        Problem List:  Patient Active Problem List   Diagnosis Date Noted  . Abnormality of gait   . Unilateral complete BKA ([place]) 09/10/2015  . Status post below knee amputation of right lower extremity ([place])   . Phantom limb pain ([place])   . Diabetes mellitus type 2 in obese ([place])   . Benign essential HTN   . Tobacco abuse   . PAD (peripheral artery disease) ([place])   . Tachycardia   . Leukocytosis   . Hyponatremia   . Post-operative pain   . Hyperkalemia   . Obesity   . Ischemia of extremity 09/06/2015  . Atherosclerotic peripheral vascular disease ([place]) 05/28/2015  . Abscess and cellulitis 11/05/2014  . Diabetes ([place]) 11/05/2014  . Sepsis ([place]) 11/05/2014    Past Medical History:  Past Medical History  Diagnosis Date  . Diabetes mellitus without complication ([place])   . Back pain   . Abscess   . Hyperlipidemia   . Hypertension   . Peripheral vascular disease ([place])   . Bronchitis   . Pneumonia   . Goiter   . Anxiety   . Depression   . GERD (gastroesophageal reflux disease)   . Headache     migraines   Past Surgical History:  Past Surgical History  Procedure Laterality Date  . Peripheral vascular catheterization N/A 05/28/2015    Procedure: Abdominal Aortogram;  Surgeon: Angelia Mould, MD;  Location: [facility];  Service: Cardiovascular;  Laterality: N/A;  . 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 Amputation Right 09/06/2015    Procedure: AMPUTATION BELOW KNEE;  Surgeon: Angelia Mould, MD;  Location: [facility];  Service: Vascular;  Laterality: Right;    Assessment & Plan Clinical Impression:   Debra [name] is a 40 y.o. female with history of T2DM, HTN, ongoing tobacco use, coolness bilateral feet with evidence of PAD who developed progressive gangrenous changes right foot with rest pain and ischemia. She failed conservative treatment. 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 Scot Dock. She has associated phantom limb pain.   Patient currently requires overall Min A with BADLs secondary to muscle weakness, decreased cardiorespiratoy endurance, decreased safety awareness and decreased balance strategies.  Prior to hospitalization, patient could complete BADLs with min A from daughters. Due to rehab potential skilled OT aims to further decrease burden of care on family to maximize pts independence with self care.  Patient will benefit from skilled intervention to increase independence with basic self-care skills prior to discharge home with care partner.  Anticipate patient will require 24 hour supervision and follow up home health.  OT - End of Session Activity Tolerance: Tolerates 10 - 20 min activity with multiple rests Endurance Deficit: Yes OT Assessment Rehab Potential (ACUTE ONLY): Good Barriers to Discharge: Decreased caregiver support Barriers to Discharge Comments: Pt reports that d/c location is TBD due to familial disagreements OT Patient demonstrates impairments in the following area(s): Balance;Cognition;Endurance;Pain;Safety OT Basic ADL's Functional Problem(s): Bathing;Dressing;Toileting OT Transfers Functional Problem(s): Toilet;Tub/Shower OT Additional Impairment(s): None OT Plan OT Intensity: Minimum of 1-2 x/day, 45 to 90 minutes OT Frequency: 5 out of 7 days OT Duration/Estimated  Length of Stay: 5-10 days OT Treatment/Interventions: Balance/vestibular training;DME/adaptive equipment instruction;Patient/family education;Therapeutic Activities;Wheelchair propulsion/positioning;Therapeutic Exercise;Self Care/advanced ADL retraining;Functional mobility  training;UE/LE Strength taining/ROM;Discharge planning;Neuromuscular re-education;Pain management OT Self Feeding Anticipated Outcome(s): Mod I self feeding OT Basic Self-Care Anticipated Outcome(s): S-Mod I with BADLs OT Toileting Anticipated Outcome(s): Mod I toileting  OT Bathroom Transfers Anticipated Outcome(s): Supervision tub bench transfer  OT Recommendation Recommendations for Other Services: Neuropsych consult Patient destination: Home Follow Up Recommendations: Home health OT Equipment Recommended: To be determined   Skilled Therapeutic Intervention Today pt was seen for evaluation of BADLs. Pt completed stand pivot transfer from bed to w/c with Min A for steadying assistance. Pt reported having no clothes for session, and called family to bring clothes in afternoon. Pt became noticeably agitated by situation as evidenced by crying and using explicative language with family members on phone. Pt was provided encouragement and therapeutic listening to continue with ADL session. Due to physician orders, pt completed bathing sinkside standing as needed with sink support and overall Min A for balance. Pt donned johnny over overhead shirt with supervision for setup. Pt completed LB dressing of mesh underwear with Min A for steadying assistance for pulling over hips. Pt completed oral care and grooming tasks w/c level at sink with setup. Pt completed toilet transfer with Min A and use of grab bars. Toileting was completed with overall Min A for steadying assistance for clothing mgt. Proprioception and sensation of bilateral UEs assessed with no apparent deficits. Pt does report numbness/tingling in UEs at rest. Pt was provided education on educational materials in room, including desentization strategies, pain mgt, and safety with skin inspection. At end of session, pt was left with all needs within reach while on the phone with family.  OT Evaluation Precautions/Restrictions   Precautions Precautions: Fall Restrictions Weight Bearing Restrictions: Yes RLE Weight Bearing: Non weight bearing General Chart Reviewed: Yes OT Amount of Missed Time: 30 Minutes Family/Caregiver Present: No Vital Signs Therapy Vitals Temp: 98 F (36.7 C) Temp Source: Oral Pulse Rate: (!) 104 Resp: 18 BP: 136/79 mmHg Patient Position (if appropriate): Sitting Oxygen Therapy SpO2: 97 % O2 Device: Not Delivered Pain   Home Living/Prior Functioning Home Living Living Arrangements: Children Available Help at Discharge: Available 24 hours/day Type of Home: House Home Access: Ramped entrance Home Layout: One level Bathroom Shower/Tub: Optometrist: Yes  Lives With: Family IADL History Homemaking Responsibilities: No Occupation: Unemployed IADL Comments: Pt reports that daughters assist with cooking/cleaning responsibilities Prior Function Level of Independence: Needs assistance with ADLs  Able to Take Stairs?: Yes Driving: Yes Vocation: Unemployed Leisure: Hobbies-yes (Comment) Comments: arts & crafts ADL   Vision/Perception  Vision- History Baseline Vision/History: Wears glasses Wears Glasses: At all times Patient Visual Report: No change from baseline Vision- Assessment Vision Assessment?: No apparent visual deficits  Cognition Overall Cognitive Status: Within Functional Limits for tasks assessed Arousal/Alertness: Awake/alert Orientation Level: Person;Place;Situation Person: Oriented Place: Oriented Situation: Oriented Year: 2017 Month: July Day of Week: Correct Memory: Appears intact Immediate Memory Recall: Sock;Blue;Bed Memory Recall: Sock;Blue;Bed Memory Recall Sock: Without Cue Memory Recall Blue: Without Cue Memory Recall Bed: Without Cue Attention: Focused Awareness: Impaired Behaviors: Impulsive Safety/Judgment: Impaired Sensation Sensation Light Touch: Appears Intact (Pt reported  having numbness in bilateral UEs but was able to distinguish different pressure touches and locations; impaired wiith LEs) Stereognosis: Appears Intact Hot/Cold: Appears Intact Proprioception: Appears Intact Coordination Gross Motor Movements are Fluid and Coordinated: Yes Fine Motor Movements are Fluid and  Coordinated: Yes Motor  Motor Motor: Within Functional Limits Motor - Skilled Clinical Observations: functional transfer from bed to chair, w/c to toilet; UB/LB ADL completion, grooming/oral care completion Mobility  Transfers Transfers: Sit to Stand Sit to Stand: 4: Min assist Sit to Stand Details: Verbal cues for technique;Verbal cues for precautions/safety;Visual cues/gestures for sequencing Stand to Sit: 4: Min assist  Trunk/Postural Assessment  Cervical Assessment Cervical Assessment: Within Functional Limits Thoracic Assessment Thoracic Assessment: Within Functional Limits Lumbar Assessment Lumbar Assessment: Within Functional Limits Postural Control Postural Control: Within Functional Limits  Balance Balance Balance Assessed: Yes Static Standing Balance Static Standing - Balance Support: Bilateral upper extremity supported Static Standing - Level of Assistance: 4: Min assist Extremity/Trunk Assessment RUE Assessment RUE Assessment: Within Functional Limits LUE Assessment LUE Assessment: Within Functional Limits   See Function Navigator for Current Functional Status.   Refer to Care Plan for Long Term Goals  Recommendations for other services: [facility] OT  Discharge Criteria: Patient will be discharged from OT if patient refuses treatment 3 consecutive times without medical reason, if treatment goals not met, if there is a change in medical status, if patient makes no progress towards goals or if patient is discharged from hospital.  The above assessment, treatment plan, treatment alternatives and goals were discussed and mutually agreed upon: by patient  Skeet Simmer 09/11/2015, 4:49 PM

## 2015-09-11 NOTE — Plan of Care (Signed)
Problem: RH Balance Goal: LTG Patient will maintain dynamic standing balance (PT) LTG: Patient will maintain dynamic standing balance with assistance during mobility activities (PT) With LRAD  Problem: RH Car Transfers Goal: LTG Patient will perform car transfers with assist (PT) LTG: Patient will perform car transfers with assistance (PT). With LRAD  Problem: RH Furniture Transfers Goal: LTG Patient will perform furniture transfers w/assist (OT/PT LTG: Patient will perform furniture transfers with assistance (OT/PT). With LRAD  Problem: RH Ambulation Goal: LTG Patient will ambulate in controlled environment (PT) LTG: Patient will ambulate in a controlled environment, # of feet with assistance (PT). 150 ft with LRAD  Problem: RH Wheelchair Mobility Goal: LTG Patient will propel w/c in controlled environment (PT) LTG: Patient will propel wheelchair in controlled environment, # of feet with assist (PT) 150 ft  Goal: LTG Patient will propel w/c in community environment (PT) LTG: Patient will propel wheelchair in community environment, # of feet with assist (PT) 150 ft  Problem: RH Stairs Goal: LTG Patient will ambulate up and down stairs w/assist (PT) LTG: Patient will ambulate up and down # of stairs with assistance (PT) 3 steps with B rails for strengthening

## 2015-09-11 NOTE — Progress Notes (Signed)
Social Work Patient ID: Debra [name], female   DOB: March 06, 1975, 40 y.o.   MRN: [id number] Met with pt who reports her plan now is to go to her home since her Mom is not treating her girls well. She feels they can mange at home and she will just plan to sleep downstairs, since all of the bedrooms Are upstairs. She wanted to know if she could leave and come back informed her there were no passes. She will have her brother take them to his home then. She wants to do well and go home as soon as Possible.

## 2015-09-11 NOTE — Progress Notes (Signed)
Patient information reviewed and entered into eRehab system by Vada Yellen, RN, CRRN, PPS Coordinator.  Information including medical coding and functional independence measure will be reviewed and updated through discharge.    

## 2015-09-11 NOTE — Care Management Note (Signed)
[facility]patient [facility] Individual Statement of Services  Patient Name:  Debra [name]  Date:  09/11/2015  Welcome to the [facility]patient [facility].  Our goal is to provide you with an individualized program based on your diagnosis and situation, designed to meet your specific needs.  With this comprehensive rehabilitation program, you will be expected to participate in at least 3 hours of rehabilitation therapies Monday-Friday, with modified therapy programming on the weekends.  Your rehabilitation program will include the following services:  Physical Therapy (PT), Occupational Therapy (OT), 24 hour per day rehabilitation nursing, Therapeutic Recreaction (TR), Neuropsychology, Case Management (Social Worker), Rehabilitation Medicine, Nutrition Services and Pharmacy Services  Weekly team conferences will be held on Wednesday to discuss your progress.  Your Social Worker will talk with you frequently to get your input and to update you on team discussions.  Team conferences with you and your family in attendance may also be held.  Expected length of stay: 7-10 days Overall anticipated outcome: mod/i level  Depending on your progress and recovery, your program may change. Your Social Worker will coordinate services and will keep you informed of any changes. Your Social Worker's name and contact numbers are listed  below.  The following services may also be recommended but are not provided by the Inpatient Rehabilitation Center:   Driving Evaluations  Home Health Rehabiltiation Services  Outpatient Rehabilitation Services  Vocational Rehabilitation   Arrangements will be made to provide these services after discharge if needed.  Arrangements include referral to agencies that provide these services.  Your insurance has been verified to be:  Family Planning Medicaid to switch to Full Medicaid Your primary doctor is:  Jaclyn Shaggymao Enobong  Pertinent information will be shared with  your doctor and your insurance company.  Social Worker:  Dossie DerBecky Reana Chacko, SW [phone] or ([phone]) [phone]  Information discussed with and copy given to patient by: Lucy Chrisupree, Brigette Hopfer G, 09/11/2015, 11:23 AM

## 2015-09-11 NOTE — Progress Notes (Signed)
Occupational Therapy Note  Patient Details  Name: Reece AgarStephanie Sigal MRN: [id number] Date of Birth: 05/08/1975  Today's Date: 09/11/2015 OT Missed Time: 30 Minutes Missed Time Reason: Other (comment) (Pt is off the floor with family during scheduled therapy time. )  Pt unable to be found during scheduled therapy time. RN notified. Pt missing 30 minutes of skilled OT intervention.    Lowella Gripittman, Hobson Lax L 09/11/2015, 4:14 PM

## 2015-09-11 NOTE — Progress Notes (Signed)
[facility]     PROGRESS NOTE  Subjective/Complaints: Pt seen laying in bed this AM getting bathed.  On later evaluation, pt resting in bed.  She states that she had a nightmare. She continues to stump pain.   ROS: +Stump pain. Denies CP, SOB, N/V/D.  Objective: Vital Signs: Blood pressure 121/75, pulse 94, temperature 98.6 F (37 C), temperature source Oral, resp. rate 16, weight 102.694 kg (226 lb 6.4 oz), last menstrual period 08/22/2015, SpO2 99 %. No results found.  Recent Labs  09/11/15 0431  WBC 7.3  HGB 12.7  HCT 38.2  PLT 573*    Recent Labs  09/11/15 0431  NA 138  K 4.0  CL 99*  GLUCOSE 169*  BUN 6  CREATININE 0.47  CALCIUM 9.4   CBG (last 3)   Recent Labs  09/10/15 1642 09/10/15 2053 09/11/15 0646  GLUCAP 145* 116* 158*    Wt Readings from Last 3 Encounters:  09/10/15 102.694 kg (226 lb 6.4 oz)  09/06/15 116.121 kg (256 lb)  08/29/15 116.121 kg (256 lb)    Physical Exam:  BP 121/75 mmHg  Pulse 94  Temp(Src) 98.6 F (37 C) (Oral)  Resp 16  Wt 102.694 kg (226 lb 6.4 oz)  SpO2 99%  LMP 08/22/2015 Constitutional: She appears well-developed and well-nourished.  HENT: Normocephalic and atraumatic.  Eyes: Conjunctivae and EOM are normal.  Cardiovascular: Normal rate and regular rhythm.  Respiratory: Effort normal and breath sounds normal. No respiratory distress. She has no wheezes.  GI: Soft. Bowel sounds are normal. She exhibits no distension. There is no tenderness.  Musculoskeletal: She exhibits edema and tenderness.  1+ edema LLE.  Tender to touch and had difficulty fully extending at knee without pain.  Neurological: She is alert and oriented to person, place, and time.  Able to follow simple motor commands without difficulty.  Motor: B/l UE, LLE: 5/5 proximal to distal RLE: 3/5 hip flexion (pain inhibition)  Skin: Skin is warm and dry.  R-BKA with moderate edema, staples intact and incision  clean, dry, dm without erythema.  Psychiatric: Her speech is normal. Thought content normal. Her mood appears anxious. She is slowed. Cognition and memory are normal.   Assessment/Plan: 1. Functional deficits secondary to right BKA which require 3+ hours per day of interdisciplinary therapy in a comprehensive inpatient rehab setting. Physiatrist is providing close team supervision and 24 hour management of active medical problems listed below. Physiatrist and rehab team continue to assess barriers to discharge/monitor patient progress toward functional and medical goals.  Function:  Bathing Bathing position      Bathing parts      Bathing assist        Upper Body Dressing/Undressing Upper body dressing                    Upper body assist        Lower Body Dressing/Undressing Lower body dressing                                  Lower body assist        Toileting Toileting   Toileting steps completed by patient: Adjust clothing prior to toileting, Adjust clothing after toileting, Performs perineal hygiene   Toileting Assistive Devices: Grab bar or rail  Toileting assist Assist level: More than reasonable time, Touching or steadying assistance (Pt.75%)   Transfers Chair/bed transfer  Chair/bed transfer method: Stand pivot Chair/bed transfer assist level: Touching or steadying assistance (Pt > 75%) Chair/bed transfer assistive device: Bedrails, Chief Operating Officer          Cognition Comprehension Comprehension assist level: Follows complex conversation/direction with no assist  Expression Expression assist level: Expresses complex ideas: With no assist  Social Interaction Social Interaction assist level: Interacts appropriately with others - No medications needed.  Problem Solving Problem solving assist level: Solves complex problems: Recognizes & self-corrects  Memory Memory assist level: Complete  Independence: No helper    Medical Problem List and Plan: 1.  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  Begin CIR 2.  DVT Prophylaxis/Anticoagulation: Pharmaceutical: Lovenox 3. Pain Management: Oxycodone prn. Low dose neurontin for neuropathy and re-educated on need to limit narcotics.   4. Mood: LCSW to follow for evaluation and support.   5. Neuropsych: This patient is capable of making decisions on her own behalf. 6. Skin/Wound Care: Monitor wound daily for healing. Maintain adequate nutrition and hydration status.   7. Fluids/Electrolytes/Nutrition: Monitor I/O.   BMP within acceptable range on 7/11 8. HTN:  Will monitor BP bid. Continue lisinopril daily.   9.T2DM: Poorly controlled with A1c-12.1. Has been on medications for past 6 years and reports compliance. Changed glucotrol to amaryl as at home. Continue metformin bid. Lantus 25U daily.   10. GERD: Pepcid.   11. PAD: Cont meds 12. Hyponatremia: Resolved  Will cont to monitor 13. Morbid Obesity: Body mass index is 36.73 kg/(m^2). Diet and exercise education.  Encourage weight loss to increase endurance and promote overall health 14. Tobacco abuse: Counsel  LOS (Days) 1 A FACE TO FACE EVALUATION WAS PERFORMED  Ankit Karis Juba 09/11/2015 9:23 AM

## 2015-09-11 NOTE — Plan of Care (Signed)
Problem: Food- and Nutrition-Related Knowledge Deficit (NB-1.1) Goal: Nutrition education Formal process to instruct or train a patient/client in a skill or to impart knowledge to help patients/clients voluntarily manage or modify food choices and eating behavior to maintain or improve health. Outcome: Completed/Met Date Met:  09/11/15  RD consulted for nutrition education regarding diabetes.     Lab Results  Component Value Date    HGBA1C 12.1* 09/06/2015    RD provided "Carbohydrate Counting for People with Diabetes" handout from the Academy of Nutrition and Dietetics. Discussed different food groups and their effects on blood sugar, emphasizing carbohydrate-containing foods. Provided list of carbohydrates and recommended serving sizes of common foods.  Discussed importance of controlled and consistent carbohydrate intake throughout the day. Provided examples of ways to balance meals/snacks and encouraged intake of high-fiber, whole grain complex carbohydrates. Teach back method used.  Expect fair compliance.  Body mass index is 32.48 kg/(m^2). Pt meets criteria for obese based on current BMI.  Current diet order is carb mod, patient is consuming approximately 75-100% of meals at this time. Labs and medications reviewed. No further nutrition interventions warranted at this time. RD contact information provided. If additional nutrition issues arise, please re-consult RD.  William M. Ward, MS, RD LDN Inpatient Clinical Dietitian Pager [phone]         

## 2015-09-11 NOTE — IPOC Note (Signed)
Overall Plan of Care [facility](IPOC) Patient Details Name: Debra AgarStephanie Schrader MRN: [id number] DOB: 12/25/1975  Admitting Diagnosis: BKA  Hospital Problems: Active Problems:   Unilateral complete BKA (HCC)   Abnormality of gait     Functional Problem List: Nursing Edema, Endurance, Medication Management, Pain, Safety, Skin Integrity  PT Balance, Endurance, Motor, Pain, Safety, Sensory  OT Balance, Cognition, Endurance, Pain, Safety  SLP    TR         Basic ADL's: OT Bathing, Dressing, Toileting     Advanced  ADL's: OT       Transfers: PT Bed Mobility, Car, Furniture, Bed to Research scientist (medical)Chair  OT Toilet, Research scientist (life sciences)Tub/Shower     Locomotion: PT Ambulation, Psychologist, prison and probation servicesWheelchair Mobility, Stairs     Additional Impairments: OT None  SLP        TR      Anticipated Outcomes Item Anticipated Outcome  Self Feeding Mod I self feeding  Swallowing      Basic self-care  S-Mod I with BADLs  Toileting  Mod I toileting    Bathroom Transfers Supervision tub bench transfer   Bowel/Bladder  continent of bowel and bladder with min to no assist  Transfers  Mod I  Locomotion  mod I  Communication     Cognition     Pain  pain less than or equal to 4/10 with min assist  Safety/Judgment  free from falls/injury and displaying sound safety judgement with min assist   Therapy Plan: PT Intensity: Minimum of 1-2 x/day ,45 to 90 minutes PT Frequency: 5 out of 7 days PT Duration Estimated Length of Stay: 7-10 days OT Intensity: Minimum of 1-2 x/day, 45 to 90 minutes OT Frequency: 5 out of 7 days OT Duration/Estimated Length of Stay: 5-10 days         Team Interventions: Nursing Interventions Patient/Family Education, Pain Management, Disease Management/Prevention, Medication Management, Skin Care/Wound Management, Discharge Planning  PT interventions Community reintegration, Equities traderatient/family education, Museum/gallery curatortair training, UE/LE Coordination activities, UE/LE Strength taining/ROM, Pain management, Futures traderDME/adaptive equipment  instruction, Warden/rangerBalance/vestibular training, Disease management/prevention, Neuromuscular re-education, Skin care/wound management, Therapeutic Exercise, Wheelchair propulsion/positioning, Visual/perceptual remediation/compensation, Therapeutic Activities, Psychosocial support, Functional mobility training, Discharge planning, Ambulation/gait training  OT Interventions Warden/rangerBalance/vestibular training, DME/adaptive equipment instruction, Patient/family education, Therapeutic Activities, Wheelchair propulsion/positioning, Therapeutic Exercise, Self Care/advanced ADL retraining, Functional mobility training, UE/LE Strength taining/ROM, Discharge planning, Neuromuscular re-education, Pain management  SLP Interventions    TR Interventions    SW/CM Interventions Discharge Planning, Psychosocial Support, Patient/Family Education    Team Discharge Planning: Destination: PT-Home ,OT- Home , SLP-  Projected Follow-up: PT-Home health PT, OT-  Home health OT, SLP-  Projected Equipment Needs: PT-Rolling walker with 5" wheels, Wheelchair (measurements), OT- To be determined, SLP-  Equipment Details: PT- , OT-  Patient/family involved in discharge planning: PT- Patient,  OT-Patient, SLP-   MD ELOS: 7-10 days. Medical Rehab Prognosis:  Good Assessment:  40 y.o. female with history of T2DM, HTN, ongoing tobacco use, coolness bilateral feet with evidence of PAD who developed progressive gangrenous changes right foot with rest pain and ischemia. She failed conservative treatment. 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 Edilia Boickson. She has associated phantom limb pain. Pt with functional deficits with gait and mobility.  Will set goals for Supervision/Mod I with therapies.     See Team Conference Notes for weekly updates to the plan of care

## 2015-09-12 ENCOUNTER — Inpatient Hospital Stay (HOSPITAL_COMMUNITY): Payer: Medicaid Other | Admitting: Physical Therapy

## 2015-09-12 ENCOUNTER — Inpatient Hospital Stay (HOSPITAL_COMMUNITY): Payer: Medicaid Other | Admitting: Occupational Therapy

## 2015-09-12 ENCOUNTER — Ambulatory Visit: Payer: Medicaid Other | Admitting: Vascular Surgery

## 2015-09-12 DIAGNOSIS — L97509 Non-pressure chronic ulcer of other part of unspecified foot with unspecified severity: Secondary | ICD-10-CM

## 2015-09-12 DIAGNOSIS — E1152 Type 2 diabetes mellitus with diabetic peripheral angiopathy with gangrene: Secondary | ICD-10-CM | POA: Diagnosis present

## 2015-09-12 DIAGNOSIS — E11621 Type 2 diabetes mellitus with foot ulcer: Secondary | ICD-10-CM | POA: Diagnosis present

## 2015-09-12 LAB — GLUCOSE, CAPILLARY
GLUCOSE-CAPILLARY: 164 mg/dL — AB (ref 65–99)
Glucose-Capillary: 99 mg/dL (ref 65–99)
Glucose-Capillary: 188 mg/dL — ABNORMAL HIGH (ref 65–99)
Glucose-Capillary: 142 mg/dL — ABNORMAL HIGH (ref 65–99)

## 2015-09-12 MED ORDER — INSULIN ASPART 100 UNIT/ML ~~LOC~~ SOLN
6.0000 [IU] | Freq: Three times a day (TID) | SUBCUTANEOUS | Status: DC
  Administered 2015-09-12 – 2015-09-20 (×21): 6 [IU] via SUBCUTANEOUS

## 2015-09-12 MED ORDER — METFORMIN HCL 500 MG PO TABS
1000.0000 mg | ORAL_TABLET | Freq: Two times a day (BID) | ORAL | Status: DC
  Administered 2015-09-12 – 2015-09-20 (×16): 1000 mg via ORAL
  Filled 2015-09-12 (×16): qty 2

## 2015-09-12 NOTE — Progress Notes (Signed)
Physical Therapy Session Note  Patient Details  Name: Debra [name] MRN: [id number] Date of Birth: 11/02/1975  Today's Date: 09/12/2015 PT Individual Time: 0810-0918 PT Individual Time Calculation (min): 68 min   Short Term Goals: Week 1:  PT Short Term Goal 1 (Week 1): STG = LTG due to short ELOS.  Skilled Therapeutic Interventions/Progress Updates:    Pt received in bed noting 10/10 RLE pain; RN notified of pt's c/o pain. Pt able to don underwear & shorts from bed level with supervision & min A from daughter. Pt performed single leg bridging for dressing in bed. Pt transferred supine>sit with use of bed features & PT provided total A for rewrapping RLE. Discussed d/c plans as pt reports she now plans to d/c to her house with her daughters; her house has a single 1-2 inch threshold to enter & a full flight of steps to the 2nd level. Pt reports her bedroom is on the 2nd level but may can ask brother to buy another bed to put downstairs (does not have couch on 1st level). Provided pt with Home Measurement sheet & asked pt to have family/friend complete sheet to ensure pt's house is w/c accessible. Pt stood without PT assistance; educated pt to wait on therapist to have w/c set up before standing as she continues to remain a high fall risk. Pt able to stand with 1 and without BUE support to complete donning underwear & pants. Gait training x 7 ft bed>w/c with RW & supervision A. Pt propelled w/c x 100 ft room>gym with supervision A & PT provided built up amputee support pad to promote R knee extension. Educated pt & daughter on need to promote R knee prosthesis in order to allow pt to receive prosthesis in future; also educated pt to gently rub residual limb as pt reported phantom sensation in R ankle. Utilized BUE recumbent bike on Level 3 x 10 minutes for BUE strengthening & endurance training with pt reporting 13 on Borg RPE scale during activity. Stair training completed x 8 steps (3") with B rails &  min A; pt cued to push up through arms but continues to hop on LLE. Provided pt with different leg rest & adjusted position for optimal comfort. At end of session pt left returning to room with daughter.   Throughout session pt continued to move RLE with BUE support noting significant soreness in residual limb.   Therapy Documentation Precautions:  Precautions Precautions: Fall Precaution Comments: R BKA Restrictions Weight Bearing Restrictions: Yes RLE Weight Bearing: Non weight bearing  General: PT Amount of Missed Time (min): 7 Minutes PT Missed Treatment Reason:  (pt on phone)    See Function Navigator for Current Functional Status.   Therapy/Group: Individual Therapy  Sandi MariscalVictoria M Demetrios Byron 09/12/2015, 5:20 PM

## 2015-09-12 NOTE — Progress Notes (Signed)
Occupational Therapy Session Note  Patient Details  Name: Debra AgarStephanie Daloia MRN: [id number] Date of Birth: 02/14/1976  Today's Date: 09/12/2015 OT Individual Time: 1259-1344 and 1117-1206 (49 minutes) OT Individual Time Calculation (min): 45 min    Short Term Goals: Week 1:  OT Short Term Goal 1 (Week 1): STGs=LTGs  Skilled Therapeutic Interventions/Progress Updates:    Pt participated in skilled OT session focusing on ADL completion and family education. Upon skilled OT arrival, pt c/o high pain and nursing was notified. Pt medicated at beginning of tx session. Pts daughter was present and was provided visual demonstration of setup of ADLs at sinkside. Pt completed ADLs with overall Min A for dynamic standing balance demands. Pt required mod cuing for w/c safety with good carryover near end of session. Skilled OT confirmed new d/c location of pts house instead of mother's. Pt verbalized agreement. Pt reported that her home was a 2 story apartment (with photo image provided), and that pt would reside on 1st floor. 1 STE. Pt has a walker accessible bathroom, standard toilet with no grab bars and a tub/shower combination. Pts daughters would be able to provide 24/7 supervision due to summer vacation. Pt is trying to coordinate with landlord timeline of grab bar installation; therapy must fill out a form in order for this to be completed.  Pts daughter agreed to take pictures of bathroom, bedroom, and kitchen environments for therapy to further assess discharge needs with written instructions provided from skilled OT. Due to new d/c location, pt was agreeable to addition of new IADL goals in POC. Pt left in w/c with all needs within reach at end of session with daughter present.    2nd Session 1:1 Tx (45 minutes)  2nd session today focused on discharge planning with functional transfer completed in home simulated environments. Pt self propelled to tub room with daughter and skilled OT with supervision.  Pt reported that home bathroom is too small to accommodate w/c and would have to be accessed via walker. Tub bench and BSC were placed per home setup. Pt ambulated into bathroom with Min A and RW and transferred to tub bench with Min A with instruction on proper hand placement and technique. [facility] with pt having to complete a 360 degree turn with RW with Min A for instruction on sequencing. Pts daughter was provided education on proper transfer technique including hand placement and w/c safety with verbalized understanding. Pt propelled back to room in w/c to work on dynamic standing balance. Pt participated in simulated LB dressing tasks with use of theraband and supervision while standing with RW on 3rd trial. Pt completed task of laundry folding while standing at sink with close supervision. Pt alternated between unilateral and no UE support on countertop. Longest standing time without rest out of 2 trials: 3 minutes 22 seconds. Pt reported that laundry is a favored IADL. Due to new d/c location, pt is now responsible [facility], as well as simple meal prep. POC has been updated. At end of session, pt was left in w/c with all needs within reach and daughter present. No c/o pain during this session.   Therapy Documentation Precautions:  Precautions Precautions: Fall Precaution Comments: R BKA Restrictions Weight Bearing Restrictions: Yes RLE Weight Bearing: Non weight bearing General: General PT Missed Treatment Reason:  (pt on phone) Vital Signs: Therapy Vitals Temp: 98.3 F (36.8 C) Temp Source: Oral Pulse Rate: 98 Resp: 16 BP: (!) 154/85 mmHg Patient Position (if  appropriate): Sitting Oxygen Therapy SpO2: 99 % O2 Device: Not Delivered :    See Function Navigator for Current Functional Status.   Therapy/Group: Individual Therapy  Sian Rockers A Anuj Summons 09/12/2015, 5:57 PM

## 2015-09-12 NOTE — Progress Notes (Signed)
[place] PHYSICAL MEDICINE & REHABILITATION     PROGRESS NOTE  Subjective/Complaints: Pt laying in bed.  She is sore from therapies yesterday.  She notes little pain and soreness along the inferior aspect of her stump.  ROS: +Stump pain. Denies CP, SOB, N/V/D.  Objective: Vital Signs: Blood pressure 116/67, pulse 86, temperature 99 F (37.2 C), temperature source Oral, resp. rate 16, weight 102.694 kg (226 lb 6.4 oz), last menstrual period 08/22/2015, SpO2 98 %. No results found.  Recent Labs  09/11/15 0431  WBC 7.3  HGB 12.7  HCT 38.2  PLT 573*    Recent Labs  09/11/15 0431  NA 138  K 4.0  CL 99*  GLUCOSE 169*  BUN 6  CREATININE 0.47  CALCIUM 9.4   CBG (last 3)   Recent Labs  09/11/15 1632 09/11/15 2107 09/12/15 0638  GLUCAP 239* 160* 164*    Wt Readings from Last 3 Encounters:  09/10/15 102.694 kg (226 lb 6.4 oz)  09/06/15 116.121 kg (256 lb)  08/29/15 116.121 kg (256 lb)    Physical Exam:  BP 116/67 mmHg  Pulse 86  Temp(Src) 99 F (37.2 C) (Oral)  Resp 16  Wt 102.694 kg (226 lb 6.4 oz)  SpO2 98%  LMP 08/22/2015 Constitutional: She appears well-developed and well-nourished.  HENT: Normocephalic and atraumatic.  Eyes: Conjunctivae and EOM are normal.  Cardiovascular: Normal rate and regular rhythm.  Respiratory: Effort normal and breath sounds normal. No respiratory distress. She has no wheezes.  GI: Soft. Bowel sounds are normal. She exhibits no distension. There is no tenderness.  Musculoskeletal: She exhibits edema and tenderness.  Tender to touch and had difficulty fully extending at knee without pain.  Neurological: She is alert and oriented to person, place, and time.  Able to follow simple motor commands without difficulty.  Motor: B/l UE, LLE: 5/5 proximal to distal RLE: 3/5 hip flexion (pain inhibition)  Skin: Skin is warm and dry.  R-BKA with moderate edema, staples c/d/i without erythema.  Psychiatric: Her speech is normal.  Thought content normal. Her mood appears anxious. She is slowed. Cognition and memory are normal.   Assessment/Plan: 1. Functional deficits secondary to right BKA which require 3+ hours per day of interdisciplinary therapy in a comprehensive inpatient rehab setting. Physiatrist is providing close team supervision and 24 hour management of active medical problems listed below. Physiatrist and rehab team continue to assess barriers to discharge/monitor patient progress toward functional and medical goals.  Function:  Bathing Bathing position   Position: Wheelchair/chair at sink  Bathing parts Body parts bathed by patient: Right arm, Left arm, Chest, Abdomen, Front perineal area, Buttocks, Right upper leg, Left upper leg, Left lower leg Body parts bathed by helper: Back  Bathing assist Assist Level: Touching or steadying assistance(Pt > 75%)      Upper Body Dressing/Undressing Upper body dressing   What is the patient wearing?: Pull over shirt/dress                Upper body assist Assist Level: Supervision or verbal cues      Lower Body Dressing/Undressing Lower body dressing   What is the patient wearing?: Underwear, Hospital Gown, Non-skid slipper socks Underwear - Performed by patient: Thread/unthread right underwear leg, Thread/unthread left underwear leg, Pull underwear up/down       Non-skid slipper socks- Performed by patient: Don/doff left sock                    Lower  body assist Assist for lower body dressing: Supervision or verbal cues      Toileting Toileting   Toileting steps completed by patient: Adjust clothing prior to toileting, Performs perineal hygiene, Adjust clothing after toileting   Toileting Assistive Devices: Grab bar or rail  Toileting assist Assist level: Touching or steadying assistance (Pt.75%)   Transfers Chair/bed transfer   Chair/bed transfer method: Stand pivot Chair/bed transfer assist level: Touching or steadying assistance  (Pt > 75%) Chair/bed transfer assistive device: Armrests     Locomotion Ambulation     Max distance: 10 ft (uneven surface) Assist level: Touching or steadying assistance (Pt > 75%)   Wheelchair   Type: Manual Max wheelchair distance: 200 ft Assist Level: Supervision or verbal cues  Cognition Comprehension Comprehension assist level: Follows complex conversation/direction with no assist  Expression Expression assist level: Expresses complex ideas: With extra time/assistive device  Social Interaction Social Interaction assist level: Interacts appropriately 90% of the time - Needs monitoring or encouragement for participation or interaction.  Problem Solving Problem solving assist level: Solves basic 90% of the time/requires cueing < 10% of the time  Memory Memory assist level: Recognizes or recalls 90% of the time/requires cueing < 10% of the time    Medical Problem List and Plan: 1.  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  Cont CIR 2.  DVT Prophylaxis/Anticoagulation: Pharmaceutical: Lovenox 3. Pain Management: Oxycodone prn. Low dose neurontin for neuropathy and re-educated on need to limit narcotics.   4. Mood: LCSW to follow for evaluation and support.   5. Neuropsych: This patient is capable of making decisions on her own behalf. 6. Skin/Wound Care: Monitor wound daily for healing. Maintain adequate nutrition and hydration status.   7. Fluids/Electrolytes/Nutrition: Monitor I/O.   BMP within acceptable range on 7/11 8. HTN:  Will monitor BP bid. Continue lisinopril daily.   9.T2DM: Poorly controlled with A1c-12.1. Has been on medications for past 6 years and reports compliance.   Changed glucotrol to amaryl as at home.   Continue metformin bid, increased to 1000mg  on 7/12.   Lantus 25U daily.   Novolog 4U TID, increased to 6U TID on 7/12  10. GERD: Pepcid.   11. PAD: Cont meds 12. Hyponatremia: Resolved  Will cont to monitor 13. Morbid Obesity: Body mass index is 36.73  kg/(m^2). Diet and exercise education.  Encourage weight loss to increase endurance and promote overall health 14. Tobacco abuse: Counsel  LOS (Days) 2 A FACE TO FACE EVALUATION WAS PERFORMED  Ankit Karis Jubanil Patel 09/12/2015 8:41 AM

## 2015-09-12 NOTE — Progress Notes (Signed)
Physical Therapy Session Note  Patient Details  Name: Debra [name] MRN: [id number] Date of Birth: 01/03/1976  Today's Date: 09/12/2015 PT Individual Time:1346-1430 Calculated individual time: 44 min        Short Term Goals: Week 1:  PT Short Term Goal 1 (Week 1): STG = LTG due to short ELOS.  Skilled Therapeutic Interventions/Progress Updates:   Patient received sitting in [facility] and agreeable to PT. Patient performed WC mobility for 17025ft with supervision A from PT with min cues for obstacle negotiation.  Squat pivot transfers with min A from PT to and from [facility]. Sit<>SUpine transfer with supervision A from PT.   Supine therex  SAQ BLE x 10  Bridge with push through L foot and R thigh on bolster x 10  Sidelying hip abduction x 10  SLR BLE x 10  PT provided min cues to improve techinque including increased ROM and improve control with eccentric movements.   Patient performed gait training in parallel bars per patient request x 50 ft with min-supervision A from PT and min cues for decreased step length to improve safety  Patient returned to room and left [facility] with call bell within reach.   Therapy Documentation Precautions:  Precautions Precautions: Fall Precaution Comments: R BKA Restrictions Weight Bearing Restrictions: Yes RLE Weight Bearing: Non weight bearing General:   Vital Signs:   Pain: Pain Assessment Pain Assessment: 0-10 Pain Score: 5  Pain Location: Leg (stump) Pain Orientation: Right Pain Descriptors / Indicators: Aching Pain Onset: Gradual Patients Stated Pain Goal: 5  See Function Navigator for Current Functional Status.   Therapy/Group: Individual Therapy  Golden Popustin E Addis Tuohy 09/12/2015, 2:06 PM

## 2015-09-13 ENCOUNTER — Inpatient Hospital Stay (HOSPITAL_COMMUNITY): Payer: Medicaid Other | Admitting: Occupational Therapy

## 2015-09-13 ENCOUNTER — Inpatient Hospital Stay (HOSPITAL_COMMUNITY): Payer: Medicaid Other | Admitting: Physical Therapy

## 2015-09-13 DIAGNOSIS — I10 Essential (primary) hypertension: Secondary | ICD-10-CM

## 2015-09-13 DIAGNOSIS — M792 Neuralgia and neuritis, unspecified: Secondary | ICD-10-CM

## 2015-09-13 DIAGNOSIS — G8918 Other acute postprocedural pain: Secondary | ICD-10-CM

## 2015-09-13 LAB — GLUCOSE, CAPILLARY
GLUCOSE-CAPILLARY: 140 mg/dL — AB (ref 65–99)
GLUCOSE-CAPILLARY: 109 mg/dL — AB (ref 65–99)
GLUCOSE-CAPILLARY: 114 mg/dL — AB (ref 65–99)
Glucose-Capillary: 112 mg/dL — ABNORMAL HIGH (ref 65–99)

## 2015-09-13 MED ORDER — GABAPENTIN 100 MG PO CAPS
100.0000 mg | ORAL_CAPSULE | Freq: Three times a day (TID) | ORAL | Status: DC
  Administered 2015-09-13 – 2015-09-18 (×15): 100 mg via ORAL
  Filled 2015-09-13 (×15): qty 1

## 2015-09-13 NOTE — Progress Notes (Signed)
Social Work Patient ID: Debra [name], female   DOB: 01-Mar-1976, 40 y.o.   MRN: [id number] Met with pt and 76 yo daugher-Diane to discuss home situation. She reports her Mom is not treating her 64 yo daughter well. When discussed if worker needed to contact [place] she reports  56 yo sister is coming to take her to her home. She is waiting for her to get off of work. She reports she did talk with daughter this am and it is ok right now, she let her know her sister is coming to pick her up. Will make sure she is out of that environment and with another family member. Pt reports her 39 yo could go home with her sister bu there is no food in the house, it is all in pt's Mom's house. Will work on pt's discharge plans and change the plan to her home. Mom suppose to be coming to visit today this worker will meet Her and discuss plans. Will work on plans, pt is calm and feels ok about the situation at this time. She does not feel her daughter is in danger and will be glad when daughter's sister takes her to her home.

## 2015-09-13 NOTE — Progress Notes (Signed)
Occupational Therapy Session Note  Patient Details  Name: Debra AgarStephanie Awwad MRN: [id number] Date of Birth: 06/08/1975  Today's Date: 09/13/2015 OT Individual Time: [phone] OT Individual Time Calculation (min): 50  min    Short Term Goals: Week 1:  OT Short Term Goal 1 (Week 1): STGs=LTGs  Skilled Therapeutic Interventions/Progress Updates:    Pt seen for skilled OT to facilitate standing balance, activity tolerance with ADL retraining. Reviewed pt's discharge plans that pt will not be going to her mother's. She will return home to 2 level home with only bathroom on 2nd floor. [facility] and that she will bathe at kitchen sink. In room pt worked on sit to stand and standing balance at sink.  Pt demonstrated good balance in standing with one hand supporting balance on counter top.  Discussed with pt need to practice w/c pushups several times a day to build strength.   Therapy Documentation Precautions:  Precautions Precautions: Fall Precaution Comments: R BKA Restrictions Weight Bearing Restrictions: Yes RLE Weight Bearing: Non weight bearing  Pain: Pain Assessment Pain Assessment: 0-10 Pain Score: 6  Pain Location: Leg Pain Intervention(s):  (premedicated) ADL:   See Function Navigator for Current Functional Status.   Therapy/Group: Individual Therapy  SAGUIER,JULIA 09/13/2015, 12:21 PM

## 2015-09-13 NOTE — Progress Notes (Signed)
[facility] PHYSICAL MEDICINE & REHABILITATION     PROGRESS NOTE  Subjective/Complaints: Pt laying in bed.  She states she is sore from therapies.  Overall, she is doing well, but wishes it wouldn't feel uncomfortable.  ROS: +Stump discomfort. Denies CP, SOB, N/V/D.  Objective: Vital Signs: Blood pressure 104/52, pulse 81, temperature 98.6 F (37 C), temperature source Oral, resp. rate 20, weight 102.694 kg (226 lb 6.4 oz), last menstrual period 08/22/2015, SpO2 98 %. No results found.  Recent Labs  09/11/15 0431  WBC 7.3  HGB 12.7  HCT 38.2  PLT 573*    Recent Labs  09/11/15 0431  NA 138  K 4.0  CL 99*  GLUCOSE 169*  BUN 6  CREATININE 0.47  CALCIUM 9.4   CBG (last 3)   Recent Labs  09/12/15 1620 09/12/15 2050 09/13/15 0701  GLUCAP 188* 142* 140*    Wt Readings from Last 3 Encounters:  09/10/15 102.694 kg (226 lb 6.4 oz)  09/06/15 116.121 kg (256 lb)  08/29/15 116.121 kg (256 lb)    Physical Exam:  BP 104/52 mmHg  Pulse 81  Temp(Src) 98.6 F (37 C) (Oral)  Resp 20  Wt 102.694 kg (226 lb 6.4 oz)  SpO2 98%  LMP 08/22/2015 Constitutional: She appears well-developed and well-nourished.  HENT: Normocephalic and atraumatic.  Eyes: Conjunctivae and EOM are normal.  Cardiovascular: Normal rate and regular rhythm.  Respiratory: Effort normal and breath sounds normal. No respiratory distress. She has no wheezes.  GI: Soft. Bowel sounds are normal. She exhibits no distension. There is no tenderness.  Musculoskeletal: She exhibits edema and tenderness.  Tender to touch and had difficulty fully extending at knee without pain.  Neurological: She is alert and oriented to person, place, and time.  Able to follow simple motor commands without difficulty.  Motor: B/l UE, LLE: 5/5 proximal to distal RLE: 4/5 hip flexion (pain inhibition)  Skin: Skin is warm and dry.  R-BKA with moderate edema, staples c/d/i without erythema.  Psychiatric: Her speech is  normal. Thought content normal. Her mood appears anxious. She is slowed. Cognition and memory are normal.   Assessment/Plan: 1. Functional deficits secondary to right BKA which require 3+ hours per day of interdisciplinary therapy in a comprehensive inpatient rehab setting. Physiatrist is providing close team supervision and 24 hour management of active medical problems listed below. Physiatrist and rehab team continue to assess barriers to discharge/monitor patient progress toward functional and medical goals.  Function:  Bathing Bathing position   Position: Wheelchair/chair at sink  Bathing parts Body parts bathed by patient: Right arm, Left arm, Chest, Abdomen, Front perineal area, Buttocks, Right upper leg, Left upper leg, Right lower leg, Left lower leg, Back Body parts bathed by helper: Back  Bathing assist Assist Level: Touching or steadying assistance(Pt > 75%)      Upper Body Dressing/Undressing Upper body dressing   What is the patient wearing?: Pull over shirt/dress     Pull over shirt/dress - Perfomed by patient: Thread/unthread right sleeve, Thread/unthread left sleeve, Put head through opening, Pull shirt over trunk          Upper body assist Assist Level: Supervision or verbal cues      Lower Body Dressing/Undressing Lower body dressing   What is the patient wearing?: Underwear, Pants, Non-skid slipper socks Underwear - Performed by patient: Thread/unthread right underwear leg, Thread/unthread left underwear leg   Pants- Performed by patient: Thread/unthread right pants leg, Thread/unthread left pants leg, Pull pants up/down  Non-skid slipper socks- Performed by patient: Don/doff left sock                    Lower body assist Assist for lower body dressing: Touching or steadying assistance (Pt > 75%)      Toileting Toileting   Toileting steps completed by patient: Adjust clothing prior to toileting, Performs perineal hygiene, Adjust clothing after  toileting   Toileting Assistive Devices: Grab bar or rail  Toileting assist Assist level: Supervision or verbal cues   Transfers Chair/bed transfer   Chair/bed transfer method: Squat pivot Chair/bed transfer assist level: Touching or steadying assistance (Pt > 75%) Chair/bed transfer assistive device: Armrests     Locomotion Ambulation     Max distance: 54ft Assist level: Touching or steadying assistance (Pt > 75%)   Wheelchair   Type: Manual Max wheelchair distance: 128ft Assist Level: Supervision or verbal cues  Cognition Comprehension Comprehension assist level: Follows complex conversation/direction with extra time/assistive device  Expression Expression assist level: Expresses complex ideas: With extra time/assistive device  Social Interaction Social Interaction assist level: Interacts appropriately with others with medication or extra time (anti-anxiety, antidepressant).  Problem Solving Problem solving assist level: Solves basic 90% of the time/requires cueing < 10% of the time  Memory Memory assist level: Recognizes or recalls 90% of the time/requires cueing < 10% of the time    Medical Problem List and Plan: 1.  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  Cont CIR 2.  DVT Prophylaxis/Anticoagulation: Pharmaceutical: Lovenox 3. Pain Management: Oxycodone prn.   Neurontin for neuropathy increased to 100 TID.   Re-educated on need to limit narcotics.   4. Mood: LCSW to follow for evaluation and support.   5. Neuropsych: This patient is capable of making decisions on her own behalf. 6. Skin/Wound Care: Monitor wound daily for healing. Maintain adequate nutrition and hydration status.   7. Fluids/Electrolytes/Nutrition: Monitor I/O.   BMP within acceptable range on 7/11 8. HTN:  Will monitor BP bid. Continue lisinopril daily.   9.T2DM: Poorly controlled with A1c-12.1. Has been on medications for past 6 years and reports compliance.   Changed glucotrol to amaryl as at  home.   Continue metformin bid, increased to  on 7/12.   Lantus 25U daily.   Novolog 4U TID, increased to 6U TID on 7/12   Will cont to monitor, improving 10. GERD: Pepcid.   11. PAD: Cont meds 12. Hyponatremia: Resolved  Will cont to monitor 13. Morbid Obesity: Body mass index is 36.73 kg/(m^2). Diet and exercise education.  Encourage weight loss to increase endurance and promote overall health 14. Tobacco abuse: Counsel  LOS (Days) 3 A FACE TO FACE EVALUATION WAS PERFORMED  Ankit Karis Juba 09/13/2015 10:30 AM

## 2015-09-13 NOTE — Progress Notes (Signed)
Recreational Therapy Assessment and Plan  Patient Details  Name: Debra [name] MRN: [id number] Date of Birth: 07-May-1975 Today's Date: 09/13/2015  Rehab Potential: Good ELOS: 10 days   Assessment Clinical Impression:  Problem List:  Patient Active Problem List   Diagnosis Date Noted  . Unilateral complete BKA ([place]) 09/10/2015  . Status post below knee amputation of right lower extremity ([place])   . Phantom limb pain ([place])   . Diabetes mellitus type 2 in obese ([place])   . Benign essential HTN   . Tobacco abuse   . PAD (peripheral artery disease) ([place])   . Tachycardia   . Leukocytosis   . Hyponatremia   . Post-operative pain   . Hyperkalemia   . Obesity   . Ischemia of extremity 09/06/2015  . Atherosclerotic peripheral vascular disease ([place]) 05/28/2015  . Abscess and cellulitis 11/05/2014  . Diabetes ([place]) 11/05/2014  . Sepsis ([place]) 11/05/2014    Past Medical History:  Past Medical History  Diagnosis Date  . Diabetes mellitus without complication ([place])   . Back pain   . Abscess   . Hyperlipidemia   . Hypertension   . Peripheral vascular disease ([place])   . Bronchitis   . Pneumonia   . Goiter   . Anxiety   . Depression   . GERD (gastroesophageal reflux disease)   . Headache     migraines   Past Surgical History:  Past Surgical History  Procedure Laterality Date  . Peripheral vascular catheterization N/A 05/28/2015    Procedure: Abdominal Aortogram; Surgeon: Angelia Mould, MD; Location: [facility]; Service: Cardiovascular; Laterality: N/A;  . 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 Amputation Right 09/06/2015    Procedure: AMPUTATION BELOW KNEE; Surgeon: Angelia Mould, MD; Location[facility]; Service: Vascular; Laterality: Right;    Assessment & Plan Clinical Impression: Patient is a 40 y.o. year old  female with history of T2DM, HTN, ongoing tobacco use, coolness bilateral feet with evidence of PAD who developed progressive gangrenous changes right foot with rest pain and ischemia. She failed conservative treatment. 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 Scot Dock. She has associated phantom limb pain. Post op therapy evaluations recommending CIR. Patient transferred to CIR on 09/10/2015.      Pt presents with decreased activity tolerance, decreased functional mobility, decreased balance Limiting pt's independence with leisure/community pursuits.   Leisure History/Participation Premorbid leisure interest/current participation: Medical laboratory scientific officer - Building control surveyor - Doctor, hospital - Travel (Comment);Crafts - Painting;Crafts - Other (Comment);Crafts - Woodworking Expression Interests: Music (Comment) Other Leisure Interests: Television;Movies Leisure Participation Style: Alone;With Family/Friends Awareness of Community Resources: Good-identify 3 post discharge leisure resources Psychosocial / Spiritual Social interaction - Mood/Behavior: Cooperative Academic librarian Appropriate for Education?: Yes Recreational Therapy Orientation Orientation -Reviewed with patient: Available activity resources Strengths/Weaknesses Patient Strengths/Abilities: Willingness to participate Patient weaknesses: Physical limitations TR Patient demonstrates impairments in the following area(s): Edema;Endurance;Pain;Safety;Sensory;Skin Integrity  Plan Rec Therapy Plan Is patient appropriate for Therapeutic Recreation?: Yes Rehab Potential: Good Treatment times per week: Min 1 time >20 minutes Estimated Length of Stay: 10 days TR Treatment/Interventions: Adaptive equipment instruction;1:1 session;Balance/vestibular training;Functional mobility training;Community reintegration;Leisure education;Patient/family education;Therapeutic activities;Recreation/leisure participation;Group participation  (Comment);Therapeutic exercise;UE/LE Coordination activities;Wheelchair propulsion/positioning  Recommendations for other services: None  Discharge Criteria: Patient will be discharged from TR if patient refuses treatment 3 consecutive times without medical reason.  If treatment goals not met, if there is a change in medical status, if patient makes no progress towards goals or if patient is  discharged from hospital.  The above assessment, treatment plan, treatment alternatives and goals were discussed and mutually agreed upon: by patient  [place] 09/13/2015, 3:18 PM

## 2015-09-13 NOTE — Progress Notes (Signed)
Occupational Therapy Session Note  Patient Details  Name: Debra AgarStephanie Grist MRN: [id number] Date of Birth: 02/15/1976  Today's Date: 09/13/2015 OT Individual Time: 1300-1400 OT Individual Time Calculation (min): 60 min    Short Term Goals: Week 1:  OT Short Term Goal 1 (Week 1): STGs=LTGs  Skilled Therapeutic Interventions/Progress Updates:    1:1 Therapeutic activity to focus on hip extension and strengthening, transfer training and UB strengthening and conditioning. In supine pt able to tolerate prolonged stretch in prone position. Pt then able to perform hip extension exercises with cues for proper posture and alignment. Pt transitioned to EOB and able to perform squats around the edge of mat 2x with focus on achieving high bottom clearance and maintaining positioning enough to move self down the mat. Stand pivot transfers with RW with supervision with extra time. Pt able to ambulate to w/c with RW with steadying A to supervision. UE exercises with 3 lb weight in all planes and mm groups.   Therapy Documentation Precautions:  Precautions Precautions: Fall Precaution Comments: R BKA Restrictions Weight Bearing Restrictions: Yes RLE Weight Bearing: Non weight bearing Pain:  no c/o pain   See Function Navigator for Current Functional Status.   Therapy/Group: Individual Therapy/ co treat withTR  Roney MansSmith, Tayshaun Kroh [facility] 09/13/2015, 3:51 PM

## 2015-09-13 NOTE — Progress Notes (Signed)
Physical Therapy Session Note  Patient Details  Name: Debra AgarStephanie [name] MRN: [id number] Date of Birth: 06/06/1975  Today's Date: 09/13/2015 PT Individual Time: 1417-1530 PT Individual Time Calculation (min): 73 min   Short Term Goals: Week 1:  PT Short Term Goal 1 (Week 1): STG = LTG due to short ELOS.  Skilled Therapeutic Interventions/Progress Updates:    [facility].   PT instructed patient WC mobility in controlled environment for 15650ft, 2600ft, and 2375ftx 2. Min cues from PT for doorway management and improved turns in tight spaces  Step training in Parallel bars on 6 inch step x 5 with min A from PT and mod cues for improved push through BUE. Patient demonstrates excessive use of sound LE for hop to pattern on ascent, but proper swing to technique with descent.  Stair training on 3 inch height x 16 stesp with min A from PT and mod cues for improved use of BUE.   WC mobility on sidewalk for 1100ft with supervision A and min cues for improved control of WC to prevent loss of control on downhill grade.   Gait training with RW for 3255ft, 8860ft in controlled environment with min A from PT  Gait in gift shop for simulated community environment for 4445ft with RW with min A from PT For all gait training PT provided mod cues for improved push through BUE and increase swing-to pattern to allow improved control and decreased force on sound LE with each step.   Patient [facility] to [facility] and [facility] in reach.      Therapy Documentation Precautions:  Precautions Precautions: Fall Precaution Comments: R BKA Restrictions Weight Bearing Restrictions: Yes RLE Weight Bearing: Non weight bearing    Vital Signs: Therapy Vitals Temp: 98.5 F (36.9 C) Temp Source: Oral Pulse Rate: 85 Resp: 18 BP: 110/61 mmHg Patient Position (if appropriate): Sitting Oxygen Therapy SpO2: 99 % O2 Device: Not Delivered   PAIN: 0/10    See Function  Navigator for Current Functional Status.   Therapy/Group: Individual Therapy  Golden Popustin E Tiffny Gemmer 09/13/2015, 4:56 PM

## 2015-09-13 NOTE — Progress Notes (Addendum)
Occupational Therapy Session Note  Patient Details  Name: Debra [name] MRN: [id number] Date of Birth: 09/15/1975  Today's Date: 09/13/2015 OT Individual Time: [id number] OT Individual Time Calculation (min): 28 min     MAKE UP SESSION   Short Term Goals: Week 1:  OT Short Term Goal 1 (Week 1): STGs=LTGs  Skilled Therapeutic Interventions/Progress Updates:    Pt completed simulated tub transfers with supervision using the RW to the tub bench.  She was able to ambulate from the ADL apartment into the tub room to complete this with only close supervision.  Next educated pt on simple meal prep from wheelchair level including standing at the countertop with supervision to retrieve items from the upper cabinet with supervision.  Discussed planning ahead so everything can be moved to convenient areas for easier access.  Pt able to place items in and out of the oven safely with only min instructional cueing to remember to lock the wheelchair brakes when reaching down to the floor.  Finished session with education on wrapping her residual limb with therapist performing re-wrap.  Pt would benefit from practice with actual meal prep in the kitchen as well as practice with limb wrapping during session.    Therapy Documentation Precautions:  Precautions Precautions: Fall Precaution Comments: R BKA Restrictions Weight Bearing Restrictions: Yes RLE Weight Bearing: Non weight bearing  Pain: Pain Assessment Pain Assessment: No/denies pain Pain Score: 6  Pain Location: Leg Pain Intervention(s):  (premedicated) ADL: See Function Navigator for Current Functional Status.   Therapy/Group: Individual Therapy  Airen Stiehl OTR/L 09/13/2015, 12:27 PM

## 2015-09-13 NOTE — Progress Notes (Signed)
  Progress Note    09/13/2015 9:28 AM * No surgery found *  Subjective:  Says she still has tightness.  Says she is doing well with rehab  Afebrile   Filed Vitals:   09/12/15 1456 09/13/15 0505  BP: 154/85 104/52  Pulse: 98 81  Temp: 98.3 F (36.8 C) 98.6 F (37 C)  Resp: 16 20    Physical Exam: Incisions:  Clean and dry with staples in tact. Extremities:  Moving right leg well.  CBC    Component Value Date/Time   WBC 7.3 09/11/2015 0431   RBC 4.20 09/11/2015 0431   HGB 12.7 09/11/2015 0431   HCT 38.2 09/11/2015 0431   PLT 573* 09/11/2015 0431   MCV 91.0 09/11/2015 0431   MCH 30.2 09/11/2015 0431   MCHC 33.2 09/11/2015 0431   RDW 11.6 09/11/2015 0431   LYMPHSABS 2.4 09/11/2015 0431   MONOABS 0.6 09/11/2015 0431   EOSABS 0.1 09/11/2015 0431   BASOSABS 0.0 09/11/2015 0431    BMET    Component Value Date/Time   NA 138 09/11/2015 0431   K 4.0 09/11/2015 0431   CL 99* 09/11/2015 0431   CO2 30 09/11/2015 0431   GLUCOSE 169* 09/11/2015 0431   BUN 6 09/11/2015 0431   CREATININE 0.47 09/11/2015 0431   CALCIUM 9.4 09/11/2015 0431   GFRNONAA >60 09/11/2015 0431   GFRAA >60 09/11/2015 0431    INR No results found for: INR   Intake/Output Summary (Last 24 hours) at 09/13/15 0928 Last data filed at 09/13/15 0500  Gross per 24 hour  Intake    480 ml  Output      0 ml  Net    480 ml     Assessment/Plan:  40 y.o. female is s/p right below knee amputation   7 Day's Post-Op   -stump is viable-is clean and dry with staples in tact.  -pt doing well with rehab -f/u with Dr. Edilia Boickson in 4 weeks.  (appointment is 8/2 @ 11am)   Doreatha MassedSamantha Azariah Bonura, PA-C [facility] [phone] 09/13/2015 9:28 AM

## 2015-09-14 ENCOUNTER — Inpatient Hospital Stay (HOSPITAL_COMMUNITY): Payer: Medicaid Other | Admitting: Physical Therapy

## 2015-09-14 ENCOUNTER — Inpatient Hospital Stay (HOSPITAL_COMMUNITY): Payer: Medicaid Other | Admitting: Occupational Therapy

## 2015-09-14 ENCOUNTER — Inpatient Hospital Stay (HOSPITAL_COMMUNITY): Payer: Medicaid Other | Admitting: *Deleted

## 2015-09-14 LAB — GLUCOSE, CAPILLARY
GLUCOSE-CAPILLARY: 99 mg/dL (ref 65–99)
GLUCOSE-CAPILLARY: 107 mg/dL — AB (ref 65–99)
Glucose-Capillary: 90 mg/dL (ref 65–99)
Glucose-Capillary: 148 mg/dL — ABNORMAL HIGH (ref 65–99)

## 2015-09-14 NOTE — Progress Notes (Signed)
Physical Therapy Session Note  Patient Details  Name: Debra [name] MRN: [id number] Date of Birth: 01/28/1976  Today's Date: 09/14/2015 PT Concurrent Time: 0907-1030 PT Concurrent Time Calculation (min): 83 min  Short Term Goals: Week 1:  PT Short Term Goal 1 (Week 1): STG = LTG due to short ELOS.  Skilled Therapeutic Interventions/Progress Updates:    [facility] to PT with 10/10 pain, and states that she recently received pain medication. Patient transported to rehab gym for energy conservation and to allow pain medication to take therapeutic effect.  Stand pivot transfer to mat table [facility] with supervision A following by bed mobility for sit>supine with supervision A. Patient instructed in supine therex for SLR, Hip abduction, and knee flexion/extension. Patient performed supine>prone with supervision A and min cues for improved ease of transfer and proper UE placement. In Prone patient performed hip extension with min cues to prevent trunk rotation improve quality of movement. Patient performed bed mobility to back to EOB with supervision A from therapist with min cues for safety and Limb protection.   Patient performed sit<>stand x 8 from EOB with RW and supervision A. Min cues to prevent locking sound knee against table to improve strengthening aspect of movement.   Gait training for 355ftx 2 with RW mod cues to improve use of UE to decrease force of heel contact on sound LE.  Stair training. Patient able to performed 1 6 inch step on stair, but refused to attempt more due to fear of falling. Stair training continued for up/down 6 inch step in parallel bars x 15 with min A from PT. Patient demonstrated improved use of BUE with push through rails for ascent compared to previous trials.   Nustep level 4-5 x 10 minutes for LLE endurance and strengthening with min cues for improve SPM >40.   Throughout treatment patient performed squat pivot to various seat  heights x 6 with supervision A from PT with min cues for proper UE placement to allow safe transfer.   Patient returned to room and left [facility] with call bell within reach.    Therapy Documentation Precautions:  Precautions Precautions: Fall Precaution Comments: R BKA Restrictions Weight Bearing Restrictions: Yes RLE Weight Bearing: Non weight bearing General: PT Amount of Missed Time (min): 7 Minutes PT Missed Treatment Reason: Nursing care Vital Signs: Therapy Vitals Temp: 98.4 F (36.9 C) Temp Source: Oral Pulse Rate: 95 Resp: 16 BP: (!) 106/56 mmHg Patient Position (if appropriate): Lying Oxygen Therapy SpO2: 100 % O2 Device: Not Delivered Pain: Pain Assessment Pain Score: 10/10 at start of treatment. 4/10 at end of treatment.    See Function Navigator for Current Functional Status.   Therapy/Group: Individual Therapy  Golden Popustin E Daisey Caloca 09/14/2015, 5:25 PM

## 2015-09-14 NOTE — Progress Notes (Signed)
Orthopedic Tech Progress Note Patient Details:  Debra [name] 03/06/1975 [id number]  Patient ID: Debra AgarStephanie [name], female   DOB: 05/30/1975, 40 y.o.   MRN: [id number] Called [employer] brace order; spoke with Debra [name]  [name] 09/14/2015, 11:35 AM

## 2015-09-14 NOTE — Progress Notes (Signed)
[place] PHYSICAL MEDICINE & REHABILITATION     PROGRESS NOTE  Subjective/Complaints: Pt laying in bed.  She complains of stump discomfort and states she is just waiting for it to heal.  She has trouble getting comfortable at night as a result.    ROS: +Stump discomfort. Denies CP, SOB, N/V/D.  Objective: Vital Signs: Blood pressure 118/66, pulse 92, temperature 97.9 F (36.6 C), temperature source Oral, resp. rate 18, weight 102.694 kg (226 lb 6.4 oz), last menstrual period 08/22/2015, SpO2 99 %. No results found. No results for input(s): WBC, HGB, HCT, PLT in the last 72 hours. No results for input(s): NA, K, CL, GLUCOSE, BUN, CREATININE, CALCIUM in the last 72 hours.  Invalid input(s): CO CBG (last 3)   Recent Labs  09/13/15 1700 09/13/15 2032 09/14/15 0646  GLUCAP 109* 114* 99    Wt Readings from Last 3 Encounters:  09/10/15 102.694 kg (226 lb 6.4 oz)  09/06/15 116.121 kg (256 lb)  08/29/15 116.121 kg (256 lb)    Physical Exam:  BP 118/66 mmHg  Pulse 92  Temp(Src) 97.9 F (36.6 C) (Oral)  Resp 18  Wt 102.694 kg (226 lb 6.4 oz)  SpO2 99%  LMP 08/22/2015 Constitutional: She appears well-developed and well-nourished.  HENT: Normocephalic and atraumatic.  Eyes: Conjunctivae and EOM are normal.  Cardiovascular: Normal rate and regular rhythm.  Respiratory: Effort normal and breath sounds normal. No respiratory distress. She has no wheezes.  GI: Soft. Bowel sounds are normal. She exhibits no distension. There is no tenderness.  Musculoskeletal: She exhibits edema and tenderness.  Neurological: She is alert and oriented to person, place, and time.  Able to follow simple motor commands without difficulty.  Motor: B/l UE, LLE: 5/5 proximal to distal RLE: 4+/5 hip flexion (pain inhibition)  Skin: Skin is warm and dry.  R-BKA with moderate edema, staples c/d/i without erythema.  Psychiatric: Her speech is normal. Thought content normal. Her mood appears anxious.  She is slowed. Cognition and memory are normal.   Assessment/Plan: 1. Functional deficits secondary to right BKA which require 3+ hours per day of interdisciplinary therapy in a comprehensive inpatient rehab setting. Physiatrist is providing close team supervision and 24 hour management of active medical problems listed below. Physiatrist and rehab team continue to assess barriers to discharge/monitor patient progress toward functional and medical goals.  Function:  Bathing Bathing position   Position: Wheelchair/chair at sink  Bathing parts Body parts bathed by patient: Right arm, Left arm, Chest, Abdomen, Front perineal area, Buttocks, Right upper leg, Left upper leg, Left lower leg, Back Body parts bathed by helper: Back  Bathing assist Assist Level: Supervision or verbal cues      Upper Body Dressing/Undressing Upper body dressing   What is the patient wearing?: Pull over shirt/dress     Pull over shirt/dress - Perfomed by patient: Thread/unthread right sleeve, Thread/unthread left sleeve, Put head through opening, Pull shirt over trunk          Upper body assist Assist Level: Supervision or verbal cues      Lower Body Dressing/Undressing Lower body dressing   What is the patient wearing?: Underwear, Pants, Non-skid slipper socks Underwear - Performed by patient: Thread/unthread right underwear leg, Thread/unthread left underwear leg, Pull underwear up/down   Pants- Performed by patient: Thread/unthread right pants leg, Thread/unthread left pants leg, Pull pants up/down   Non-skid slipper socks- Performed by patient: Don/doff left sock  Lower body assist Assist for lower body dressing: Supervision or verbal cues      Toileting Toileting   Toileting steps completed by patient: Adjust clothing prior to toileting, Performs perineal hygiene, Adjust clothing after toileting   Toileting Assistive Devices: Grab bar or rail  Toileting assist Assist  level: Supervision or verbal cues   Transfers Chair/bed transfer   Chair/bed transfer method: Squat pivot Chair/bed transfer assist level: Touching or steadying assistance (Pt > 75%) Chair/bed transfer assistive device: Armrests     Locomotion Ambulation     Max distance: 2550ft Assist level: Touching or steadying assistance (Pt > 75%)   Wheelchair   Type: Manual Max wheelchair distance: 11950ft Assist Level: Supervision or verbal cues  Cognition Comprehension Comprehension assist level: Follows complex conversation/direction with extra time/assistive device  Expression Expression assist level: Expresses complex ideas: With extra time/assistive device  Social Interaction Social Interaction assist level: Interacts appropriately with others with medication or extra time (anti-anxiety, antidepressant).  Problem Solving Problem solving assist level: Solves basic 90% of the time/requires cueing < 10% of the time  Memory Memory assist level: Recognizes or recalls 90% of the time/requires cueing < 10% of the time    Medical Problem List and Plan: 1.  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  Cont CIR  Stump protector ordered for helping pt keep stump straight 2.  DVT Prophylaxis/Anticoagulation: Pharmaceutical: Lovenox 3. Pain Management: Oxycodone prn.   Neurontin for neuropathy increased to 100 TID.   Re-educated on need to limit narcotics.   4. Mood: LCSW to follow for evaluation and support.   5. Neuropsych: This patient is capable of making decisions on her own behalf. 6. Skin/Wound Care: Monitor wound daily for healing. Maintain adequate nutrition and hydration status.   7. Fluids/Electrolytes/Nutrition: Monitor I/O.   BMP within acceptable range on 7/11 8. HTN:  Will monitor BP bid. Continue lisinopril daily.   9.T2DM: Poorly controlled with A1c-12.1. Has been on medications for past 6 years and reports compliance.   Changed glucotrol to amaryl as at home.   Continue metformin  bid, increased to 1000mg  on 7/12.   Lantus 25U daily.   Novolog 4U TID, increased to 6U TID on 7/12   Will cont to monitor, improved 10. GERD: Pepcid.   11. PAD: Cont meds 12. Hyponatremia: Resolved  Will cont to monitor 13. Morbid Obesity: Body mass index is 36.73 kg/(m^2). Diet and exercise education.  Encourage weight loss to increase endurance and promote overall health 14. Tobacco abuse: Counsel  LOS (Days) 4 A FACE TO FACE EVALUATION WAS PERFORMED  Zadrian Mccauley Karis Jubanil Tymeshia Awan 09/14/2015 10:00 AM

## 2015-09-14 NOTE — Progress Notes (Signed)
Orthopedic Tech Progress Note Patient Details:  Debra AgarStephanie [name] 03/23/1975 [id number]  Patient ID: Debra [name], female   DOB: 04/15/1975, 40 y.o.   MRN: [id number] Called in bio-tech brace order; spoke with Debra [name]  Jack Bolio 09/14/2015, 10:50 AM

## 2015-09-14 NOTE — Progress Notes (Signed)
Physical Therapy Session Note  Patient Details  Name: Debra [name] MRN: [id number] Date of Birth: 06/27/1975  Today's Date: 09/14/2015 PT Individual Time: 4098-11911030-1115 PT Individual Time Calculation (min): 45 min   Short Term Goals: Week 1:  PT Short Term Goal 1 (Week 1): STG = LTG due to short ELOS.  Skilled Therapeutic Interventions/Progress Updates:    Pt received supine in bed, denies pain and agreeable to treatment. Supine>sit with modI, bedrails and HOB elevated. Transfers squat pivot x4 during session with S; pt able to manage leg rests and correctly position w/c for safe transfers without cueing. Sitting balance with mat table elevated to eliminate LE support while engaged in zoomball for BUE strengthening and postural alignment with cues for scapular retraction and chest expansion; 3x90 sec with rest breaks between. 3x10 modified situps with wedge and stability ball toss with therapist; performed for core strengthening for carryover into bed mobility. W/c propulsion to/from room x150' with modI.   Therapy Documentation Precautions:  Precautions Precautions: Fall Precaution Comments: R BKA Restrictions Weight Bearing Restrictions: Yes RLE Weight Bearing: Non weight bearing   See Function Navigator for Current Functional Status.   Therapy/Group: Individual Therapy  Vista Lawmanlizabeth J Tygielski 09/14/2015, 12:21 PM

## 2015-09-14 NOTE — Progress Notes (Signed)
Occupational Therapy Session Note  Patient Details  Name: Debra AgarStephanie Vogler MRN: [id number] Date of Birth: 03/26/1975  Today's Date: 09/14/2015 OT Individual Time: [id number] OT Individual Time Calculation (min): 76 min    Short Term Goals: Week 1:  OT Short Term Goal 1 (Week 1): STGs=LTGs  Skilled Therapeutic Interventions/Progress Updates:    Pt participated in skilled OT session of bathing/dressing after lunch. Daughter was present and asleep in the recliner. Pt was agreeable to get up for tx. No pain reported, but discomfort with ACE wraps. ACE re-wrapped for pt comfort. Pt completed functional transfer to toilet from bed with RW and supervision with min cuing for proper hand placement. Pt completed toileting with supervision, using toilet bars instead of grab bars to simulate home setup. Pt reported still needing to call landlord to coordinate home modifications (i.e. Grab bars). Pt reported that she will complete today, and will follow up tomorrow. Pt retrieved ADL items with 1 cue w/c level. UB dressing completed with supervision for 1 cue for w/c safety. LB ADLs completed with supervision standing at sink with unilateral support. Pt afterwards self propelled to therapy apartment to work on balance. Pt participated in light housekeeping tasks of wiping down countertops with RW while changing directions, and required mod vcs on proper RW placement. Problem solving occurred with home microwave and safe methods for using. Daughter was present during education with verbalized understanding. Longest standing time without rest or LOB: 4 minutes. Pt was able to provide teach back for previous kitchen-based OT session for safe meal prep and kitchen navigation w/c level. Pt returned to room with daughter and all needs within reach. Pt expressed interest in laundry completion during next session with provided laundry bag for room.   Therapy Documentation Precautions:  Precautions Precautions:  Fall Precaution Comments: R BKA Restrictions Weight Bearing Restrictions: Yes RLE Weight Bearing: Non weight bearing   Vital Signs: Therapy Vitals Temp: 98.4 F (36.9 C) Temp Source: Oral Pulse Rate: 95 Resp: 16 BP: (!) 106/56 mmHg Patient Position (if appropriate): Lying Oxygen Therapy SpO2: 100 % O2 Device: Not Delivered     See Function Navigator for Current Functional Status.   Therapy/Group: Individual Therapy  Sakiyah Shur A Denham Mose 09/14/2015, 4:27 PM

## 2015-09-15 ENCOUNTER — Inpatient Hospital Stay (HOSPITAL_COMMUNITY): Payer: Medicaid Other | Admitting: Physical Therapy

## 2015-09-15 ENCOUNTER — Inpatient Hospital Stay (HOSPITAL_COMMUNITY): Payer: Medicaid Other | Admitting: Occupational Therapy

## 2015-09-15 DIAGNOSIS — S88112D Complete traumatic amputation at level between knee and ankle, left lower leg, subsequent encounter: Secondary | ICD-10-CM

## 2015-09-15 LAB — GLUCOSE, CAPILLARY
GLUCOSE-CAPILLARY: 138 mg/dL — AB (ref 65–99)
Glucose-Capillary: 114 mg/dL — ABNORMAL HIGH (ref 65–99)
Glucose-Capillary: 113 mg/dL — ABNORMAL HIGH (ref 65–99)
Glucose-Capillary: 75 mg/dL (ref 65–99)

## 2015-09-15 NOTE — Progress Notes (Signed)
Patient ID: Jobeth Pangilinan, female   DOB: 07-Jan-1976, 40 y.o.   MRN: [id number]  09/15/15.  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  Subjective/Complaints: Pt laying in bed.  She complains of stump discomfort and states she is just waiting for it to heal.  She feels that the stump site has become slightly more swollen.   ROS: +Stump discomfort. Denies CP, SOB, N/V/D.  Past Medical History  Diagnosis Date  . Diabetes mellitus without complication (HCC)   . Back pain   . Abscess   . Hyperlipidemia   . Hypertension   . Peripheral vascular disease (HCC)   . Bronchitis   . Pneumonia   . Goiter   . Anxiety   . Depression   . GERD (gastroesophageal reflux disease)   . Headache     migraines   Lab Results  Component Value Date   HGBA1C 12.1* 09/06/2015      BP Readings from Last 3 Encounters:  09/15/15 131/66  09/10/15 122/79  08/29/15 130/86      Objective: Vital Signs: Blood pressure 131/66, pulse 88, temperature 98.3 F (36.8 C), temperature source Oral, resp. rate 18, weight 226 lb 6.4 oz (102.694 kg), last menstrual period 08/22/2015, SpO2 100 %. No results found. No results for input(s): WBC, HGB, HCT, PLT in the last 72 hours. No results for input(s): NA, K, CL, GLUCOSE, BUN, CREATININE, CALCIUM in the last 72 hours.  Invalid input(s): CO CBG (last 3)   Recent Labs  09/14/15 1626 09/14/15 2124 09/15/15 0701  GLUCAP 90 148* 138*    Wt Readings from Last 3 Encounters:  09/10/15 226 lb 6.4 oz (102.694 kg)  09/06/15 256 lb (116.121 kg)  08/29/15 256 lb (116.121 kg)    Physical Exam:  BP 131/66 mmHg  Pulse 88  Temp(Src) 98.3 F (36.8 C) (Oral)  Resp 18  Wt 226 lb 6.4 oz (102.694 kg)  SpO2 100%  LMP 08/22/2015 Constitutional: She appears well-developed and well-nourished.  Gen.- Obese, no distress HENT: Normocephalic and atraumatic.   Eyes: Conjunctivae and EOM are normal.  Cardiovascular: Normal rate and regular rhythm.  Respiratory: Effort normal and breath sounds normal. No respiratory distress. She has no wheezes.  GI: Soft. Bowel sounds are normal. She exhibits no distension. There is no tenderness.  Musculoskeletal: Status post right BKA  Neurological: She is alert and oriented to person, place, and time.  Able to follow simple motor commands without difficulty.   Skin: Skin is warm and dry.  R-BKA with moderate edema, staples c/d/i without erythema.     Medical Problem List and Plan: 1.  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  Cont CIR  Stump protector ordered for helping pt keep stump straight 2.  DVT Prophylaxis/Anticoagulation: Pharmaceutical: Lovenox 3. Pain Management: Oxycodone prn.   Neurontin for neuropathy increased to 100 TID.   Re-educated on need to limit narcotics.    4. Skin/Wound Care: Monitor wound daily for healing. Maintain adequate nutrition and hydration status.    5. HTN:  Will monitor BP bid. Continue lisinopril daily.   6.T2DM: Poorly controlled with A1c-12.1. Has been on medications for past 6 years and reports compliance.   Changed glucotrol to amaryl as at home.   Continue metformin bid, increased to  on 7/12.   Lantus 25U daily.   Novolog 4U TID, increased to 6U TID on 7/12   Will cont  to monitor, improved    LOS (Days) 5 A FACE TO FACE EVALUATION WAS PERFORMED  Rogelia BogaKWIATKOWSKI,Osie Amparo FRANK 09/15/2015 10:33 AM

## 2015-09-15 NOTE — Progress Notes (Signed)
Occupational Therapy Session Note  Patient Details  Name: Debra AgarStephanie Ehler MRN: [id number] Date of Birth: 03/31/1975  Today's Date: 09/15/2015 OT Individual Time: [id number] OT Individual Time Calculation (min): 61 min    Short Term Goals: Week 1:  OT Short Term Goal 1 (Week 1): STGs=LTGs  Skilled Therapeutic Interventions/Progress Updates:    Pt was seen in morning session for IADL completion of laundry as well as instruction on self limb wrapping. Pt reported increased pain in residual limb upon skilled OT arrival and was premedicated at beginning of session. Pt completed functional transfer from bed to raised toilet seat with RW with supervision. Per report, pt has still not heard from landlord in regards to establishment of grab bars in home. Pt was provided education on using toilet bars in room instead of grab bars to simulate home environment. Pt verbalized understanding but still requires mod cuing to not use. Pt self propelled with daughter and therapist to laundry room. Pt was provided education on proper technique with pt standing as needed to load laundry with overall supervision. While laundry was being completed, pt and daughter were provided visual demonstrations of proper technique of wrapping residual limb. Daughter donned gloves and held gauze pads over staples while pt completed limb wrapping x2 occasions with Mod A and max vcs on technique. Pt continues to benefit from training. Pt left in room at end of session with daughter and all needs within reach and new knee extender donned. Pt reported that knee extender is to be donned at night and intermittently during the day.   2nd Session 1:1 tx (1343-1530) 47 minutes Pt seen in afternoon session for simple and complex meal preparation with daughter focusing on carryover of kitchen safety. Pt prepared various food items with oven and microwave at w/c level. Pt also made simple snacks standing as needed at countertop with mod vcs for  safety. Pt was able to carryover proper w/c placement at fridge, oven, and in front of drawers during item retrieval; pt still requires instructional cues for w/c safety as well as overall kitchen safety. Daughter frequently reinforces safety education with pt. While food was cooking in oven, pt completed limb wrapping with daughter. Pt completed with Mod A due to pt tendency to wrap in circular pattern. Pts daughter stabilized gauze pads during wrapping and verbalized understanding with assisting her mother with limb wrapping at time of discharge. Pt and daughter will benefit from continued education and training with ACE wraps. At end of tx pt left in room with daughter with all needs within reach.      Therapy Documentation Precautions:  Precautions Precautions: Fall Precaution Comments: R BKA Restrictions Weight Bearing Restrictions: Yes RUE Weight Bearing: Weight bearing as tolerated LUE Weight Bearing: Weight bearing as tolerated RLE Weight Bearing: Non weight bearing     See Function Navigator for Current Functional Status.   Therapy/Group: Individual Therapy  Novah Nessel A Kipp Shank 09/15/2015, 12:40 PM

## 2015-09-15 NOTE — Progress Notes (Signed)
Physical Therapy Session Note  Patient Details  Name: Debra [name] MRN: [id number] Date of Birth: 07/21/1975  Today's Date: 09/15/2015 PT Individual Time: 1400-1430 AND 1002-1100 PT Individual Time Calculation (min): 30 min AND 58 min   Short Term Goals: Week 1:  PT Short Term Goal 1 (Week 1): STG = LTG due to short ELOS.  Skilled Therapeutic Interventions/Progress Updates:    [facility] with total A for time management.  Patient performed gait/step training with BUE on 1 rail support in parallel bars to prepare access to second story in house. Gait with 1 rail support for 8910ft x 2 with min progressing to Supervision A and mod cues for Proper UE placement for increased step length. Step training on 1 sep 2inches in height up/down x 2. And step training on 4 inch step up/down x 2. PT provided Max verbal cues and min A for improve UE positioning and increased push through BUE to allow safe ascent of step.   PT instructed patient in gait training for 7860ft x 2 with supervision A and min cues for decreased force with heel contact on sound LE with improved push through BUE.  UE therex:  Press up and out on 5lb weight 3sets x 1 minute.  Low row with level 2 tband3x12 Middle row with level 2 tabnd x2 x 12 Mod multimodal cues to improved scapular movement and proper positioning to allow increased strengthening of movement.   [facility]   Session 2:  Patient received sitting EOB and agreeable to PT. PT instructed patient in squat pivot transfer to [facility] with supervision A with min cues for set up and propr UE placement. Patient able to place Bilat leg rests with min cues from PT.   [facility] in [facility] for 111050ft as well as through obstacle course to weave through 6 cones x 4 backwards. PT provided min A for improved turning technique to prevent  hitting obstacles and to proper ways to manage locating obstacles with posterior WC movement. WC mobility training also perfofmed up/down 10 ft ramp with close supervision A from PT with min cues for increased forward lean to prevent posterior tipping up incline.   Car transfer training with PT with min A. Patient self selected to performed stand pivot for entrance into car with support on car frame. PT provided max cues to prevent future transfers with this technique and educated that patient on the need to utilize RW for stand pivot transfer or to performed squat pivot technique. Patient verbalized understanding of safe transfer technique.   Patient left sitting in WC with call bell in reach.   Therapy Documentation Precautions:  Precautions Precautions: Fall Precaution Comments: R BKA Restrictions Weight Bearing Restrictions: Yes RUE Weight Bearing: Weight bearing as tolerated LUE Weight Bearing: Weight bearing as tolerated RLE Weight Bearing: Non weight bearing General:   Vital Signs: Therapy Vitals Temp: 98.1 F (36.7 C) Temp Source: Oral Pulse Rate: 85 Resp: 18 BP: 125/70 mmHg Patient Position (if appropriate): Sitting Oxygen Therapy SpO2: 100 % O2 Device: Not Delivered Pain:   4/10  See Function Navigator for Current Functional Status.   Therapy/Group: Individual Therapy  Golden Popustin E Retha Bither 09/15/2015, 5:45 PM

## 2015-09-16 ENCOUNTER — Inpatient Hospital Stay (HOSPITAL_COMMUNITY): Payer: Medicaid Other | Admitting: Occupational Therapy

## 2015-09-16 LAB — GLUCOSE, CAPILLARY
GLUCOSE-CAPILLARY: 141 mg/dL — AB (ref 65–99)
GLUCOSE-CAPILLARY: 84 mg/dL (ref 65–99)
Glucose-Capillary: 125 mg/dL — ABNORMAL HIGH (ref 65–99)
Glucose-Capillary: 109 mg/dL — ABNORMAL HIGH (ref 65–99)

## 2015-09-16 NOTE — Progress Notes (Signed)
Patient ID: Debra AgarStephanie [name], female   DOB: 12/26/1975, 40 y.o.   MRN: [id number]  Patient ID: Debra AgarStephanie Busbee, female   DOB: 04/23/1975, 40 y.o.   MRN: [id number]  09/16/15.  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  Subjective/Complaints:  Still some mild/moderate stump pain.  Seems worse at night but reasonable control with analgesics  ROS: +Stump discomfort. Denies CP, SOB, N/V/D.  Past Medical History  Diagnosis Date  . Diabetes mellitus without complication (HCC)   . Back pain   . Abscess   . Hyperlipidemia   . Hypertension   . Peripheral vascular disease (HCC)   . Bronchitis   . Pneumonia   . Goiter   . Anxiety   . Depression   . GERD (gastroesophageal reflux disease)   . Headache     migraines   Lab Results  Component Value Date   HGBA1C 12.1* 09/06/2015      BP Readings from Last 3 Encounters:  09/16/15 126/70  09/10/15 122/79  08/29/15 130/86      Objective: Vital Signs: Blood pressure 126/70, pulse 102, temperature 98.6 F (37 C), temperature source Oral, resp. rate 18, weight 226 lb 6.4 oz (102.694 kg), last menstrual period 08/22/2015, SpO2 99 %. No results found. No results for input(s): WBC, HGB, HCT, PLT in the last 72 hours. No results for input(s): NA, K, CL, GLUCOSE, BUN, CREATININE, CALCIUM in the last 72 hours.  Invalid input(s): CO CBG (last 3)   Recent Labs  09/15/15 1637 09/15/15 2045 09/16/15 0636  GLUCAP 113* 75 125*    Wt Readings from Last 3 Encounters:  09/10/15 226 lb 6.4 oz (102.694 kg)  09/06/15 256 lb (116.121 kg)  08/29/15 256 lb (116.121 kg)    Physical Exam:  BP 126/70 mmHg  Pulse 102  Temp(Src) 98.6 F (37 C) (Oral)  Resp 18  Wt 226 lb 6.4 oz (102.694 kg)  SpO2 99%  LMP 08/22/2015 Constitutional: She appears well-developed and well-nourished.  Gen.- Obese, no distress HENT: Normocephalic and  atraumatic.  Eyes: Conjunctivae and EOM are normal.  Cardiovascular: Normal rate and regular rhythm.  Respiratory: Effort normal and breath sounds normal. No respiratory distress. She has no wheezes.  GI: Soft. Bowel sounds are normal. She exhibits no distension. There is no tenderness.  Musculoskeletal: Status post right BKA  Neurological: She is alert and oriented to person, place, and time.    Skin: Skin is warm and dry.  R-BKA with moderate edema, staples c/d/i without erythema.     Medical Problem List and Plan: 1.  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  Cont CIR  Stump protector ordered for helping pt keep stump straight 2.  DVT Prophylaxis/Anticoagulation: Pharmaceutical: Lovenox 3. Pain Management: Oxycodone prn.   Neurontin for neuropathy increased to 100 TID.   Re-educated on need to limit narcotics.    4. Skin/Wound Care: Monitor wound daily for healing. Maintain adequate nutrition and hydration status.    5. HTN:  Will monitor BP bid. Continue lisinopril daily.   6.T2DM: Poorly controlled with A1c-12.1. Has been on medications for past 6 years and reports compliance.   Changed glucotrol to amaryl as at home.   Continue metformin bid, increased to 1000mg  on 7/12.   Lantus 25U daily.   Novolog 4U TID, increased to 6U TID on 7/12   Will cont to monitor, improved    LOS (Days) 6  A FACE TO FACE EVALUATION WAS PERFORMED  Rogelia Boga 09/16/2015 9:25 AM

## 2015-09-16 NOTE — Progress Notes (Signed)
Occupational Therapy Session Note  Patient Details  Name: Debra AgarStephanie Dax MRN: [id number] Date of Birth: 01/12/1976  Today's Date: 09/16/2015 OT Individual Time: 0901-1001 OT Individual Time Calculation (min): 60 min   Skilled Therapeutic Interventions/Progress Updates:   Skilled OT session today focused on self setup of ADLs in simulated home environment. Pre medicated at start of session. Pt reported not sleeping well last night due to phantom limb pain and feeling "grumpy." ADL items retrieved w/c level with supervision and instruction on home setup for washing up at kitchen sink at time of discharge. Pt reported planning on living on 1st floor of home with bed, closet space, kitchen, and BSC for toileting needs. Bathroom is accessible on 2nd floor of residence with 14-15 stairs to ascend. Pt reported no response from landlord with home modifications. Pt completed BADLs with supervision standing as needed sinkside. Toileting completed with supervision and BSC due to urgency. Pt put away clean clothes in drawers w/c level with 1 questioning cue for w/c safety. ACE wrapping of residual limb completed with Min-Mod A with max vcs on figure 8 technique and to have wrap wrinkle free. At end of session, pt was left with daughter to go to day room to get better cell phone reception.   Therapy Documentation Precautions:  Precautions Precautions: Fall Precaution Comments: R BKA Restrictions Weight Bearing Restrictions: Yes RUE Weight Bearing: Weight bearing as tolerated LUE Weight Bearing: Weight bearing as tolerated RLE Weight Bearing: Non weight bearing General:   Vital Signs: Therapy Vitals Temp: 98.3 F (36.8 C) Temp Source: Oral Pulse Rate: 90 Resp: 18 BP: (!) 107/52 mmHg Patient Position (if appropriate): Lying Oxygen Therapy SpO2: 98 % O2 Device: Not Delivered :    See Function Navigator for Current Functional Status.   Therapy/Group: Individual Therapy  Jeromey Kruer A  Denzal Meir 09/16/2015, 3:01 PM

## 2015-09-17 ENCOUNTER — Inpatient Hospital Stay (HOSPITAL_COMMUNITY): Payer: Medicaid Other | Admitting: Occupational Therapy

## 2015-09-17 ENCOUNTER — Encounter (HOSPITAL_COMMUNITY): Payer: Medicaid Other

## 2015-09-17 ENCOUNTER — Encounter (HOSPITAL_COMMUNITY): Payer: Medicaid Other | Admitting: Physical Therapy

## 2015-09-17 ENCOUNTER — Inpatient Hospital Stay (HOSPITAL_COMMUNITY): Payer: Medicaid Other | Admitting: Physical Therapy

## 2015-09-17 DIAGNOSIS — G479 Sleep disorder, unspecified: Secondary | ICD-10-CM

## 2015-09-17 DIAGNOSIS — F4322 Adjustment disorder with anxiety: Secondary | ICD-10-CM

## 2015-09-17 LAB — BASIC METABOLIC PANEL
Anion gap: 6 (ref 5–15)
BUN: 8 mg/dL (ref 6–20)
CO2: 27 mmol/L (ref 22–32)
CREATININE: 0.48 mg/dL (ref 0.44–1.00)
Calcium: 9.1 mg/dL (ref 8.9–10.3)
Chloride: 101 mmol/L (ref 101–111)
GFR calc Af Amer: >60 mL/min (ref >60)
Glucose, Bld: 134 mg/dL — ABNORMAL HIGH (ref 65–99)
POTASSIUM: 3.9 mmol/L (ref 3.5–5.1)
SODIUM: 134 mmol/L — AB (ref 135–145)

## 2015-09-17 LAB — CBC WITH DIFFERENTIAL/PLATELET
BASOS ABS: 0 10*3/uL (ref 0.0–0.1)
BASOS PCT: 0 %
EOS ABS: 0 10*3/uL (ref 0.0–0.7)
EOS PCT: 1 %
HCT: 36.7 % (ref 36.0–46.0)
Hemoglobin: 12.4 g/dL (ref 12.0–15.0)
Lymphocytes Relative: 40 %
Lymphs Abs: 3.1 10*3/uL (ref 0.7–4.0)
MCH: 30.2 pg (ref 26.0–34.0)
MCHC: 33.8 g/dL (ref 30.0–36.0)
MCV: 89.5 fL (ref 78.0–100.0)
Monocytes Absolute: 0.5 10*3/uL (ref 0.1–1.0)
Monocytes Relative: 6 %
Neutro Abs: 4.1 10*3/uL (ref 1.7–7.7)
Neutrophils Relative %: 53 %
PLATELETS: 616 10*3/uL — AB (ref 150–400)
RBC: 4.1 MIL/uL (ref 3.87–5.11)
RDW: 11.8 % (ref 11.5–15.5)
WBC: 7.6 10*3/uL (ref 4.0–10.5)

## 2015-09-17 LAB — GLUCOSE, CAPILLARY
GLUCOSE-CAPILLARY: 122 mg/dL — AB (ref 65–99)
GLUCOSE-CAPILLARY: 85 mg/dL (ref 65–99)
GLUCOSE-CAPILLARY: 84 mg/dL (ref 65–99)
Glucose-Capillary: 213 mg/dL — ABNORMAL HIGH (ref 65–99)

## 2015-09-17 LAB — CREATININE, SERUM
CREATININE: 0.52 mg/dL (ref 0.44–1.00)
GFR calc non Af Amer: >60 mL/min (ref >60)

## 2015-09-17 MED ORDER — SENNOSIDES-DOCUSATE SODIUM 8.6-50 MG PO TABS
2.0000 | ORAL_TABLET | Freq: Every day | ORAL | Status: DC
  Administered 2015-09-17 – 2015-09-20 (×4): 2 via ORAL
  Filled 2015-09-17 (×4): qty 2

## 2015-09-17 NOTE — Progress Notes (Signed)
Occupational Therapy Session Note  Patient Details  Name: Debra [name] MRN: [id number] Date of Birth: 10/26/1975  Today's Date: 09/17/2015 OT Individual Time: [id number] OT Individual Time Calculation (min): 60 min   Skilled Therapeutic Interventions/Progress Updates:    Upon skilled OT arrival, pt reported being in a "meltdown" regarding discharge planning. Pt reports still not having contact with landlord in regards to home modifications. Pt at this time reports that she plans to reside on 1st level of home without upstairs access for 5-6 months (until she has her prosthetic). Active collaboration in discharge planning occurred to make pt feel safer about discharge if only having access to 1st level. Pt will not have bathroom/shower; pt reported being able to wash up at kitchen sink and having daughters provide setup under sink for towels, soap, and bins. Pt educated on use of BSC for toileting needs. Pt reports planning to use storage bins for clothing items due to financial constraints with purchasing additional furniture. After open discussion, pt reported feeling better about discharge but still agitated. Pt was agreeable to take shower. Right residual limb wrapped. Pt completed shower transfer with BSC and instruction on proper technique and hand placement with steadying assistance. Pt completed UB/LB bathing with supervision. Daughter was present and educated on methods to provide safety cues during bathing. Pt collected clothing items from drawers w/c level with supervision for min cues with w/c safety. UB/LB dressing completed with supervision. Knee extender donned with Min A. Pt left with daughter and nurse at end of session.     Therapy Documentation Precautions:  Precautions Precautions: Fall Precaution Comments: R BKA Restrictions Weight Bearing Restrictions: Yes RUE Weight Bearing: Weight bearing as tolerated LUE Weight Bearing: Weight bearing as tolerated RLE Weight Bearing: Non  weight bearing General:   Vital Signs: Therapy Vitals Pulse Rate: 90 BP: 119/74 mmHg Patient Position (if appropriate): Sitting Pain: Pt reported pain being manageable during session with recovery periods  Pain Assessment Pain Score: 7  Pain Type: Phantom pain Pain Location: Leg Pain Orientation: Right Pain Descriptors / Indicators: Shooting Pain Frequency: Intermittent Pain Onset: Gradual    See Function Navigator for Current Functional Status.   Therapy/Group: Individual Therapy  Noelle Sease A Brad Lieurance 09/17/2015, 12:44 PM

## 2015-09-17 NOTE — Progress Notes (Signed)
PRN robaxin and trazodone given at 2144. Alfredo MartinezMurray, Hazelgrace Bonham A

## 2015-09-17 NOTE — Progress Notes (Signed)
[place] PHYSICAL MEDICINE & REHABILITATION     PROGRESS NOTE  Subjective/Complaints: Pt laying in bed.  She states she did not sleep last night.  She states she took a sleeping pill, but it had "the opposite effect".  She denies stump discomfort.    ROS:  Denies CP, SOB, N/V/D.  Objective: Vital Signs: Blood pressure 101/62, pulse 100, temperature 97.5 F (36.4 C), temperature source Oral, resp. rate 18, weight 102.694 kg (226 lb 6.4 oz), last menstrual period 08/22/2015, SpO2 100 %. No results found.  Recent Labs  09/17/15 0745  WBC 7.6  HGB 12.4  HCT 36.7  PLT 616*   No results for input(s): NA, K, CL, GLUCOSE, BUN, CREATININE, CALCIUM in the last 72 hours.  Invalid input(s): CO CBG (last 3)   Recent Labs  09/16/15 1637 09/16/15 2105 09/17/15 0642  GLUCAP 141* 84 122*    Wt Readings from Last 3 Encounters:  09/10/15 102.694 kg (226 lb 6.4 oz)  09/06/15 116.121 kg (256 lb)  08/29/15 116.121 kg (256 lb)    Physical Exam:  BP 101/62 mmHg  Pulse 100  Temp(Src) 97.5 F (36.4 C) (Oral)  Resp 18  Wt 102.694 kg (226 lb 6.4 oz)  SpO2 100%  LMP 08/22/2015 Constitutional: She appears well-developed and well-nourished.  HENT: Normocephalic and atraumatic.  Eyes: Conjunctivae and EOM are normal.  Cardiovascular: Normal rate and regular rhythm.  Respiratory: Effort normal and breath sounds normal. No respiratory distress. She has no wheezes.  GI: Soft. Bowel sounds are normal. She exhibits no distension. There is no tenderness.  Musculoskeletal: She exhibits mild edema and tenderness.  Neurological: She is alert and oriented to person, place, and time.  Able to follow simple motor commands without difficulty.  Motor: B/l UE, LLE: 5/5 proximal to distal RLE: 4+/5 hip flexion, knee extension  Skin: Skin is warm and dry.  R-BKA with moderate edema, staples c/d/i without erythema.  Psychiatric: Her speech is normal. Thought content normal. Her mood appears  anxious. She is slowed. Cognition and memory are normal.   Assessment/Plan: 1. Functional deficits secondary to right BKA which require 3+ hours per day of interdisciplinary therapy in a comprehensive inpatient rehab setting. Physiatrist is providing close team supervision and 24 hour management of active medical problems listed below. Physiatrist and rehab team continue to assess barriers to discharge/monitor patient progress toward functional and medical goals.  Function:  Bathing Bathing position   Position: Wheelchair/chair at sink  Bathing parts Body parts bathed by patient: Right arm, Left arm, Chest, Abdomen, Front perineal area, Buttocks, Right upper leg, Left upper leg, Right lower leg, Left lower leg, Back Body parts bathed by helper: Back  Bathing assist Assist Level: Supervision or verbal cues      Upper Body Dressing/Undressing Upper body dressing   What is the patient wearing?: Pull over shirt/dress     Pull over shirt/dress - Perfomed by patient: Thread/unthread left sleeve, Thread/unthread right sleeve, Put head through opening, Pull shirt over trunk          Upper body assist Assist Level: Supervision or verbal cues      Lower Body Dressing/Undressing Lower body dressing   What is the patient wearing?: Underwear, Pants, Non-skid slipper socks Underwear - Performed by patient: Thread/unthread right underwear leg, Thread/unthread left underwear leg, Pull underwear up/down   Pants- Performed by patient: Thread/unthread right pants leg, Thread/unthread left pants leg, Pull pants up/down   Non-skid slipper socks- Performed by patient: Don/doff left sock  Lower body assist Assist for lower body dressing: Supervision or verbal cues      Toileting Toileting   Toileting steps completed by patient: Performs perineal hygiene, Adjust clothing after toileting Toileting steps completed by helper: Adjust clothing prior to toileting (urinary  urgency, needed assistance pulling pants down quickl) Toileting Assistive Devices: Grab bar or rail  Toileting assist Assist level: Supervision or verbal cues   Transfers Chair/bed transfer   Chair/bed transfer method: Squat pivot Chair/bed transfer assist level: Supervision or verbal cues Chair/bed transfer assistive device: Armrests     Locomotion Ambulation     Max distance: 4560ft Assist level: Supervision or verbal cues   Wheelchair   Type: Manual Max wheelchair distance: 17050ft Assist Level: Supervision or verbal cues  Cognition Comprehension Comprehension assist level: Follows complex conversation/direction with extra time/assistive device  Expression Expression assist level: Expresses complex ideas: With extra time/assistive device  Social Interaction Social Interaction assist level: Interacts appropriately with others with medication or extra time (anti-anxiety, antidepressant).  Problem Solving Problem solving assist level: Solves basic 90% of the time/requires cueing < 10% of the time  Memory Memory assist level: Recognizes or recalls 90% of the time/requires cueing < 10% of the time    Medical Problem List and Plan: 1.  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  Cont CIR  Stump protector ordered for helping pt keep stump straight, improved stump discomfort 2.  DVT Prophylaxis/Anticoagulation: Pharmaceutical: Lovenox 3. Pain Management: Oxycodone prn.   Neurontin for neuropathy increased to 100 TID.   Re-educated on need to limit narcotics.   4. Mood: LCSW to follow for evaluation and support.   5. Neuropsych: This patient is capable of making decisions on her own behalf. 6. Skin/Wound Care: Monitor wound daily for healing. Maintain adequate nutrition and hydration status.   7. Fluids/Electrolytes/Nutrition: Monitor I/O.   BMP within acceptable range on 7/11 8. HTN:  Will monitor BP bid. Continue lisinopril daily.   9.T2DM: Poorly controlled with A1c-12.1. Has been  on medications for past 6 years and reports compliance.   Changed glucotrol to amaryl as at home.   Continue metformin bid, increased to 1000mg  on 7/12.   Lantus 25U daily.   Novolog 4U TID, increased to 6U TID on 7/12   Will cont to monitor, improved control 10. GERD: Pepcid.   11. PAD: Cont meds 12. Hyponatremia: Resolved  Will cont to monitor 13. Morbid Obesity: Body mass index is 36.73 kg/(m^2) on admission. Diet and exercise education.  Encourage weight loss to increase endurance and promote overall health 14. Tobacco abuse: Counsel  LOS (Days) 7 A FACE TO FACE EVALUATION WAS PERFORMED  Dhanvi Boesen Karis Jubanil Chino Sardo 09/17/2015 9:17 AM

## 2015-09-17 NOTE — Progress Notes (Signed)
Physical Therapy Session Note  Patient Details  Name: Debra [name] MRN: [id number] Date of Birth: 03/20/1975  Today's Date: 09/17/2015 PT Individual Time: 0102-72531452-1517 PT Individual Time Calculation (min): 25 min   Short Term Goals: Week 1:  PT Short Term Goal 1 (Week 1): STG = LTG due to short ELOS.  Skilled Therapeutic Interventions/Progress Updates:    Treatment 1: Pt missed 75 minutes of scheduled PT treatment time (wheelchair group). Pt refusing to participate 2/2 fatigue & nausea.   Treatment 2: Pt received in bed & agreeable to PT with encouragement from therapist. Pt reported 7/10 R residual limb pain but reports being premedicated. Pt transferred supine>sitting EOB with mod I & use of bed features. PT provided total A for re-wrapping R residual limb. Pt with c/o phantom limb pain & PT educated pt on need to gently rub & desensitize R residual limb with pt reporting she has been doing this during the day. Discussed d/c plans with pt & she reports she does plan to d/c home to her house that has 2 levels with bedroom & bathroom upstairs. Educated pt on need to have bed placed downstairs, as she may not qualify for a hospital bed & pt voiced understanding. Discussed that it would be very unsafe for pt to negotiate a full flight of stairs multiple times daily & pt in agreement; pt reports she does not wish to bump up stairs on her buttocks. Educated pt on need for 3-in-1 BSC & sponge baths until pt able to access 2nd level of home safely; also educated pt on recommendation of HHPT following d/c from CIR. Encouraged pt to have someone complete home measurement sheet to ensure home is w/c accessible. At end of session pt left sitting on EOB with all needs within reach, bed alarm set & NT present.   Therapy Documentation Precautions:  Precautions Precautions: Fall Precaution Comments: R BKA Restrictions Weight Bearing Restrictions: Yes RUE Weight Bearing: Weight bearing as tolerated LUE  Weight Bearing: Weight bearing as tolerated RLE Weight Bearing: Non weight bearing  General: PT Amount of Missed Time (min): 75 Minutes PT Missed Treatment Reason: Patient unwilling to participate (2/2 fatigue & nausea)  Pain: Pain Assessment Pain Assessment: 0-10 Pain Score: 7  Pain Location:  (residual limb) Pain Orientation: Right Pain Intervention(s):  (premedicated per pt report)    See Function Navigator for Current Functional Status.   Therapy/Group: Individual Therapy  Sandi MariscalVictoria M Havilah [name] 09/17/2015, 5:21 PM

## 2015-09-17 NOTE — Plan of Care (Signed)
Problem: RH Balance Goal: LTG Patient will maintain dynamic standing balance (PT) LTG: Patient will maintain dynamic standing balance with assistance during mobility activities (PT)  With LRAD: downgrade 2/2 decreased balance  Problem: RH Ambulation Goal: LTG Patient will ambulate in controlled environment (PT) LTG: Patient will ambulate in a controlled environment, # of feet with assistance (PT).  100 ft with LRAD; downgrade 2/2 decreased endurance Goal: LTG Patient will ambulate in home environment (PT) LTG: Patient will ambulate in home environment, # of feet with assistance (PT).  Downgrade 2/2 decreased balance

## 2015-09-17 NOTE — Progress Notes (Signed)
Recreational Therapy Session Note  Patient Details  Name: Debra [name] MRN: [id number] Date of Birth: 05/20/1975 Today's Date: 09/17/2015  Pain: no c/o Skilled Therapeutic Interventions/Progress Updates: Pt participated in animal assisted activity/therapy seated in bed with supervision.  Pt performed bed mobility & dynamic sitting balance tasks while reaching to pet the dog.  Pt smiling and laughing throughout visit.   Therapy/Group: Individual Therapy   Beata Beason 09/17/2015, 3:18 PM

## 2015-09-17 NOTE — Progress Notes (Signed)
Social Work Patient ID: Reece AgarStephanie Bonk, female   DOB: 10/16/1975, 40 y.o.   MRN: [id number] Have received message from pt's apartment manager-Laurie she needs to be given permission form pt before speaking to this worker. Had informed pt this am and she was to contact her one hour ago. Jacki ConesLaurie reports pt's Mother has contacted her and cussed her out regarding signing the lease, therefore pt does not live there yet and is attempting to sign a lease there to move in. She is aware she can not Leave the hospital for a pass to sign one. Will work toward discharge and ask team when ready to go home.

## 2015-09-18 ENCOUNTER — Inpatient Hospital Stay (HOSPITAL_COMMUNITY): Payer: Medicaid Other | Admitting: Physical Therapy

## 2015-09-18 ENCOUNTER — Inpatient Hospital Stay (HOSPITAL_COMMUNITY): Payer: Medicaid Other | Admitting: Occupational Therapy

## 2015-09-18 DIAGNOSIS — F4322 Adjustment disorder with anxiety: Secondary | ICD-10-CM | POA: Insufficient documentation

## 2015-09-18 LAB — CBC WITH DIFFERENTIAL/PLATELET
Basophils Absolute: 0 10*3/uL (ref 0.0–0.1)
Basophils Relative: 0 %
Eosinophils Absolute: 0.1 10*3/uL (ref 0.0–0.7)
Eosinophils Relative: 1 %
HCT: 37.5 % (ref 36.0–46.0)
HEMOGLOBIN: 12.5 g/dL (ref 12.0–15.0)
LYMPHS ABS: 3.7 10*3/uL (ref 0.7–4.0)
LYMPHS PCT: 48 %
MCH: 29.8 pg (ref 26.0–34.0)
MCHC: 33.3 g/dL (ref 30.0–36.0)
MCV: 89.5 fL (ref 78.0–100.0)
Monocytes Absolute: 0.5 10*3/uL (ref 0.1–1.0)
Monocytes Relative: 6 %
NEUTROS PCT: 45 %
Neutro Abs: 3.5 10*3/uL (ref 1.7–7.7)
Platelets: 596 10*3/uL — ABNORMAL HIGH (ref 150–400)
RBC: 4.19 MIL/uL (ref 3.87–5.11)
RDW: 11.8 % (ref 11.5–15.5)
WBC: 7.8 10*3/uL (ref 4.0–10.5)

## 2015-09-18 LAB — GLUCOSE, CAPILLARY
Glucose-Capillary: 109 mg/dL — ABNORMAL HIGH (ref 65–99)
Glucose-Capillary: 143 mg/dL — ABNORMAL HIGH (ref 65–99)
Glucose-Capillary: 85 mg/dL (ref 65–99)

## 2015-09-18 MED ORDER — GABAPENTIN 300 MG PO CAPS
300.0000 mg | ORAL_CAPSULE | Freq: Every day | ORAL | Status: DC
  Administered 2015-09-18 – 2015-09-19 (×2): 300 mg via ORAL
  Filled 2015-09-18 (×2): qty 1

## 2015-09-18 MED ORDER — GABAPENTIN 100 MG PO CAPS
100.0000 mg | ORAL_CAPSULE | Freq: Two times a day (BID) | ORAL | Status: DC
  Administered 2015-09-18 – 2015-09-20 (×5): 100 mg via ORAL
  Filled 2015-09-18 (×5): qty 1

## 2015-09-18 NOTE — Progress Notes (Signed)
Orthopedic Tech Progress Note Patient Details:  Debra [name] 10/02/1975 [id number]  Patient ID: Debra AgarStephanie [name], female   DOB: 08/05/1975, 40 y.o.   MRN: [id number] Called in bio-tech brace order; spoke with Debra [name]  Debra [name] 09/18/2015, 9:28 AM

## 2015-09-18 NOTE — Progress Notes (Signed)
Orthopedic Tech Progress Note Patient Details:  Reece AgarStephanie Seelbach 06/08/1975 [id number] Brace order completed by bio-tech vendor. Patient ID: Reece AgarStephanie Oliva, female   DOB: 05/12/1975, 40 y.o.   MRN: [id number]   Jennye MoccasinHughes, Tarek Cravens Craig 09/18/2015, 3:07 PM

## 2015-09-18 NOTE — Progress Notes (Addendum)
Physical Therapy Note  Patient Details  Name: Debra AgarStephanie Erb MRN: [id number] Date of Birth: 07/26/1975 Today's Date: 09/18/2015    Time: 1115-1145 30 minutes (make up time for missed minutes)  1:1 Pt c/o some residual limb pain during session, pt able to relieve pain with rubbing and tapping residual limb.  Pt performed sidelying and prone therex for bilat LE strengthening hip abductors and extensors. Pt able to lay in prone x 8 minutes for stretching and strengthening.  Pt mod I with w/c mobility and parts management. Transfer training to various furniture with supervision for a variety of surfaces. Pt states she is excited to go home later this week.  Time 2: 1335-1405 30 minutes (make up for missed minutes)  1:1 Pt with no c/o pain.  Pt performed gait training 3650' with RW, c/o hand pain, PT wrapped coban around handrests, pt states this feels more comfortable for gait training.  UBE level 5 x 8 minutes (4 forward, 4 backward). Pt educated by RN and PT about importance of managing diet to prevent further complications from diabetes, pt verbalizes understanding.  DONAWERTH,KAREN 09/18/2015, 11:48 AM

## 2015-09-18 NOTE — Progress Notes (Signed)
Occupational Therapy Session Note  Patient Details  Name: Reece AgarStephanie Duran MRN: [id number] Date of Birth: 10/13/1975  Today's Date: 09/18/2015 OT Individual Time: 7829-56211030-1115 OT Individual Time Calculation (min): 45 min    Short Term Goals: Week 1:  OT Short Term Goal 1 (Week 1): STGs=LTGs  Skilled Therapeutic Interventions/Progress Updates:    Pt seen this session to facilitate safe functional mobility with shower/tub transfers and [facility]. Pt wanted to shower on BSC in walk in shower, set up to simulate a tub bench transfer for home. Pt completed transfer, shower, dressing easily. Worked on stand pivots to [facility] with S. Used the same style BSC she will be using at home. Pt taken to gym to work on HEP of UE exercises.  Pt provided with written exercises. Handoff to PT for next session.  Therapy Documentation Precautions:  Precautions Precautions: Fall Precaution Comments: R BKA Restrictions Weight Bearing Restrictions: Yes RUE Weight Bearing: Weight bearing as tolerated LUE Weight Bearing: Weight bearing as tolerated RLE Weight Bearing: Non weight bearing   Pain: Pain Assessment Pain Assessment: 0-10 Pain Score: 7  (phantom pain) Pain Location: Ankle Pain Orientation: Right Pain Intervention(s): RN made aware   ADL:  See Function Navigator for Current Functional Status.   Therapy/Group: Individual Therapy  Molly Savarino 09/18/2015, 12:59 PM

## 2015-09-18 NOTE — Progress Notes (Signed)
Social Work Patient ID: Debra [name], female   DOB: 22-Sep-1975, 40 y.o.   MRN: [id number]   CSW met with pt to clarify d/c plan and to discuss DME, d/c needs, etc.  Pt's dtr is going through family education today with therapists and pt.  Pt will be at supervision level overall with some modified independence.  Pt will need a rolling walker, w/c, 3-in-1 commode, and tub bench.  Pt's family will move a bed downstairs for her to use on main floor, as pt will be discharging to her own home, not a family member's home.  Pt obtained the rental property in April and reports to this CSW today that her belongings have been moved into the home since that time, but that she and her two dtrs stayed with her mother so that the dtrs could finish their school year in the same schools.  Pt awaits a fax to [place] office from the landlord, Margarita Grizzle, for pt to authorize maintenance going into home to take measurements and to install grab bars.  CSW informed pt that the fax has not arrived and she will call again to ask them to send it through.  CSW gave pt the fax number again.  Dr. Posey Pronto and therapists feel pt can d/c home on Thursday, 09-21-15.  CSW will order DME and home care for pt.

## 2015-09-18 NOTE — Progress Notes (Signed)
Occupational Therapy Session Note  Patient Details  Name: Debra AgarStephanie Kalan MRN: [id number] Date of Birth: 09/19/1975  Today's Date: 09/18/2015 OT Individual Time: 0900-0930 OT Individual Time Calculation (min): 30 min   Short Term Goals: Week 1:  OT Short Term Goal 1 (Week 1): STGs=LTGs  Skilled Therapeutic Interventions/Progress Updates:  Patient found seated in w/c in room with daughter present. Pt with no complaints of direct pain, but some discomfort in right residual limb; monitored this during session. Pt willing and eager to work with therapist. Therapist educated pt on compression wrapping and importance of doing this routinely. Pt stated she has worked on completing her own compression wrapping with other therapists and feels comfortable doing this. Pt propelled self to ADL apartment and therapist went over safety with w/c to/from [facility] transfer. Pt completed transfers with supervision and min verbal cues for safety. Discussed kitchen tasks and pt reports she doesn't plan to cook much post CIR discharge, at least not until she gets her prosthetic. Pt completed 10 sets of w/c pushups. At end of session, left pt in ADL apartment talking with a friend; patient's daughter present to assist her back to her room if needed.   Therapy Documentation Precautions:  Precautions Precautions: Fall Precaution Comments: R BKA Restrictions Weight Bearing Restrictions: Yes RUE Weight Bearing: Weight bearing as tolerated LUE Weight Bearing: Weight bearing as tolerated RLE Weight Bearing: Non weight bearing  Vital Signs: Therapy Vitals Temp: 98.6 F (37 C) Temp Source: Oral Pulse Rate: 87 Resp: 16 BP: 117/62 mmHg Patient Position (if appropriate): Lying Oxygen Therapy SpO2: 99 % O2 Device: Not Delivered  See Function Navigator for Current Functional Status.  Therapy/Group: Individual Therapy  Edwin CapPatricia Dory Demont , MS, OTR/L, CLT  09/18/2015, 10:03 AM

## 2015-09-18 NOTE — Progress Notes (Signed)
Physical Therapy Session Note  Patient Details  Name: Debra [name] MRN: [id number] Date of Birth: 04/08/1975  Today's Date: 09/18/2015 PT Individual Time: 0803-0900 and 1547-1630 PT Individual Time Calculation (min): 57 min and 43 min  Short Term Goals: Week 1:  PT Short Term Goal 1 (Week 1): STG = LTG due to short ELOS.  Skilled Therapeutic Interventions/Progress Updates:    Treatment 1: Pt received in bed & agreeable to PT, noting 7/10 R ankle phantom pain. Educated pt to gently rub R residual limb & RN notified, who administered pain medication. MD present during session & verbally reported pt should wear limb protector at night in bed & during therapies; MD also cleared pt to receive shrinker. PT educated pt on shrinker versus Ace wrap; PT provided total A for re-wrapping R residual limb. Pt able to transfer supine>sit with Mod I & bed features & don pants in sitting/standing independently (supervision A for standing with RW). Educated pt on need to wear limb protector & pt able to don it with supervision A & cuing to adjust tightness of Velcro. Pt able to complete bed>w/c transfer with supervision/mod I with pt choosing to not remove w/c armrest first. Pt propelled w/c throughout session, up to 150 ft with supervision/mod I. Practiced car transfer from sedan & SUV simulated heights with pt performing squat pivot with supervision/mod I. PT provided cuing for pt's hand placement & sequencing. After task pt refused to continue wearing R limb protector & removed it. In rehab apartment pt able to complete squat pivot w/c<>Bed with supervision/mod I and demonstrated bed mobility (rolling L<>R, supine<>sit) with mod I. Discussed home entry with pt who reports she has a single 2 inch step to enter home. Simulated small step with blue foam mat & pt able to complete single hop up small step with RW & supervision. Pt able to return demonstrate task with daughter & steady A. Pt required cuing from  PT/daughter for foot approximation to edge of step. Pt & daughter both report comfort with task. At end of session pt returned to room & left in w/c with all needs within reach.   Educated pt on need to have someone move her bed to first floor as soon as possible and to get home measurements to ensure pt will be able to access her home via w/c; pt voiced understanding.  Treatment 2: Pt received in bed noting 3/10 RLE pain & agreeable to PT. Pt able to transfer out of bed with Mod I & pt already had shrinker donned on RLE. Pt completed squat pivot bed>w/c with supervision/mod I & propelled w/c to gym x 150 ft. Gait training x 100 ft with RW & supervision with pt requiring multiple standing breaks; pt successfully pushed up on BUE to unweight & advance LLE. Pt voiced she has no concerns regarding d/c home. Remainder of session focused on w/c mobility for endurance training with pt able to propel off of unit to gift shop, outside over uneven surfaces, and over ramps with Mod I. Pt able to recall pressure relieving techniques while sitting in w/c but PT cued pt for frequency of task. Re-educated pt on need to maintain R knee extension & pt voiced understanding. At end of session pt left sitting in w/c with all needs within reach & daughter present. During session pt declined wearing R LE protective brace even with education from PT on its importance.  Therapy Documentation Precautions:  Precautions Precautions: Fall Precaution Comments: R BKA Restrictions Weight  Bearing Restrictions: Yes RUE Weight Bearing: Weight bearing as tolerated LUE Weight Bearing: Weight bearing as tolerated RLE Weight Bearing: Non weight bearing  Pain: Pain Assessment Pain Assessment: 0-10 Pain Score: 7  (phantom pain) Pain Location: Ankle Pain Orientation: Right Pain Intervention(s): RN made aware    See Function Navigator for Current Functional Status.   Therapy/Group: Individual Therapy  Sandi Mariscal 09/18/2015, 12:24 PM

## 2015-09-18 NOTE — Progress Notes (Signed)
   VASCULAR SURGERY ASSESSMENT & PLAN:  POD 12 Right BKA  Doing well with CIR  SUBJECTIVE: No complaints.  PHYSICAL EXAM: Filed Vitals:   09/17/15 1047 09/17/15 1426 09/18/15 0607 09/18/15 1344  BP: 119/74 100/50 117/62 107/59  Pulse: 90 103 87 84  Temp:  98.9 F (37.2 C) 98.6 F (37 C) 98 F (36.7 C)  TempSrc:  Oral Oral Oral  Resp:  18 16 18   Weight:      SpO2:  100% 99% 100%   Right BKA inspected and is healing nicely.   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

## 2015-09-18 NOTE — Consult Note (Signed)
  INITIAL DIAGNOSTIC EVALUATION - [facility]   MEDICAL NECESSITY:  Debra [name] [facility] for an initial diagnostic evaluation owing to the patient's diagnosis of BKA.   According to records, Ms. Debra AgarStephanie Kras is a "40 y.o. female with history of T2DM, HTN, ongoing tobacco use, coolness bilateral feet with evidence of PAD who developed progressive gangrenous changes right foot with rest pain and ischemia. She failed conservative treatment.  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 Edilia Boickson. She has associated phantom limb pain. Post op therapy evaluations recommending CIR."  During today's visit, [name] denied experiencing any major cognitive difficulties. She does sometimes wake up and feel disoriented to place but she is quickly re-oriented. Emotionally, she is having a hard time adjusting to the amputation and her sudden loss of independence. She also does not want to feel like a burden on her family, as her daughter will have to assist her with many daily activities for a while. She is also experiencing panic-like symptoms and physical symptoms anxiety. Historically, [name] was reportedly treated for depression when she was in high school secondary to social stressors including family issues (i.e., her parents were drug addicts). She said she is willing to engage in counseling again upon discharge. Suicidal/homicidal ideation, plan or intent was denied. No manic or hypomanic episodes were reported. The patient denied ever experiencing any auditory/visual hallucinations. No major behavioral or personality changes were endorsed.   [name] feels like progress is being made in therapy. No barriers to therapy identified. She described the rehab staff as "wonderful." Her daughter is her biggest source of support; they have only been in [place] for one year. She will purportedly assist with the transfer  home. Vocationally, [name] is in the process of applying for disability benefits.   PROCEDURES: [1 unit 90791] Diagnostic clinical interview  Review of available records   IMPRESSION: Overall, [name] denied experiencing any cognitive deficits and no overt issues were observed. Emotionally, she is primarily suffering from a significant degree of anxious symptomology secondary to her present medical circumstances. For this I recommend a visit from the amputee support group and counseling upon discharge. Her staff physician could consider implementing an antidepressant as well.  Neuropsychology does not plan to formally follow-up with the patient but I will informally check-in with her throughout her stay.   DIAGNOSIS:  Adjustment Disorder with anxious mood     Debbe MountsAdam T. Marlys Stegmaier, Psy.D., ABN Board-Certified Clinical Neuropsychologist

## 2015-09-18 NOTE — Progress Notes (Signed)
[facility]     PROGRESS NOTE  Subjective/Complaints: Pt seen sitting up in bed. She had a better night. She notes comfort with desensitization techniques.   ROS:  Denies CP, SOB, N/V/D.  Objective: Vital Signs: Blood pressure 117/62, pulse 87, temperature 98.6 F (37 C), temperature source Oral, resp. rate 16, weight 102.694 kg (226 lb 6.4 oz), last menstrual period 08/22/2015, SpO2 99 %. No results found.  Recent Labs  09/17/15 0745 09/18/15 0548  WBC 7.6 7.8  HGB 12.4 12.5  HCT 36.7 37.5  PLT 616* 596*    Recent Labs  09/17/15 0745  NA 134*  K 3.9  CL 101  GLUCOSE 134*  BUN 8  CREATININE 0.48  0.52  CALCIUM 9.1   CBG (last 3)   Recent Labs  09/17/15 1619 09/17/15 2119 09/18/15 0620  GLUCAP 85 84 109*    Wt Readings from Last 3 Encounters:  09/10/15 102.694 kg (226 lb 6.4 oz)  09/06/15 116.121 kg (256 lb)  08/29/15 116.121 kg (256 lb)    Physical Exam:  BP 117/62 mmHg  Pulse 87  Temp(Src) 98.6 F (37 C) (Oral)  Resp 16  Wt 102.694 kg (226 lb 6.4 oz)  SpO2 99%  LMP 08/22/2015 Constitutional: She appears well-developed and well-nourished.  HENT: Normocephalic and atraumatic.  Eyes: Conjunctivae and EOM are normal.  Cardiovascular: Normal rate and regular rhythm.  Respiratory: Effort normal and breath sounds normal. No respiratory distress. She has no wheezes.  GI: Soft. Bowel sounds are normal. She exhibits no distension. There is no tenderness.  Musculoskeletal: She exhibits mild edema and tenderness.  Neurological: She is alert and oriented to person, place, and time.  Able to follow simple motor commands without difficulty.  Motor: B/l UE, LLE: 5/5 proximal to distal RLE: 4+/5 hip flexion, knee extension  Skin: Skin is warm and dry.  R-BKA with mild edema, staples c/d/i without erythema.  Psychiatric: Her speech is normal. Thought content normal. Her mood appears anxious. She is slowed. Cognition and  memory are normal.   Assessment/Plan: 1. Functional deficits secondary to right BKA which require 3+ hours per day of interdisciplinary therapy in a comprehensive inpatient rehab setting. Physiatrist is providing close team supervision and 24 hour management of active medical problems listed below. Physiatrist and rehab team continue to assess barriers to discharge/monitor patient progress toward functional and medical goals.  Function:  Bathing Bathing position   Position: Shower  Bathing parts Body parts bathed by patient: Right arm, Left arm, Chest, Abdomen, Front perineal area, Buttocks, Right upper leg, Left upper leg, Left lower leg, Back Body parts bathed by helper: Back  Bathing assist Assist Level: Supervision or verbal cues      Upper Body Dressing/Undressing Upper body dressing   What is the patient wearing?: Pull over shirt/dress     Pull over shirt/dress - Perfomed by patient: Thread/unthread right sleeve, Thread/unthread left sleeve, Put head through opening, Pull shirt over trunk          Upper body assist Assist Level: Supervision or verbal cues      Lower Body Dressing/Undressing Lower body dressing   What is the patient wearing?: Underwear, Pants, Non-skid slipper socks Underwear - Performed by patient: Thread/unthread right underwear leg, Thread/unthread left underwear leg, Pull underwear up/down   Pants- Performed by patient: Thread/unthread right pants leg, Thread/unthread left pants leg, Pull pants up/down   Non-skid slipper socks- Performed by patient: Don/doff left sock  Lower body assist Assist for lower body dressing: Supervision or verbal cues      Toileting Toileting   Toileting steps completed by patient: Performs perineal hygiene, Adjust clothing after toileting Toileting steps completed by helper: Adjust clothing prior to toileting Toileting Assistive Devices: Grab bar or rail  Toileting assist Assist level:  Supervision or verbal cues   Transfers Chair/bed transfer   Chair/bed transfer method: Stand pivot Chair/bed transfer assist level: Supervision or verbal cues C58fair/bed transfer assistive device: Armrests     Locomotion Ambulation     Max distance: 60ft Assist level: Supervision or verbal cues   Wheelchair   Type: Manual Max wheelchair distance: 144ft Assist Level: Supervision or verbal cues  Cognition Comprehension Comprehension assist level: Follows complex conversation/direction with extra time/assistive device  Expression Expression assist level: Expresses complex ideas: With extra time/assistive device  Social Interaction Social Interaction assist level: Interacts appropriately with others with medication or extra time (anti-anxiety, antidepressant).  Problem Solving Problem solving assist level: Solves complex 90% of the time/cues < 10% of the time  Memory Memory assist level: Recognizes or recalls 90% of the time/requires cueing < 10% of the time    Medical Problem List and Plan: 1.  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  Cont CIR  Stump protector ordered for helping pt keep stump straight, improved stump discomfort  Will order stump shrinker 2.  DVT Prophylaxis/Anticoagulation: Pharmaceutical: Lovenox 3. Pain Management: Oxycodone prn.   Neurontin for neuropathy increased to 100 BID, increased to 300 qhs  Re-educated on need to limit narcotics.   4. Mood: LCSW to follow for evaluation and support.   5. Neuropsych: This patient is capable of making decisions on her own behalf. 6. Skin/Wound Care: Monitor wound daily for healing. Maintain adequate nutrition and hydration status.   7. Fluids/Electrolytes/Nutrition: Monitor I/O.   BMP within acceptable range on 7/11 8. HTN:  Will monitor BP bid. Continue lisinopril daily.   9.T2DM: Poorly controlled with A1c-12.1. Has been on medications for past 6 years and reports compliance.   Changed glucotrol to amaryl as at  home.   Continue metformin bid, increased to  on 7/12.   Lantus 25U daily.   Novolog 4U TID, increased to 6U TID on 7/12   Will cont to monitor, overall improved control 10. GERD: Pepcid.   11. PAD: Cont meds 12. Hyponatremia: Resolved  Will cont to monitor 13. Morbid Obesity: Body mass index is 36.73 kg/(m^2) on admission. Diet and exercise education.  Encourage weight loss to increase endurance and promote overall health 14. Tobacco abuse: Counsel  LOS (Days) 8 A FACE TO FACE EVALUATION WAS PERFORMED  Ankit Karis Juba 09/18/2015 8:52 AM

## 2015-09-18 NOTE — Discharge Summary (Signed)
Physician Discharge Summary  Patient ID: Debra [name] MRN: [id number] DOB/AGE: [birth year] 40 y.o.  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   Significant Diagnostic Studies: No results found.  Labs:  Basic Metabolic Panel:  Recent Labs Lab 09/17/15 0745  NA 134*  K 3.9  CL 101  CO2 27  GLUCOSE 134*  BUN 8  CREATININE 0.48  0.52  CALCIUM 9.1    CBC:  Recent Labs Lab 09/17/15 0745 09/18/15 0548  WBC 7.6 7.8  NEUTROABS 4.1 3.5  HGB 12.4 12.5  HCT 36.7 37.5  MCV 89.5 89.5  PLT 616* 596*    CBG:  Recent Labs Lab 09/19/15 1643 09/19/15 2051 09/20/15 0658 09/20/15 1152 09/20/15 1243  GLUCAP 122* 82 93 58* 92    Brief HPI:   Debra [name] is a 40 y.o. female with history of T2DM, HTN, ongoing tobacco use, coolness bilateral feet with evidence of PAD who developed progressive gangrenous changes right foot with rest pain and ischemia. She failed conservative treatment. 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 Scot Dock. She has associated phantom limb pain. CIR recommended for follow up therapy.    Hospital Course: Debra [name] was admitted to rehab 09/10/2015 for inpatient therapies to consist of PT, ST and OT at least three hours five days a week. Past admission physiatrist, therapy team and rehab RN have worked together to provide customized collaborative inpatient rehab.  Gabapentin was added to to help manage neuropathy and has been titrated upwards. Pain has been reasonably controlled with decrease in use of prn oxycodone. Diabetes has been monitored on ac/hs basis and was noted to be poorly controlled.  Metformin was increased to 1000 mg bid and Lantus insulin titrated to 25 units daily in am. GERD has been managed on Pepcid bid. She has been educated on weight loss and exercise to help promote overall health and endurance. She has made good progress and is modified independent at discharge. She will continue to receive follow up [place], [place] and [place] [facility] after discharge.   Rehab course: During patient's stay in rehab weekly team conferences were held to monitor patient's progress, set goals and discuss barriers to discharge. At admission, patient required min assist with mobility and self care tasks. She has had improvement in activity tolerance, balance, postural control, as well as ability to compensate for deficits.  She is able to complete ADL tasks at modified independent level. She is modified independent for transfers and is ambulating 100' with RW and supervision.  Family education was completed with daughter who has been present for most therapy sessions.      Disposition:  Home.   Diet: Diabetic.   Wound Care: Wash incision with soap and water. Pat dry. Keep wound clean and dry . Contact MD if you develop any problems with your incision/wound--redness, swelling, increase in pain, drainage or if you develop fever or chills.   Special Instructions: 1. Check blood sugars 2-3 times a day and record.        Discharge Instructions    Ambulatory referral to Physical Medicine Rehab    Complete by:  As directed  Post hospital follow up/R-BKA            Medication List    STOP taking these medications        glipiZIDE 10 MG tablet  Commonly known as:  GLUCOTROL     nitroGLYCERIN 0.4 mg/hr patch  Commonly known as:  NITRODUR - Dosed in mg/24 hr      TAKE these medications        aspirin 325 MG tablet  Take 1 tablet (325 mg total) by mouth daily.     atorvastatin 20 MG tablet  Commonly known as:  LIPITOR  Take 20 mg by mouth daily.     blood glucose meter  kit and supplies Kit  Dispense based on patient and insurance preference. Use up to four times daily as directed. (FOR ICD-9 250.00, 250.01).     cholecalciferol 1000 units tablet  Commonly known as:  VITAMIN D  Take 1,000 Units by mouth daily.     famotidine 20 MG tablet  Commonly known as:  PEPCID  Take 1 tablet (20 mg total) by mouth 2 (two) times daily.     gabapentin 100 MG capsule  Commonly known as:  NEURONTIN  Take one pill with breakfast and lunch. Take three pills at bedtime.     glimepiride 2 MG tablet  Commonly known as:  AMARYL  Take 1 tablet (2 mg total) by mouth daily with breakfast.     Insulin Glargine 100 UNIT/ML Solostar Pen  Commonly known as:  LANTUS  Inject 25 Units into the skin daily with breakfast.     lisinopril 10 MG tablet  Commonly known as:  PRINIVIL,ZESTRIL  Take 1 tablet (10 mg total) by mouth daily.     metFORMIN 1000 MG tablet  Commonly known as:  GLUCOPHAGE  Take 1 tablet (1,000 mg total) by mouth 2 (two) times daily with a meal.     methocarbamol 500 MG tablet  Commonly known as:  ROBAXIN  Take 1 tablet (500 mg total) by mouth every 6 (six) hours as needed for muscle spasms.     oxyCODONE 5 MG immediate release tablet  Commonly known as:  Oxy IR/ROXICODONE  Take 1 tablet (5 mg total) by mouth 2 (two) times daily as needed for severe pain.     senna-docusate 8.6-50 MG tablet  Commonly known as:  Senokot-S  Take 2 tablets by mouth daily at 12 noon.     traMADol 50 MG tablet  Commonly known as:  ULTRAM  Take 1 tablet (50 mg total) by mouth every 6 (six) hours as needed for moderate pain.     vitamin C 250 MG tablet  Commonly known as:  ASCORBIC ACID  Take 250 mg by mouth daily.       Follow-up Information    Follow up with Deitra Mayo, MD On 10/03/2015.   Specialties:  Vascular Surgery, Cardiology   Why:  APPT @ 11:00 AM   Contact information:   [place] [address] [phone]       Follow up with  Ankit Lorie Phenix, MD.   Specialty:  Physical Medicine and Rehabilitation   Why:  office will call you with follow up appointment   Contact information:   [address] [place] [address] [phone]       Call Arnoldo Morale, MD.   Specialty:  Family Medicine   Why:  next week (09-24-15) for a post hospital follow up appointment   Contact  information:   [place] [address] [phone]       Signed: Bary Leriche 09/20/2015, 5:01 PM

## 2015-09-19 ENCOUNTER — Inpatient Hospital Stay (HOSPITAL_COMMUNITY): Payer: Medicaid Other | Admitting: Physical Therapy

## 2015-09-19 ENCOUNTER — Inpatient Hospital Stay (HOSPITAL_COMMUNITY): Payer: Medicaid Other | Admitting: Occupational Therapy

## 2015-09-19 LAB — GLUCOSE, CAPILLARY
GLUCOSE-CAPILLARY: 114 mg/dL — AB (ref 65–99)
Glucose-Capillary: 145 mg/dL — ABNORMAL HIGH (ref 65–99)
Glucose-Capillary: 83 mg/dL (ref 65–99)
Glucose-Capillary: 122 mg/dL — ABNORMAL HIGH (ref 65–99)
Glucose-Capillary: 82 mg/dL (ref 65–99)

## 2015-09-19 MED ORDER — OXYCODONE HCL 5 MG PO TABS
5.0000 mg | ORAL_TABLET | ORAL | Status: DC | PRN
  Administered 2015-09-19 – 2015-09-20 (×3): 5 mg via ORAL
  Filled 2015-09-19 (×3): qty 1

## 2015-09-19 NOTE — Progress Notes (Signed)
Physical Therapy Session Note  Patient Details  Name: Debra [name] MRN: [id number] Date of Birth: 02/16/1976  Today's Date: 09/19/2015 PT Individual Time: 1002-1100 PT Individual Time Calculation (min): 58 min   Short Term Goals: Week 1:  PT Short Term Goal 1 (Week 1): STG = LTG due to short ELOS.  Skilled Therapeutic Interventions/Progress Updates:    [facility] and agreeable to PT.  Patient performed WC mobility in controlled environment for 11350ftx2 with increased time and effort, but no cues from PT.   Patient performed squat pivot transfer [facility] with min cues for WC parts management. Bed mobility for supine>sit and roll L and R with increased time and effort, but no cues from PT.   PT provided patient with HEP handout including BLE glute sets x 12 with 5 second hold. SAQ x 12 with 3 second hold. Side lying hip abduction x 12 with 3 second hold. Prone hip extension x 12. Qped hip extension on R LE x 10, qped hip abduction on R LE x 12, prone press up to stretch hip flexors x 12 with 3 second hold. Bridge with push through highs x 12.    WC mobility up/down 2510ft ramp with supervision A and min cues for improved control and anterior weight shift to prevent posterior LOB.   PT Educated patient and family member in proper max A ascent of step with WC using anterior approach for ascent and posteroir approach for descent. Mod verbal and visual cues for positioning of  Casters on step and proper height to tip patient to prevent posterior LOB.   Patient returned to room and [facility] with call bell within reach.       Therapy Documentation Precautions:  Precautions Precautions: Fall Precaution Comments: R BKA Restrictions Weight Bearing Restrictions: Yes RUE Weight Bearing: Weight bearing as tolerated LUE Weight Bearing: Weight bearing as tolerated RLE Weight Bearing: Non weight bearing General:   Vital Signs: Therapy  Vitals Temp: 98.7 F (37.1 C) Temp Source: Oral Pulse Rate: (!) 108 Resp: 16 BP: 112/72 mmHg Patient Position (if appropriate): Sitting Oxygen Therapy SpO2: 99 % O2 Device: Not Delivered Pain: Pain Assessment Pain Assessment: 0-10 Pain Score: 5  Pain Type: Surgical pain Pain Location: Leg Pain Orientation: Left Pain Descriptors / Indicators: Aching Pain Frequency: Intermittent Pain Onset: On-going Patients Stated Pain Goal: 2 Pain Intervention(s): Medication (See eMAR);Repositioned Multiple Pain Sites: No Mobility:   Locomotion :    Trunk/Postural Assessment :    Balance:   Exercises:   Other Treatments:     See Function Navigator for Current Functional Status.   Therapy/Group: Individual Therapy  Golden Popustin E Fady Stamps 09/19/2015, 5:50 PM

## 2015-09-19 NOTE — Progress Notes (Signed)
[facility]     PROGRESS NOTE  Subjective/Complaints: Pt sitting up in bed, talking on the phone.  She states she had a better night and feels better after the stump shrinker.   ROS:  Denies CP, SOB, N/V/D.  Objective: Vital Signs: Blood pressure 112/59, pulse 90, temperature 98.7 F (37.1 C), temperature source Oral, resp. rate 16, weight 102.694 kg (226 lb 6.4 oz), last menstrual period 08/22/2015, SpO2 100 %. No results found.  Recent Labs  09/17/15 0745 09/18/15 0548  WBC 7.6 7.8  HGB 12.4 12.5  HCT 36.7 37.5  PLT 616* 596*    Recent Labs  09/17/15 0745  NA 134*  K 3.9  CL 101  GLUCOSE 134*  BUN 8  CREATININE 0.48  0.52  CALCIUM 9.1   CBG (last 3)   Recent Labs  09/18/15 1119 09/18/15 2058 09/19/15 0703  GLUCAP 143* 85 114*    Wt Readings from Last 3 Encounters:  09/10/15 102.694 kg (226 lb 6.4 oz)  09/06/15 116.121 kg (256 lb)  08/29/15 116.121 kg (256 lb)    Physical Exam:  BP 112/59 mmHg  Pulse 90  Temp(Src) 98.7 F (37.1 C) (Oral)  Resp 16  Wt 102.694 kg (226 lb 6.4 oz)  SpO2 100%  LMP 08/22/2015 Constitutional: She appears well-developed and well-nourished.  HENT: Normocephalic and atraumatic.  Eyes: Conjunctivae and EOM are normal.  Cardiovascular: Normal rate and regular rhythm.  Respiratory: Effort normal and breath sounds normal. No respiratory distress. She has no wheezes.  GI: Soft. Bowel sounds are normal. She exhibits no distension. There is no tenderness.  Musculoskeletal: She exhibits mild edema and tenderness.  Neurological: She is alert and oriented to person, place, and time.  Able to follow simple motor commands without difficulty.  Motor: B/l UE, LLE: 5/5 proximal to distal RLE: 4+-5/5 hip flexion, knee extension  Skin: Skin is warm and dry.  R-BKA with mild edema, staples c/d/i without erythema.  Psychiatric: Her speech is normal. Thought content normal. Her mood appears anxious.  She is slowed. Cognition and memory are normal.   Assessment/Plan: 1. Functional deficits secondary to right BKA which require 3+ hours per day of interdisciplinary therapy in a comprehensive inpatient rehab setting. Physiatrist is providing close team supervision and 24 hour management of active medical problems listed below. Physiatrist and rehab team continue to assess barriers to discharge/monitor patient progress toward functional and medical goals.  Function:  Bathing Bathing position   Position: Shower  Bathing parts Body parts bathed by patient: Right arm, Left arm, Chest, Abdomen, Front perineal area, Buttocks, Right upper leg, Left upper leg, Left lower leg, Back Body parts bathed by helper: Back  Bathing assist Assist Level: Supervision or verbal cues      Upper Body Dressing/Undressing Upper body dressing   What is the patient wearing?: Pull over shirt/dress     Pull over shirt/dress - Perfomed by patient: Thread/unthread right sleeve, Thread/unthread left sleeve, Put head through opening, Pull shirt over trunk          Upper body assist Assist Level: Set up      Lower Body Dressing/Undressing Lower body dressing   What is the patient wearing?: Underwear, Pants, Non-skid slipper socks Underwear - Performed by patient: Thread/unthread right underwear leg, Thread/unthread left underwear leg, Pull underwear up/down   Pants- Performed by patient: Thread/unthread right pants leg, Thread/unthread left pants leg, Pull pants up/down   Non-skid slipper socks- Performed by patient: Don/doff left  sock                    Lower body assist Assist for lower body dressing: Set up      Toileting Toileting   Toileting steps completed by patient: Performs perineal hygiene, Adjust clothing after toileting Toileting steps completed by helper: Adjust clothing prior to toileting Toileting Assistive Devices: Grab bar or rail  Toileting assist Assist level: Supervision or  verbal cues   Transfers Chair/bed transfer   Chair/bed transfer method: Squat pivot Chair/bed transfer assist level: Set up only Chair/bed transfer assistive device: Armrests, Bedrails     Locomotion Ambulation     Max distance: 100 ft Assist level: Supervision or verbal cues   Wheelchair   Type: Manual Max wheelchair distance: >300 ft Assist Level: No help, No cues, assistive device, takes more than reasonable amount of time  Cognition Comprehension Comprehension assist level: Follows complex conversation/direction with extra time/assistive device  Expression Expression assist level: Expresses complex ideas: With extra time/assistive device  Social Interaction Social Interaction assist level: Interacts appropriately with others with medication or extra time (anti-anxiety, antidepressant).  Problem Solving Problem solving assist level: Solves complex 90% of the time/cues < 10% of the time  Memory Memory assist level: Recognizes or recalls 90% of the time/requires cueing < 10% of the time    Medical Problem List and Plan: 1.  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  Cont CIR  Stump protector ordered for helping pt keep stump straight, improved stump discomfort  Stump shrinker 2.  DVT Prophylaxis/Anticoagulation: Pharmaceutical: Lovenox 3. Pain Management: Oxycodone prn.   Neurontin for neuropathy increased to 100 BID, increased to 300 qhs  Re-educated on need to limit narcotics.   4. Mood: LCSW to follow for evaluation and support.   5. Neuropsych: This patient is capable of making decisions on her own behalf. 6. Skin/Wound Care: Monitor wound daily for healing. Maintain adequate nutrition and hydration status.   7. Fluids/Electrolytes/Nutrition: Monitor I/O.   BMP within acceptable range on 7/11 8. HTN:  Will monitor BP bid. Continue lisinopril daily.   9.T2DM: Poorly controlled with A1c-12.1. Has been on medications for past 6 years and reports compliance.   Changed  glucotrol to amaryl as at home.   Continue metformin bid, increased to  on 7/12.   Lantus 25U daily.   Novolog 4U TID, increased to 6U TID on 7/12   Will cont to monitor, improved control 10. GERD: Pepcid.   11. PAD: Cont meds 12. Hyponatremia: Resolved  Will cont to monitor 13. Morbid Obesity: Body mass index is 36.73 kg/(m^2) on admission. Diet and exercise education.  Encourage weight loss to increase endurance and promote overall health 14. Tobacco abuse: Counsel  LOS (Days) 9 A FACE TO FACE EVALUATION WAS PERFORMED  Michon Kaczmarek Karis Juba 09/19/2015 7:56 AM

## 2015-09-19 NOTE — Progress Notes (Signed)
Occupational Therapy Session Note  Patient Details  Name: Debra [name] MRN: [id number] Date of Birth: 11/10/1975  Today's Date: 09/19/2015 OT Individual Time: 0900-1000 OT Individual Time Calculation (min): 60 min    Short Term Goals: No short term goals set  Skilled Therapeutic Interventions/Progress Updates:    Pt seen this session for ADL retraining with family education with pt's daughter. Pt practiced a shower in ADL apt using a tub bench. She will not be using the tub immediately at home as pt has a 2 story house, but she wanted to go ahead and have her equipment set up.  Pt's daughter actively involved with session. Pt was S for tub transfers, but mod I to complete all other self care and BSC transfers. Pt states she feels prepared for home and all education for OT is completed.   Therapy Documentation Precautions:  Precautions Precautions: Fall Precaution Comments: R BKA Restrictions Weight Bearing Restrictions: Yes RUE Weight Bearing: Weight bearing as tolerated LUE Weight Bearing: Weight bearing as tolerated RLE Weight Bearing: Non weight bearing  Pain: Pain Assessment Pain Assessment: 0-10 Pain Score: 3  Pain Type: Surgical pain Pain Location: Leg Pain Orientation: Left Pain Descriptors / Indicators: Aching Pain Frequency: Intermittent Pain Onset: With Activity Patients Stated Pain Goal: 2 Pain Intervention(s): Rest Multiple Pain Sites: No ADL:  See Function Navigator for Current Functional Status.   Therapy/Group: Individual Therapy  SAGUIER,JULIA 09/19/2015, 12:34 PM

## 2015-09-19 NOTE — Plan of Care (Signed)
Problem: RH Balance Goal: LTG Patient will maintain dynamic standing balance (PT) LTG: Patient will maintain dynamic standing balance with assistance during mobility activities (PT)  Outcome: Completed/Met Date Met:  09/19/15 With RW  Problem: RH Bed Mobility Goal: LTG Patient will perform bed mobility with assist (PT) LTG: Patient will perform bed mobility with assistance, with/without cues (PT).  Outcome: Completed/Met Date Met:  09/19/15 Regular bed  Problem: RH Bed to Chair Transfers Goal: LTG Patient will perform bed/chair transfers w/assist (PT) LTG: Patient will perform bed/chair transfers with assistance, with/without cues (PT).  Outcome: Completed/Met Date Met:  09/19/15 Via squat pivot  Problem: RH Car Transfers Goal: LTG Patient will perform car transfers with assist (PT) LTG: Patient will perform car transfers with assistance (PT).  Outcome: Completed/Met Date Met:  09/19/15 Squat pivot  Problem: RH Furniture Transfers Goal: LTG Patient will perform furniture transfers w/assist (OT/PT LTG: Patient will perform furniture transfers with assistance (OT/PT).  Outcome: Completed/Met Date Met:  09/19/15 Squat pivot  Problem: RH Ambulation Goal: LTG Patient will ambulate in controlled environment (PT) LTG: Patient will ambulate in a controlled environment, # of feet with assistance (PT).  Outcome: Completed/Met Date Met:  09/19/15 100 ft with RW Goal: LTG Patient will ambulate in home environment (PT) LTG: Patient will ambulate in home environment, # of feet with assistance (PT).  Outcome: Completed/Met Date Met:  09/19/15 50 ft with RW  Problem: RH Wheelchair Mobility Goal: LTG Patient will propel w/c in controlled environment (PT) LTG: Patient will propel wheelchair in controlled environment, # of feet with assist (PT)  Outcome: Completed/Met Date Met:  09/19/15 150 ft  Goal: LTG Patient will propel w/c in community environment (PT) LTG: Patient will propel  wheelchair in community environment, # of feet with assist (PT)  Outcome: Completed/Met Date Met:  09/19/15 150 ft  Problem: RH Stairs Goal: LTG Patient will ambulate up and down stairs w/assist (PT) LTG: Patient will ambulate up and down # of stairs with assistance (PT)  Outcome: Completed/Met Date Met:  09/19/15 12 steps with B rails

## 2015-09-19 NOTE — Progress Notes (Signed)
Physical Therapy Discharge Summary  Patient Details  Name: Debra [name] MRN: [id number] Date of Birth: 12-08-1975  Today's Date: 09/19/2015 PT Individual Time: 0805-0900 PT Individual Time Calculation (min): 55 min    Patient has met 11 of 11 long term goals due to improved activity tolerance, improved balance, improved postural control, increased strength, decreased pain and improved attention.  Patient to discharge at [facility] I w/c level and supervision ambulatory level.   Patient's care partner is independent to provide the necessary physical assistance at discharge.  Reasons goals not met: n/a  Recommendation:  Patient will benefit from ongoing skilled PT services in home health setting to continue to advance safe functional mobility, address ongoing impairments in decreased endurance, decreased RLE strength, for continued gait training & prosthetic fitting, and minimize fall risk.  Equipment: RW, w/c, amputee support pad, 3-in-1 BSC  Reasons for discharge: treatment goals met  Patient/family agrees with progress made and goals achieved: Yes   Skilled PT treatment: Pt received in bed & agreeable to PT, noting 4/10 soreness in BUE. Performed manual testing; please see below for further information. Pt able to perform transfers (car, sit<>stand, squat pivot) all with Mod I, ambulate 60 ft during session with RW & supervision, ambulate over uneven surfaces x 12 ft with RW & supervision, and negotiate 12 steps (6" + 3") with B rails & supervision. Pt is currently at a mod I w/c level and supervision for ambulation with RW. Pt reports no concerns regarding d/c home. Educated pt on furniture transfers & pt able to demonstrate with Mod I. Utilized nu-step level 5 x 5 minutes with pt reporting 13 on Borg RPE scale for endurance training. At end of session pt left in w/c in room with all needs within reach.  Pt refused to wear RLE protective brace even with PT education & encouragement to do  so.  PT Discharge Precautions/Restrictions Precautions Precaution Comments: R BKA Restrictions Weight Bearing Restrictions: Yes RLE Weight Bearing: Non weight bearing  Pain Pain Assessment Pain Assessment: 0-10 Pain Score: 4  Pain Location: Hand Pain Orientation: Right;Left Pain Descriptors / Indicators: Sore Pain Intervention(s): Distraction  Vision/Perception  Pt wears glasses at all times at baseline; pt reports no changes in vision since admission to CIR.   Cognition Overall Cognitive Status: Within Functional Limits for tasks assessed Arousal/Alertness: Awake/alert Orientation Level: Oriented to person;Oriented to place;Oriented to time;Oriented to situation Attention: Focused Memory: Appears intact Awareness: Appears intact Safety/Judgment: Appears intact   Sensation Sensation Light Touch: Impaired by gross assessment (reports tingling in L great toe; impaired sensation RLE) Proprioception: Appears Intact (LLE) Coordination Gross Motor Movements are Fluid and Coordinated: Yes   Mobility Bed Mobility Bed Mobility: Rolling Right;Rolling Left;Supine to Sit;Sit to Supine (regular bed) Rolling Right: 6: Modified independent (Device/Increase time) Rolling Left: 6: Modified independent (Device/Increase time) Supine to Sit: 6: Modified independent (Device/Increase time) Sit to Supine: 6: Modified independent (Device/Increase time) Transfers Transfers: Yes Sit to Stand: 6: Modified independent (Device/Increase time) Stand to Sit: 6: Modified independent (Device/Increase time) Squat Pivot Transfers: 6: Modified independent (Device/Increase time)   Locomotion  Ambulation Ambulation: Yes Ambulation/Gait Assistance: 5: Supervision Ambulation Distance (Feet): 100 Feet (maximum throughout treatment) Assistive device: Rolling walker Gait Gait: Yes Gait Pattern:  (step-to pattern) Stairs / Additional Locomotion Stairs: Yes Stairs Assistance: 5: Supervision Stairs  Assistance Details: Verbal cues for technique Stair Management Technique: Two rails Number of Stairs: 12 Height of Stairs:  (6 inches + 3 inches) Ramp: 6: Modified independent (Device) (  in w/c) Architect: Yes Wheelchair Assistance: 6: Modified independent (Device/Increase time) Environmental health practitioner: Both upper extremities Wheelchair Parts Management: Independent Distance: >200 ft   Balance Balance Balance Assessed: Yes Dynamic Sitting Balance Dynamic Sitting - Level of Assistance: 6: Modified independent (Device/Increase time) Static Standing Balance Static Standing - Balance Support: Bilateral upper extremity supported Static Standing - Level of Assistance: 6: Modified independent (Device/Increase time)   Extremity Assessment  RLE Assessment RLE Assessment: Within Functional Limits (all MMT limited by pain with pressure) RLE Strength Right Hip Flexion: 3+/5 Right Knee Flexion: 3/5 Right Knee Extension: 3+/5 LLE AROM (degrees) Overall AROM Left Lower Extremity: Within functional limits for tasks assessed LLE Strength Left Hip Flexion: 4/5 Left Knee Flexion: 5/5 Left Knee Extension: 4+/5 Left Ankle Dorsiflexion: 4/5   See Function Navigator for Current Functional Status.  Waunita Schooner 09/19/2015, 7:53 AM

## 2015-09-19 NOTE — Progress Notes (Signed)
Recreational Therapy Discharge Summary Patient Details  Name: Debra [name] MRN: [id number] Date of Birth: [birth year] Today's Date: 09/19/2015  Long term goals set: 1  Long term goals met: 1  Comments on progress toward goals: Pt has made good progress during LOS and is ready for discharge home with family at overall Mod I w/c level for simple TR tasks.  Education provided on use of leisure time and making healthy choices in order to maintain & improve overall heatlh & wellness.  Pt needs further reinforcement with this as she laughs about prior habits and makes jokes about her lack of follow through.     Reasons for discharge: discharge from hospital  Patient/family agrees with progress made and goals achieved: Yes  Gerardine Peltz 09/19/2015, 8:50 AM

## 2015-09-19 NOTE — Progress Notes (Signed)
Occupational Therapy Discharge Summary  Patient Details  Name: Debra [name] MRN: [id number] Date of Birth: 1975-07-22   Patient has met 44 of 10 long term goals due to improved activity tolerance and ability to compensate for deficits.  Patient to discharge at overall Modified Independent level.  Patient's care partner is independent to provide the necessary physical assistance at discharge.    Reasons goals not met: n/a  Recommendation:  Patient will benefit from ongoing skilled OT services in home health setting to continue to advance functional skills in the area of BADL and iADL.  Equipment: BSC and tub transfer bench  Reasons for discharge: treatment goals met  Patient/family agrees with progress made and goals achieved: Yes  OT Discharge Precautions/Restrictions  Precautions Precautions: Fall Precaution Comments: R BKA Restrictions Weight Bearing Restrictions: Yes RLE Weight Bearing: Non weight bearing   Pain Pain Assessment Pain Assessment: 0-10 Pain Score: 7  Pain Type: Surgical pain Pain Location: Leg Pain Orientation: Left Pain Descriptors / Indicators: Aching Pain Frequency: Intermittent Pain Onset: With Activity Patients Stated Pain Goal: 2 Pain Intervention(s): Rest Multiple Pain Sites: No ADL  mod I except for S with tub transfers  Vision/Perception  Vision- History Baseline Vision/History: Wears glasses Wears Glasses: At all times Patient Visual Report: No change from baseline Vision- Assessment Vision Assessment?: No apparent visual deficits Perception Comments: WFL  Cognition Overall Cognitive Status: Within Functional Limits for tasks assessed Arousal/Alertness: Awake/alert Orientation Level: Oriented to person;Oriented to place;Oriented to time;Oriented to situation Attention: Focused Memory: Appears intact Awareness: Appears intact Safety/Judgment: Appears intact Sensation Sensation Light Touch: Impaired by gross assessment Light  Touch Impaired Details: Impaired RLE Stereognosis: Appears Intact Hot/Cold: Appears Intact Proprioception: Appears Intact Coordination Gross Motor Movements are Fluid and Coordinated: Yes Fine Motor Movements are Fluid and Coordinated: Yes Motor  Motor Motor: Within Functional Limits Mobility  Bed Mobility Bed Mobility: Rolling Right;Rolling Left;Supine to Sit;Sit to Supine (regular bed) Rolling Right: 6: Modified independent (Device/Increase time) Rolling Left: 6: Modified independent (Device/Increase time) Supine to Sit: 6: Modified independent (Device/Increase time) Sit to Supine: 6: Modified independent (Device/Increase time) Transfers Sit to Stand: 6: Modified independent (Device/Increase time) Stand to Sit: 6: Modified independent (Device/Increase time)  Trunk/Postural Assessment  Cervical Assessment Cervical Assessment: Within Functional Limits Thoracic Assessment Thoracic Assessment: Within Functional Limits Lumbar Assessment Lumbar Assessment: Within Functional Limits Postural Control Postural Control: Within Functional Limits  Balance Balance Balance Assessed: Yes Dynamic Sitting Balance Dynamic Sitting - Level of Assistance: 6: Modified independent (Device/Increase time) Static Standing Balance Static Standing - Balance Support: Bilateral upper extremity supported Static Standing - Level of Assistance: 6: Modified independent (Device/Increase time) Extremity/Trunk Assessment RUE Assessment RUE Assessment: Within Functional Limits LUE Assessment LUE Assessment: Within Functional Limits   See Function Navigator for Current Functional Status.  Clark 09/19/2015, 9:51 AM

## 2015-09-20 LAB — GLUCOSE, CAPILLARY
Glucose-Capillary: 93 mg/dL (ref 65–99)
Glucose-Capillary: 58 mg/dL — ABNORMAL LOW (ref 65–99)
Glucose-Capillary: 92 mg/dL (ref 65–99)

## 2015-09-20 MED ORDER — INSULIN GLARGINE 100 UNIT/ML SOLOSTAR PEN: 25.0000 [IU] | PEN_INJECTOR | Freq: Every day | SUBCUTANEOUS | Status: AC

## 2015-09-20 MED ORDER — TRAMADOL HCL 50 MG PO TABS: 50.0000 mg | ORAL_TABLET | Freq: Four times a day (QID) | ORAL | Status: AC | PRN

## 2015-09-20 MED ORDER — OXYCODONE HCL 5 MG PO TABS: 5.0000 mg | ORAL_TABLET | Freq: Two times a day (BID) | ORAL | Status: AC | PRN

## 2015-09-20 MED ORDER — GABAPENTIN 100 MG PO CAPS: ORAL_CAPSULE | ORAL | Status: AC

## 2015-09-20 MED ORDER — METHOCARBAMOL 500 MG PO TABS: 500.0000 mg | ORAL_TABLET | Freq: Four times a day (QID) | ORAL | Status: AC | PRN

## 2015-09-20 MED ORDER — METFORMIN HCL 1000 MG PO TABS: 1000.0000 mg | ORAL_TABLET | Freq: Two times a day (BID) | ORAL | Status: AC

## 2015-09-20 MED ORDER — BLOOD GLUCOSE MONITOR KIT: PACK | Status: AC

## 2015-09-20 MED ORDER — FAMOTIDINE 20 MG PO TABS: 20.0000 mg | ORAL_TABLET | Freq: Two times a day (BID) | ORAL | Status: AC

## 2015-09-20 MED ORDER — SENNOSIDES-DOCUSATE SODIUM 8.6-50 MG PO TABS: 2.0000 | ORAL_TABLET | Freq: Every day | ORAL | Status: AC

## 2015-09-20 MED ORDER — GLIMEPIRIDE 2 MG PO TABS: 2.0000 mg | ORAL_TABLET | Freq: Every day | ORAL | Status: AC

## 2015-09-20 MED ORDER — LISINOPRIL 10 MG PO TABS: 10.0000 mg | ORAL_TABLET | Freq: Every day | ORAL | Status: AC

## 2015-09-20 NOTE — Progress Notes (Signed)
Pt. And her daughter got d/c instructions and follow appointments.Pt. Ready to go home with her family.

## 2015-09-20 NOTE — Progress Notes (Signed)
[facility] PHYSICAL MEDICINE & REHABILITATION     PROGRESS NOTE  Subjective/Complaints: Pt seen in the dayroom after wheeling herself there for hot cocoa.  She slept well overnight and is ready to be d/ced today.  She states her leg feels better.   ROS:  Denies CP, SOB, N/V/D.  Objective: Vital Signs: Blood pressure 109/60, pulse 87, temperature 98.5 F (36.9 C), temperature source Oral, resp. rate 18, weight 102.241 kg (225 lb 6.4 oz), last menstrual period 08/22/2015, SpO2 100 %. No results found.  Recent Labs  09/18/15 0548  WBC 7.8  HGB 12.5  HCT 37.5  PLT 596*   No results for input(s): NA, K, CL, GLUCOSE, BUN, CREATININE, CALCIUM in the last 72 hours.  Invalid input(s): CO CBG (last 3)   Recent Labs  09/19/15 1643 09/19/15 2051 09/20/15 0658  GLUCAP 122* 82 93    Wt Readings from Last 3 Encounters:  09/20/15 102.241 kg (225 lb 6.4 oz)  09/06/15 116.121 kg (256 lb)  08/29/15 116.121 kg (256 lb)    Physical Exam:  BP 109/60 mmHg  Pulse 87  Temp(Src) 98.5 F (36.9 C) (Oral)  Resp 18  Wt 102.241 kg (225 lb 6.4 oz)  SpO2 100%  LMP 08/22/2015 Constitutional: She appears well-developed and well-nourished.  HENT: Normocephalic and atraumatic.  Eyes: Conjunctivae and EOM are normal.  Cardiovascular: Normal rate and regular rhythm.  Respiratory: Effort normal and breath sounds normal. No respiratory distress. She has no wheezes.  GI: Soft. Bowel sounds are normal. She exhibits no distension. There is no tenderness.  Musculoskeletal: She exhibits mild edema and tenderness.  Neurological: She is alert and oriented to person, place, and time.  Able to follow simple motor commands without difficulty.  Motor: B/l UE, LLE: 5/5 proximal to distal RLE: 5/5 hip flexion, knee extension  Skin: Skin is warm and dry.  R-BKA with mild edema, staples c/d/i without erythema.  Psychiatric: Her speech is normal. Thought content normal. Her mood appears anxious. She is  slowed. (all improving). Cognition and memory are normal.   Assessment/Plan: 1. Functional deficits secondary to right BKA which require 3+ hours per day of interdisciplinary therapy in a comprehensive inpatient rehab setting. Physiatrist is providing close team supervision and 24 hour management of active medical problems listed below. Physiatrist and rehab team continue to assess barriers to discharge/monitor patient progress toward functional and medical goals.  Function:  Bathing Bathing position   Position: Shower  Bathing parts Body parts bathed by patient: Right arm, Left arm, Chest, Abdomen, Front perineal area, Buttocks, Right upper leg, Left upper leg, Left lower leg, Back Body parts bathed by helper: Back  Bathing assist Assist Level: Supervision or verbal cues      Upper Body Dressing/Undressing Upper body dressing   What is the patient wearing?: Pull over shirt/dress     Pull over shirt/dress - Perfomed by patient: Thread/unthread right sleeve, Thread/unthread left sleeve, Put head through opening, Pull shirt over trunk          Upper body assist Assist Level: Set up      Lower Body Dressing/Undressing Lower body dressing   What is the patient wearing?: Underwear, Pants, Non-skid slipper socks Underwear - Performed by patient: Thread/unthread right underwear leg, Thread/unthread left underwear leg, Pull underwear up/down   Pants- Performed by patient: Thread/unthread right pants leg, Thread/unthread left pants leg, Pull pants up/down   Non-skid slipper socks- Performed by patient: Don/doff left sock  Lower body assist Assist for lower body dressing: Set up      Toileting Toileting   Toileting steps completed by patient: Adjust clothing prior to toileting, Performs perineal hygiene, Adjust clothing after toileting Toileting steps completed by helper: Adjust clothing prior to toileting Toileting Assistive Devices: Grab bar or rail   Toileting assist Assist level: More than reasonable time   Transfers Chair/bed transfer   Chair/bed transfer method: Squat pivot Chair/bed transfer assist level: No Help, no cues, assistive device, takes more than a reasonable amount of time Chair/bed transfer assistive device: Armrests     Locomotion Ambulation     Max distance: 60 ft  Assist level: Supervision or verbal cues   Wheelchair   Type: Manual Max wheelchair distance: 150 Assist Level: No help, No cues, assistive device, takes more than reasonable amount of time  Cognition Comprehension Comprehension assist level: Follows basic conversation/direction with no assist  Expression Expression assist level: Expresses basic needs/ideas: With no assist  Social Interaction Social Interaction assist level: Interacts appropriately 75 - 89% of the time - Needs redirection for appropriate language or to initiate interaction.  Problem Solving Problem solving assist level: Solves basic problems with no assist  Memory Memory assist level: Recognizes or recalls 90% of the time/requires cueing < 10% of the time    Medical Problem List and Plan: 1.  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  D/c today  Will see pt for transitional care management in 1-2 weeks.   Stump protector ordered for helping pt keep stump straight, improved stump discomfort  Stump shrinker 2.  DVT Prophylaxis/Anticoagulation: Pharmaceutical: Lovenox 3. Pain Management: Oxycodone prn.   Neurontin for neuropathy increased to 100 BID, increased to 300 qhs  Re-educated on need to limit narcotics.   4. Mood: LCSW to follow for evaluation and support.   5. Neuropsych: This patient is capable of making decisions on her own behalf. 6. Skin/Wound Care: Monitor wound daily for healing. Maintain adequate nutrition and hydration status.   7. Fluids/Electrolytes/Nutrition: Monitor I/O.   BMP within acceptable range on 7/18 8. HTN:  Will monitor BP bid. Continue lisinopril  daily.   9.T2DM: Poorly controlled with A1c-12.1. Has been on medications for past 6 years and reports compliance.   Changed glucotrol to amaryl as at home.   Continue metformin bid, increased to  on 7/12.   Lantus 25U daily.   Novolog 4U TID, increased to 6U TID on 7/12   Will cont to monitor, improved control 10. GERD: Pepcid.   11. PAD: Cont meds 12. Hyponatremia: Resolved  Will cont to monitor 13. Morbid Obesity: Body mass index is 36.73 kg/(m^2) on admission. Diet and exercise education.  Encourage weight loss to increase endurance and promote overall health 14. Tobacco abuse: Counsel  LOS (Days) 10 A FACE TO FACE EVALUATION WAS PERFORMED  Ankit Karis Juba 09/20/2015 8:53 AM

## 2015-09-20 NOTE — Discharge Instructions (Signed)
Inpatient Rehab Discharge Instructions  Reece AgarStephanie Sibilia Discharge date and time:  09/20/15  Activities/Precautions/ Functional Status: Activity: activity as tolerated Diet: diabetic diet Wound Care: Wash incision with soap and water. Pat dry. Keep wound clean and dry . Contact MD if you develop any problems with your incision/wound--redness, swelling, increase in pain, drainage or if you develop fever or chills.    Functional status:  ___ No restrictions     ___ Walk up steps independently ___ 24/7 supervision/assistance   ___ Walk up steps with assistance _X__ Intermittent supervision/assistance  ___ Bathe/dress independently ___ Walk with walker     _X__ Bathe/dress with assistance ___ Walk Independently    ___ Shower independently ___ Walk with assistance    ___ Shower with assistance _X__ No alcohol     ___ Return to work/school ________   COMMUNITY REFERRALS UPON DISCHARGE:    Home Health:   PT     OT    RN                     Agency: [facility] Phone: [phone]    Medical Equipment/Items Ordered:  Wheelchair, cushion, rolling walker, commode and tub bench                                                     Agency/Supplier: [facility] @ [phone]   GENERAL COMMUNITY RESOURCES FOR PATIENT/FAMILY:  Support Groups: Amputee Support Group      Special Instructions: 1. Check blood sugars 2-3 times a day and record.    My questions have been answered and I understand these instructions. I will adhere to these goals and the provided educational materials after my discharge from the hospital.  Patient/Caregiver Signature _______________________________ Date __________  Clinician Signature _______________________________________ Date __________  Please bring this form and your medication list with you to all your follow-up doctor's appointments.

## 2015-09-21 NOTE — Patient Care Conference (Signed)
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 Adjustment disorder with anxious mood   . Sleep disturbance   . Neuropathic pain   . 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 Morbid obesity due to excess calories ([place])   . Abnormality of gait   . Unilateral complete BKA ([place]) 09/10/2015  . Status post below knee amputation of right lower extremity ([place])   . Phantom limb pain ([place])   . Diabetes mellitus type 2 in obese ([place])   . Benign essential HTN   . Tobacco abuse   . PAD (peripheral artery disease) ([place])   . Tachycardia   . Leukocytosis   . Hyponatremia   . Post-operative pain   . Hyperkalemia   . Obesity   . Ischemia of extremity 09/06/2015  . Atherosclerotic peripheral vascular disease ([place]) 05/28/2015  . Abscess and cellulitis 11/05/2014  . Diabetes ([place]) 11/05/2014  . Sepsis ([place]) 11/05/2014    Expected Discharge Date: Expected Discharge Date: 09/20/15  Team Members Present: Physician leading conference: Dr. Delice Lesch Social Worker Present: Lennart Pall, LCSW Nurse Present: Heather Roberts, RN PT Present: Lavone Nian, Rory Percy, PT OT Present: Willeen Cass, OT SLP Present: Gunnar Fusi, SLP PPS Coordinator present : Daiva Nakayama, RN, CRRN     Current Status/Progress Goal Weekly Team Focus  Medical   Gait abnormality secondary to right BKA  Improve mobility, transfers, pain  see  above   Bowel/Bladder   continent of bowel & bladder, lbm 7/17  remain continent, regular bm  monitor   Swallow/Nutrition/ Hydration             ADL's   set up to S with all self care - pt is close to meeting ADL goals  mod I dressing, homemaking; supervison to [facility] and toileting, bathing, tub bench  ADL retraining, functional mobility, pt/family education   Mobility   supervision/mod I for squat pivot transfers & sit<>stand transfers, supervision/mod I for w/c mobility, supervision for ambulation with RW  mod I for transfers, supervision standing balance, supervision for ambulation, mod I for w/c mobility  pt & family education/training, gait training, safety awareness, w/c mobility   Communication             Safety/Cognition/ Behavioral Observations            Pain   Oxy IR q4h & Robaxin q6h PRN effective  <4  monitor   Skin   R bka with staples, unremarkable  free of infection & skin breakdown  monitorq shift & prn    Rehab Goals Patient on target to meet rehab goals: Yes *See Care Plan and progress notes for long and short-term goals.  Barriers to Discharge: Mobility, transfers, pain, DM    Possible Resolutions to Barriers:  Optimize DM and pain meds, therapies    Discharge Planning/Teaching Needs:  Home with teen-aged daughters who  can provide supervision/ light assistance.  Ongoing.   Team Discussion:  Has met all goals;  Education complete and ready for d/c tomorrow.  Revisions to Treatment Plan:  None   Continued Need for Acute Rehabilitation Level of Care: The patient requires daily medical management by a physician with specialized training in physical medicine and rehabilitation for the following conditions: Daily direction of a multidisciplinary physical rehabilitation program to ensure safe treatment while eliciting the highest outcome that is of practical value to the patient.: Yes Daily medical management of patient stability for increased activity during  participation in an intensive rehabilitation regime.: Yes Daily analysis of laboratory values and/or radiology reports with any subsequent need for medication adjustment of medical intervention for : Diabetes problems;Post surgical problems;Mood/behavior problems  Audri Kozub 09/21/2015, 6:48 AM

## 2015-09-23 NOTE — Progress Notes (Signed)
Social Work  Discharge Note  The overall goal for the admission was met for:   Discharge location: Yes - home with family to proide assistance  Length of Stay: Yes - 10 days  Discharge activity level: Yes - mod independent   Home/community participation: Yes  Services provided included: MD, RD, PT, OT, RN, TR, Pharmacy and [place]: Medicaid  Follow-up services arranged: Home Health: RN, PT, OT via [facility], DME: 20x18 lightweight w/c with amp support pad, cushion, walker, 3n1, tub bench via [facility] and Patient/Family has no preference for HH/DME agencies  Comments (or additional information):  Patient/Family verbalized understanding of follow-up arrangements: Yes  Individual responsible for coordination of the follow-up plan: pt  Confirmed correct DME delivered: Zanyah Lentsch 09/23/2015    Kairi Tufo

## 2015-09-24 ENCOUNTER — Telehealth: Payer: Self-pay

## 2015-09-24 NOTE — Telephone Encounter (Signed)
First attempt to call for Transitional Care visit.

## 2015-09-27 NOTE — Telephone Encounter (Signed)
Spoke with pt's nurse. Made aware of appointment on 10/04/15 at 10:40am. Advised nurse for pt to call back regarding TC questions.

## 2015-09-28 ENCOUNTER — Encounter: Payer: Self-pay | Admitting: Vascular Surgery

## 2015-10-03 ENCOUNTER — Encounter: Payer: Medicaid Other | Admitting: Vascular Surgery

## 2015-10-04 ENCOUNTER — Encounter: Payer: Medicaid Other | Attending: Physical Medicine & Rehabilitation | Admitting: Physical Medicine & Rehabilitation

## 2015-11-02 DEATH — deceased

## 2017-06-03 IMAGING — CR DG FOOT COMPLETE 3+V*R*
3 series · 3 of 3 positions shown · non-contrast
Comparison: None.

CLINICAL DATA: Right foot pain and cold feeling since a trip up
[place] and [REDACTED].

EXAM:
RIGHT FOOT COMPLETE - 3+ VIEW

[x foot ap right]
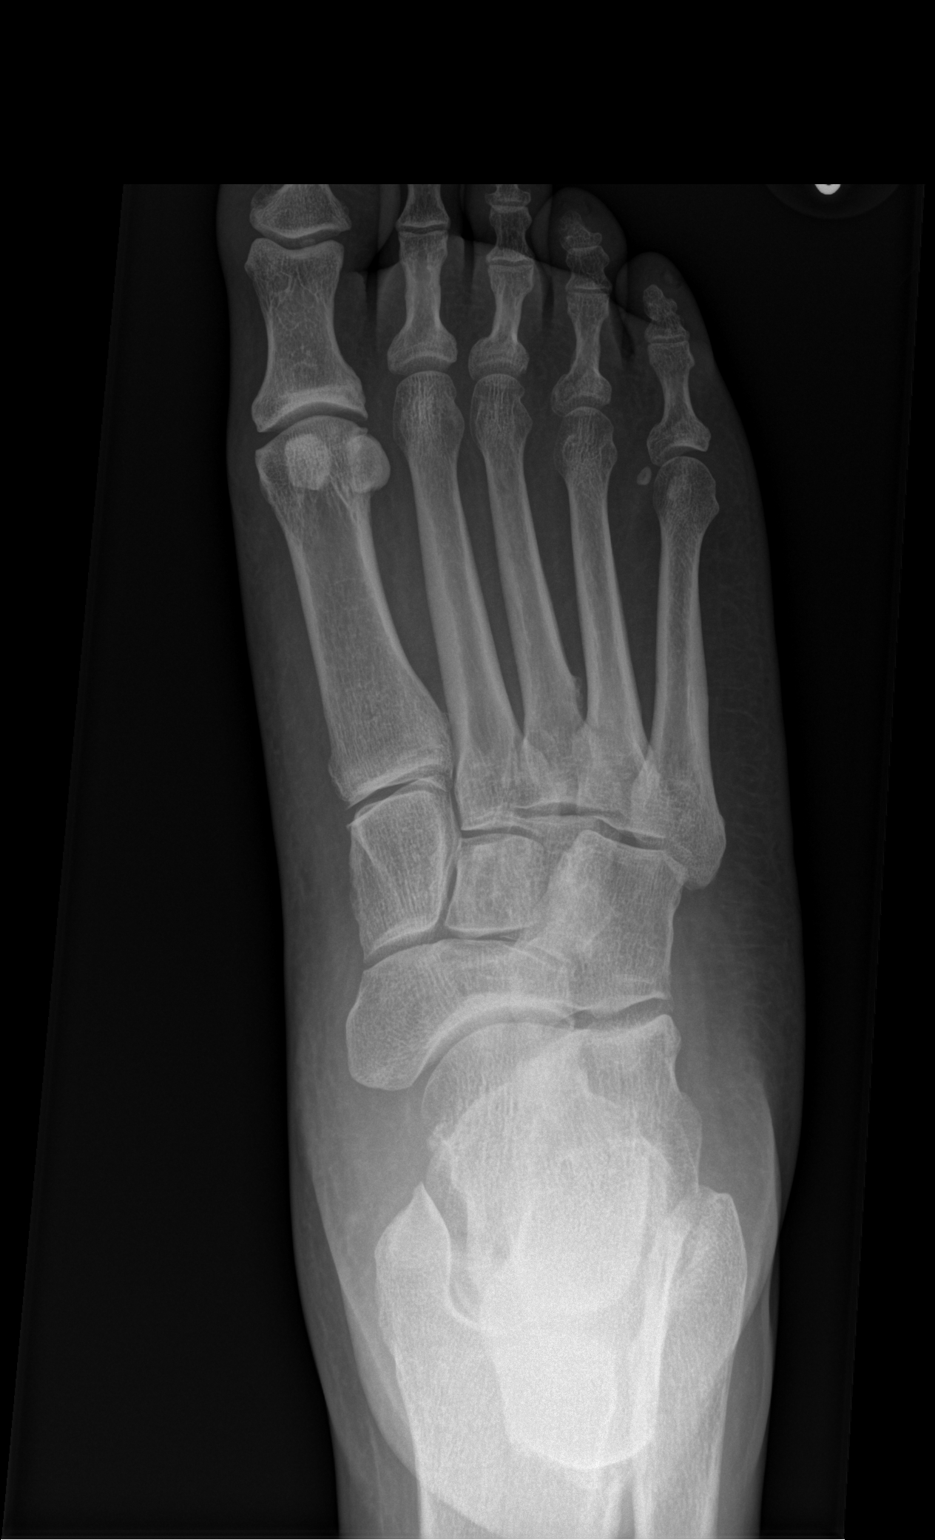

[x foot obl right]
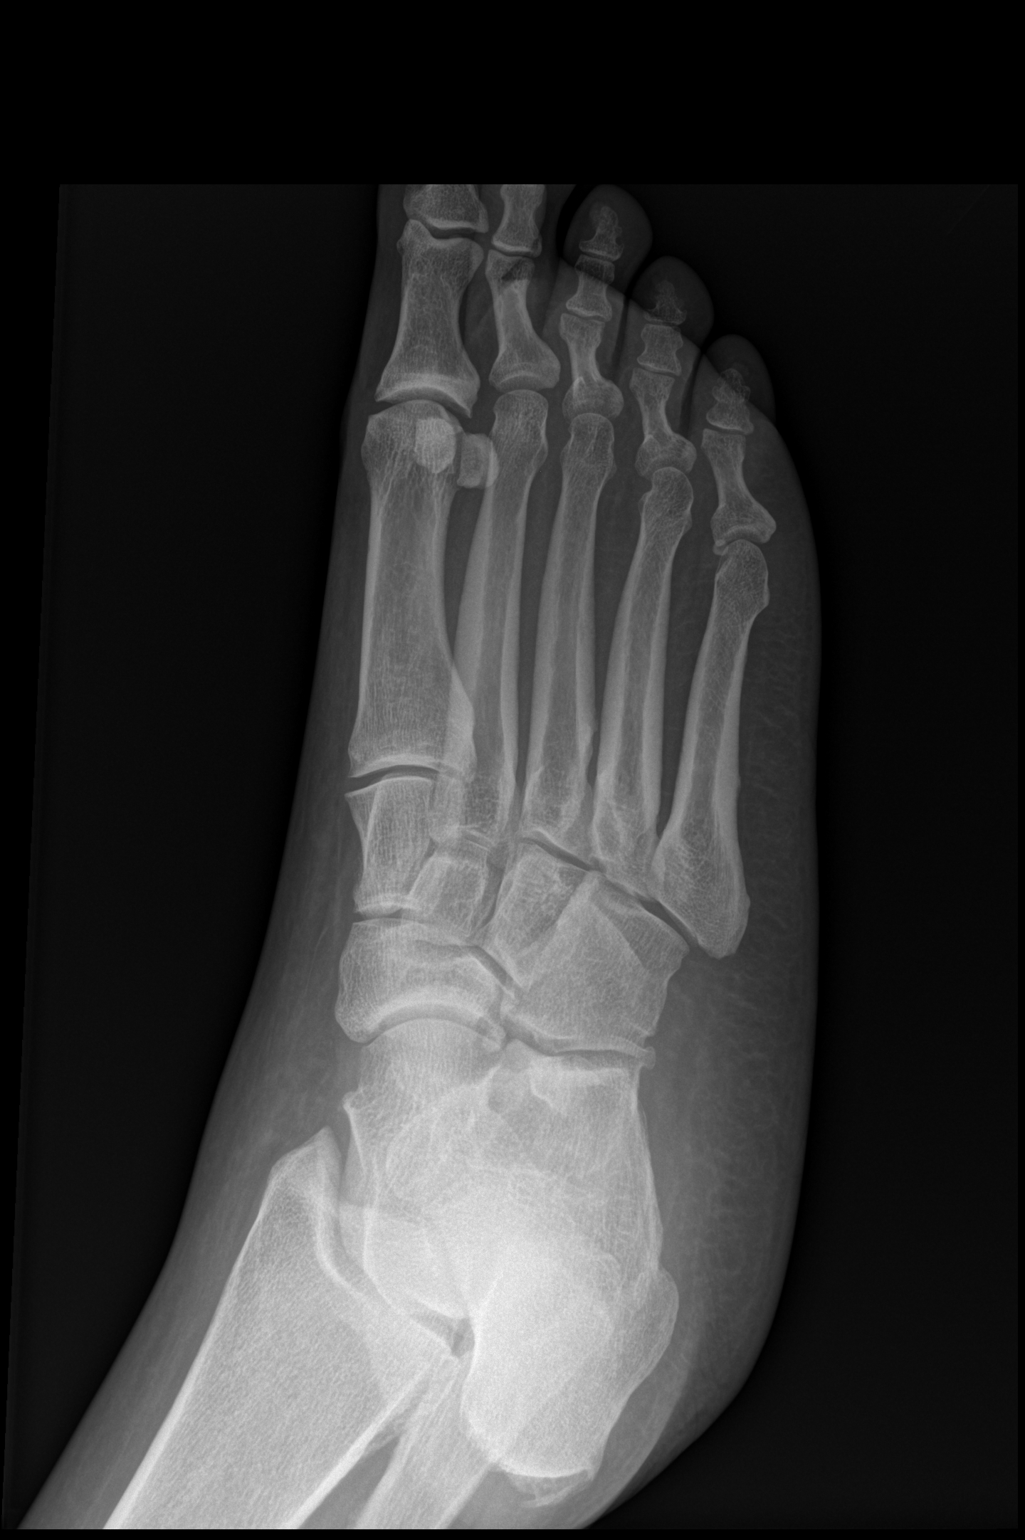

[x foot lat right]
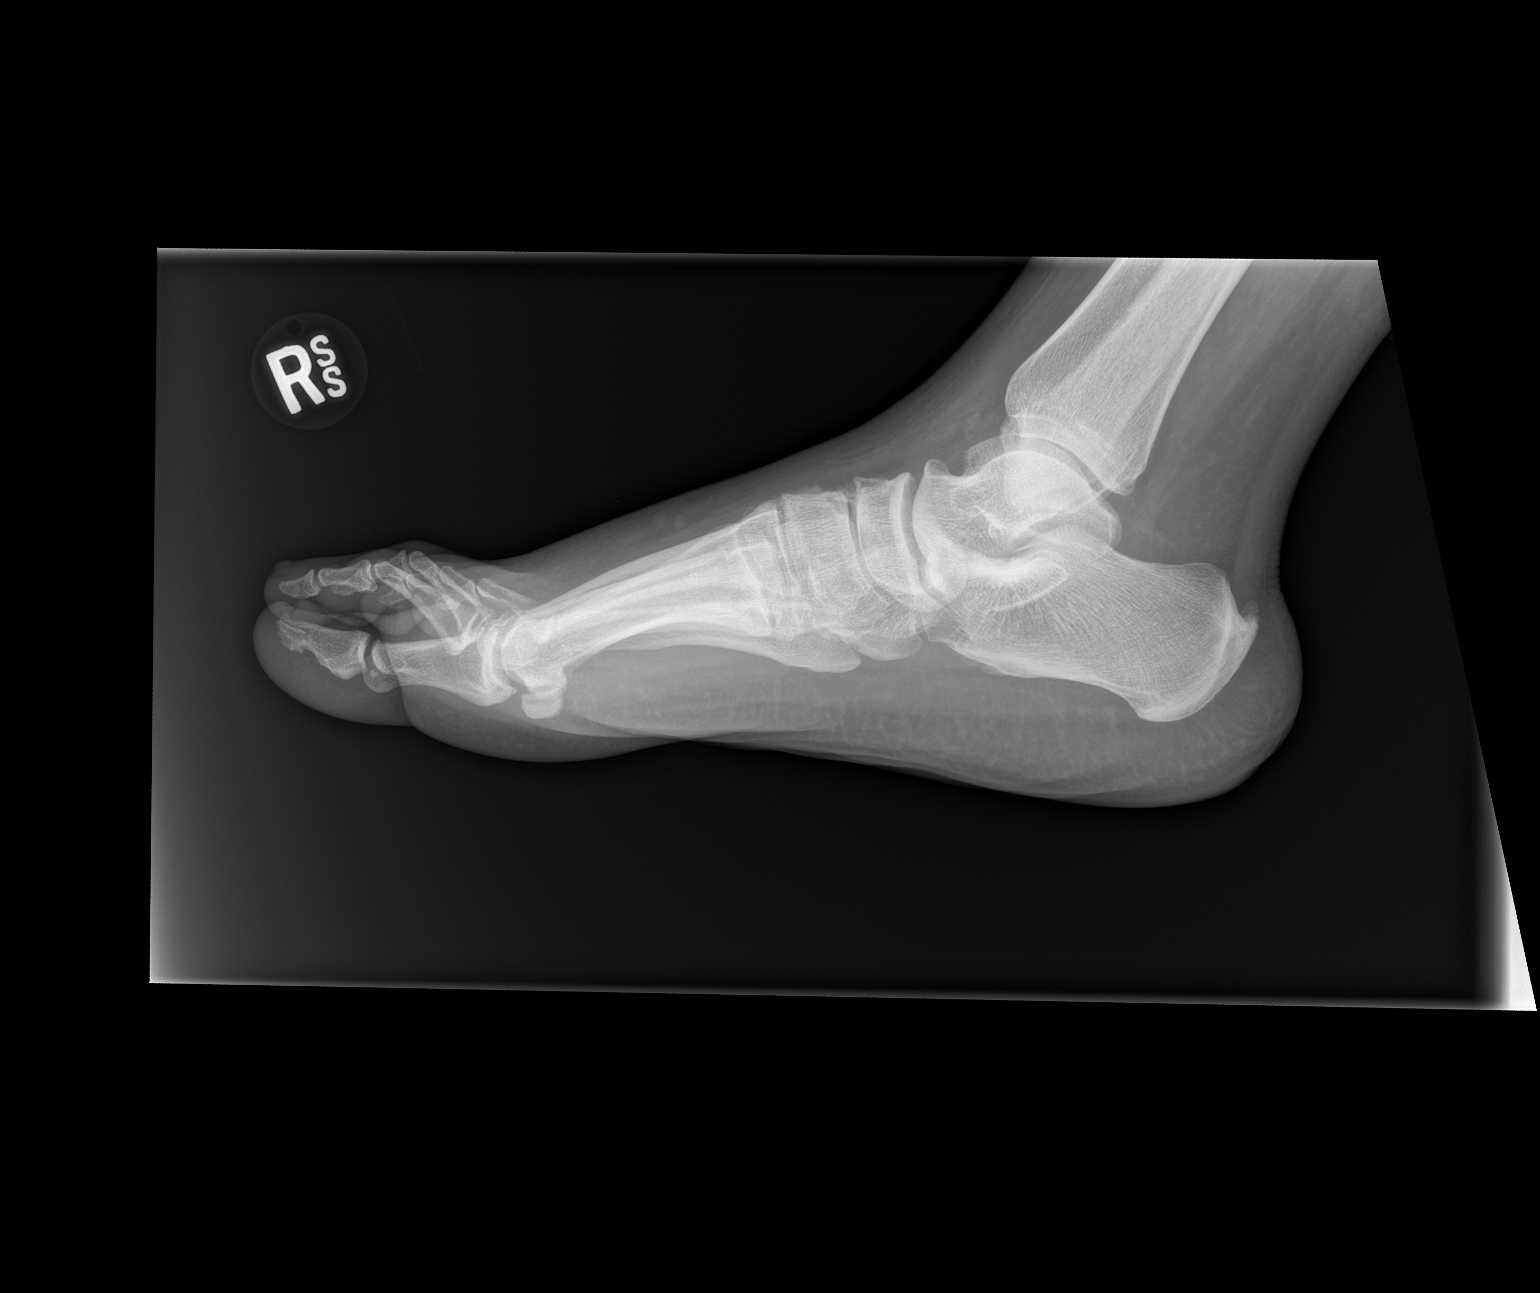

[3 of 3 positions shown; findings below may reference images not displayed]

FINDINGS: Degenerative changes demonstrated in the first metatarsal-phalangeal
joint and multiple tarsometatarsal joints as well as intertarsal
joints. No evidence of acute fracture or dislocation. No focal bone
lesion or bone destruction. Small Achilles calcaneal spur. Soft
tissues are unremarkable.
IMPRESSION: Degenerative changes in the right foot. No acute bony abnormalities.
